# Patient Record
Sex: Female | Born: 1990
Health system: Southern US, Community
[De-identification: ages and names within clinical notes are randomized; demographics above are authoritative.]

## PROBLEM LIST (undated history)

## (undated) DIAGNOSIS — T8859XA Other complications of anesthesia, initial encounter: Secondary | ICD-10-CM

## (undated) DIAGNOSIS — Z8619 Personal history of other infectious and parasitic diseases: Secondary | ICD-10-CM

## (undated) DIAGNOSIS — R599 Enlarged lymph nodes, unspecified: Secondary | ICD-10-CM

## (undated) DIAGNOSIS — I319 Disease of pericardium, unspecified: Secondary | ICD-10-CM

## (undated) DIAGNOSIS — K219 Gastro-esophageal reflux disease without esophagitis: Secondary | ICD-10-CM

## (undated) DIAGNOSIS — E559 Vitamin D deficiency, unspecified: Secondary | ICD-10-CM

## (undated) DIAGNOSIS — Z9889 Other specified postprocedural states: Secondary | ICD-10-CM

## (undated) DIAGNOSIS — Z973 Presence of spectacles and contact lenses: Secondary | ICD-10-CM

## (undated) DIAGNOSIS — M549 Dorsalgia, unspecified: Secondary | ICD-10-CM

## (undated) DIAGNOSIS — M545 Low back pain, unspecified: Secondary | ICD-10-CM

## (undated) DIAGNOSIS — N200 Calculus of kidney: Secondary | ICD-10-CM

## (undated) DIAGNOSIS — G43909 Migraine, unspecified, not intractable, without status migrainosus: Secondary | ICD-10-CM

## (undated) DIAGNOSIS — N979 Female infertility, unspecified: Secondary | ICD-10-CM

## (undated) DIAGNOSIS — N912 Amenorrhea, unspecified: Secondary | ICD-10-CM

## (undated) DIAGNOSIS — Z87442 Personal history of urinary calculi: Secondary | ICD-10-CM

## (undated) DIAGNOSIS — E282 Polycystic ovarian syndrome: Secondary | ICD-10-CM

## (undated) DIAGNOSIS — R591 Generalized enlarged lymph nodes: Secondary | ICD-10-CM

## (undated) HISTORY — DX: Amenorrhea, unspecified: N91.2

## (undated) HISTORY — DX: Migraine, unspecified, not intractable, without status migrainosus: G43.909

## (undated) HISTORY — PX: HYSTEROSCOPY WITH D & C: SHX1775

## (undated) HISTORY — DX: Polycystic ovarian syndrome: E28.2

## (undated) HISTORY — DX: Other complications of anesthesia, initial encounter: T88.59XA

## (undated) HISTORY — DX: Morbid (severe) obesity due to excess calories: E66.01

## (undated) HISTORY — DX: Other specified postprocedural states: Z98.890

## (undated) HISTORY — DX: Disease of pericardium, unspecified: I31.9

## (undated) HISTORY — DX: Dorsalgia, unspecified: M54.9

## (undated) HISTORY — DX: Vitamin D deficiency, unspecified: E55.9

## (undated) HISTORY — DX: Calculus of kidney: N20.0

## (undated) HISTORY — DX: Female infertility, unspecified: N97.9

## (undated) HISTORY — PX: OTHER SURGICAL HISTORY: SHX169

## (undated) HISTORY — DX: Gastro-esophageal reflux disease without esophagitis: K21.9

## (undated) HISTORY — DX: Personal history of other infectious and parasitic diseases: Z86.19

## (undated) HISTORY — PX: WISDOM TOOTH EXTRACTION: SHX21

---

## 2014-10-25 ENCOUNTER — Ambulatory Visit (INDEPENDENT_AMBULATORY_CARE_PROVIDER_SITE_OTHER): Payer: BC Managed Care – PPO | Admitting: Urgent Care

## 2014-10-25 VITALS — BP 130/78 | HR 107 | Temp 98.6°F | Resp 16 | Ht 63.75 in | Wt 230.8 lb

## 2014-10-25 DIAGNOSIS — J029 Acute pharyngitis, unspecified: Secondary | ICD-10-CM

## 2014-10-25 DIAGNOSIS — R000 Tachycardia, unspecified: Secondary | ICD-10-CM

## 2014-10-25 DIAGNOSIS — J309 Allergic rhinitis, unspecified: Secondary | ICD-10-CM

## 2014-10-25 LAB — POCT CBC
Granulocyte percent: 57.1 %G (ref 37–80)
HCT, POC: 45.2 % (ref 37.7–47.9)
Hemoglobin: 14.7 g/dL (ref 12.2–16.2)
Lymph, poc: 1.3 (ref 0.6–3.4)
MCH, POC: 28.2 pg (ref 27–31.2)
MCHC: 32.6 g/dL (ref 31.8–35.4)
MCV: 86.7 fL (ref 80–97)
MID (cbc): 0.4 (ref 0–0.9)
MPV: 6.5 fL (ref 0–99.8)
POC Granulocyte: 2.2 (ref 2–6.9)
POC LYMPH PERCENT: 32.9 %L (ref 10–50)
POC MID %: 10 %M (ref 0–12)
Platelet Count, POC: 205 10*3/uL (ref 142–424)
RBC: 5.22 M/uL (ref 4.04–5.48)
RDW, POC: 13.8 %
WBC: 3.9 10*3/uL — AB (ref 4.6–10.2)

## 2014-10-25 LAB — POCT RAPID STREP A (OFFICE): Rapid Strep A Screen: NEGATIVE

## 2014-10-25 MED ORDER — CETIRIZINE HCL 10 MG PO TABS: 10.0000 mg | ORAL_TABLET | Freq: Every day | ORAL | Status: DC

## 2014-10-25 MED ORDER — TRIAMCINOLONE ACETONIDE 55 MCG/ACT NA AERO: 2.0000 | INHALATION_SPRAY | Freq: Every day | NASAL | Status: DC

## 2014-10-25 NOTE — Patient Instructions (Signed)

## 2014-10-25 NOTE — Progress Notes (Addendum)
MRN: [id number] DOB: 1991-08-21  Subjective:   Jacqueline [name] is a 23 y.o. female presenting for 1 week history of sore/scratchy throat. Patient states that she feels like there is phlegm stuck in her throat. Has had ear fullness, neck soreness/stiffness L>R in the past few days; this morning felt feverish, left ear pain. She has been using Dayquil, Nyquil, Mucinex daily as suggested by her pharmacist, she is concerned now since she is not getting relief and pharmacist suggested she come in to get an antibiotic. Denies sinus congestion, sinus pain, chest pain, chest tightness, cough, shob, wheezing; has had some nausea but no vomiting. Has had 1 sick contact, her boyfriend who has similar symptoms. Denies pmh of seasonal allergies but has used flonase in the past as recommended by her previous PCP, denies hx of asthma. 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 Denies smoking, occasional alcohol drink. Denies any other aggravating or relieving factors, no other questions or concerns.  Jacqueline [name] has a current medication list which includes the following prescription(s): levonorgestrel-ethinyl estradiol.  She has No Known Allergies.  Jacqueline [name]  has no past medical history on file. Also  has no past surgical history on file.  ROS As in subjective.  Objective:   Vitals: BP 130/78 mmHg  Pulse 107  Temp(Src) 98.6 F (37 C) (Oral)  Resp 16  Ht 5' 3.75" (1.619 m)  Wt 230 lb 12.8 oz (104.69 kg)  BMI 39.94 kg/m2  SpO2 98%  LMP 09/25/2014  Physical Exam  Constitutional: She is oriented to person, place, and time and well-developed, well-nourished, and in no distress.  HENT:  TM's pearly and intact bilaterally, no effusions or erythema. Nasal turbinates inflamed and edematous, mild clear rhinorrhea. Mild postnasal drip but without oropharyngeal or tonsillar exudates.  Eyes: Conjunctivae and EOM are normal. Pupils are equal, round, and  reactive to light. Right eye exhibits no discharge. Left eye exhibits no discharge. No scleral icterus.  Neck: Normal range of motion. Neck supple. No thyromegaly present.  Negative Kernig, Brudzinski sign  Cardiovascular: Regular rhythm, normal heart sounds and intact distal pulses.  Tachycardia present.  Exam reveals no gallop and no friction rub.   No murmur heard. Pulmonary/Chest: Effort normal and breath sounds normal. No stridor. No respiratory distress. She has no wheezes. She has no rales. She exhibits no tenderness.  Lymphadenopathy:    She has cervical adenopathy (Left anterior).  Neurological: She is alert and oriented to person, place, and time.  Skin: Skin is warm and dry. No rash noted. She is not diaphoretic. No erythema.  Psychiatric: Mood and affect normal.   Results for orders placed or performed in visit on 10/25/14 (from the past 24 hour(s))  POCT rapid strep A     Status: None   Collection Time: 10/25/14 11:03 AM  Result Value Ref Range   Rapid Strep A Screen Negative Negative  POCT CBC     Status: Abnormal   Collection Time: 10/25/14 11:03 AM  Result Value Ref Range   WBC 3.9 (A) 4.6 - 10.2 K/uL   Lymph, poc 1.3 0.6 - 3.4   POC LYMPH PERCENT 32.9 10 - 50 %L   MID (cbc) 0.4 0 - 0.9   POC MID % 10.0 0 - 12 %M   POC Granulocyte 2.2 2 - 6.9   Granulocyte percent 57.1 37 - 80 %G   RBC 5.22 4.04 - 5.48 M/uL   Hemoglobin 14.7  12.2 - 16.2 g/dL   HCT, POC 16.145.2 09.637.7 - 47.9 %   MCV 86.7 80 - 97 fL   MCH, POC 28.2 27 - 31.2 pg   MCHC 32.6 31.8 - 35.4 g/dL   RDW, POC 04.513.8 %   Platelet Count, POC 205 142 - 424 K/uL   MPV 6.5 0 - 99.8 fL    Assessment and Plan :   1. Sore throat 2. Tachycardia - Not concerning for infectious process, advised to return to clinic if no improvement within 5 days or worsening symptoms - POCT rapid strep A - POCT CBC - Culture, Group A Strep pending  3. Allergic rhinitis, unspecified allergic rhinitis type - Will try to control  allergies for post-nasal drip - triamcinolone (NASACORT AQ) 55 MCG/ACT AERO nasal inhaler; Place 2 sprays into the nose daily.  Dispense: 1 Inhaler; Refill: 12 - cetirizine (ZYRTEC) 10 MG tablet; Take 1 tablet (10 mg total) by mouth daily.  Dispense: 30 tablet; Refill: 11   Wallis BambergMario Mani, PA-C [facility] [phone] 10/25/2014 11:05 AM

## 2014-10-26 ENCOUNTER — Encounter: Payer: Self-pay | Admitting: Urgent Care

## 2014-10-29 LAB — CULTURE, GROUP A STREP

## 2014-10-30 ENCOUNTER — Ambulatory Visit (INDEPENDENT_AMBULATORY_CARE_PROVIDER_SITE_OTHER): Payer: BC Managed Care – PPO | Admitting: Emergency Medicine

## 2014-10-30 VITALS — BP 116/74 | HR 119 | Temp 99.1°F | Resp 20 | Ht 63.0 in | Wt 231.0 lb

## 2014-10-30 DIAGNOSIS — S335XXA Sprain of ligaments of lumbar spine, initial encounter: Secondary | ICD-10-CM

## 2014-10-30 DIAGNOSIS — J0140 Acute pansinusitis, unspecified: Secondary | ICD-10-CM

## 2014-10-30 DIAGNOSIS — L049 Acute lymphadenitis, unspecified: Secondary | ICD-10-CM

## 2014-10-30 MED ORDER — PSEUDOEPHEDRINE-GUAIFENESIN ER 60-600 MG PO TB12
1.0000 | ORAL_TABLET | Freq: Two times a day (BID) | ORAL | Status: DC
Start: 2014-10-30 — End: 2014-11-30

## 2014-10-30 MED ORDER — AMOXICILLIN-POT CLAVULANATE 875-125 MG PO TABS: 1.0000 | ORAL_TABLET | Freq: Two times a day (BID) | ORAL | Status: DC

## 2014-10-30 MED ORDER — NAPROXEN SODIUM 550 MG PO TABS: 550.0000 mg | ORAL_TABLET | Freq: Two times a day (BID) | ORAL | Status: DC

## 2014-10-30 MED ORDER — CYCLOBENZAPRINE HCL 10 MG PO TABS: 10.0000 mg | ORAL_TABLET | Freq: Three times a day (TID) | ORAL | Status: DC | PRN

## 2014-10-30 NOTE — Patient Instructions (Signed)
Sinusitis Sinusitis is redness, soreness, and inflammation of the paranasal sinuses. Paranasal sinuses are air pockets within the bones of your face (beneath the eyes, the middle of the forehead, or above the eyes). In healthy paranasal sinuses, mucus is able to drain out, and air is able to circulate through them by way of your nose. However, when your paranasal sinuses are inflamed, mucus and air can become trapped. This can allow bacteria and other germs to grow and cause infection. Sinusitis can develop quickly and last only a short time (acute) or continue over a long period (chronic). Sinusitis that lasts for more than 12 weeks is considered chronic.  CAUSES  Causes of sinusitis include:  Allergies.  Structural abnormalities, such as displacement of the cartilage that separates your nostrils (deviated septum), which can decrease the air flow through your nose and sinuses and affect sinus drainage.  Functional abnormalities, such as when the small hairs (cilia) that line your sinuses and help remove mucus do not work properly or are not present. SIGNS AND SYMPTOMS  Symptoms of acute and chronic sinusitis are the same. The primary symptoms are pain and pressure around the affected sinuses. Other symptoms include:  Upper toothache.  Earache.  Headache.  Bad breath.  Decreased sense of smell and taste.  A cough, which worsens when you are lying flat.  Fatigue.  Fever.  Thick drainage from your nose, which often is green and may contain pus (purulent).  Swelling and warmth over the affected sinuses. DIAGNOSIS  Your health care provider will perform a physical exam. During the exam, your health care provider may:  Look in your nose for signs of abnormal growths in your nostrils (nasal polyps).  Tap over the affected sinus to check for signs of infection.  View the inside of your sinuses (endoscopy) using an imaging device that has a light attached (endoscope). If your health  care provider suspects that you have chronic sinusitis, one or more of the following tests may be recommended:  Allergy tests.  Nasal culture. A sample of mucus is taken from your nose, sent to a lab, and screened for bacteria.  Nasal cytology. A sample of mucus is taken from your nose and examined by your health care provider to determine if your sinusitis is related to an allergy. TREATMENT  Most cases of acute sinusitis are related to a viral infection and will resolve on their own within 10 days. Sometimes medicines are prescribed to help relieve symptoms (pain medicine, decongestants, nasal steroid sprays, or saline sprays).  However, for sinusitis related to a bacterial infection, your health care provider will prescribe antibiotic medicines. These are medicines that will help kill the bacteria causing the infection.  Rarely, sinusitis is caused by a fungal infection. In theses cases, your health care provider will prescribe antifungal medicine. For some cases of chronic sinusitis, surgery is needed. Generally, these are cases in which sinusitis recurs more than 3 times per year, despite other treatments. HOME CARE INSTRUCTIONS   Drink plenty of water. Water helps thin the mucus so your sinuses can drain more easily.  Use a humidifier.  Inhale steam 3 to 4 times a day (for example, sit in the bathroom with the shower running).  Apply a warm, moist washcloth to your face 3 to 4 times a day, or as directed by your health care provider.  Use saline nasal sprays to help moisten and clean your sinuses.  Take medicines only as directed by your health care provider.    If you were prescribed either an antibiotic or antifungal medicine, finish it all even if you start to feel better. SEEK IMMEDIATE MEDICAL CARE IF:  You have increasing pain or severe headaches.  You have nausea, vomiting, or drowsiness.  You have swelling around your face.  You have vision problems.  You have a stiff  neck.  You have difficulty breathing. MAKE SURE YOU:   Understand these instructions.  Will watch your condition.  Will get help right away if you are not doing well or get worse. Document Released: 10/27/2005 Document Revised: 03/13/2014 Document Reviewed: 11/11/2011 [facility] Patient Information 2015 [place], [place]. This information is not intended to replace advice given to you by your health care provider. Make sure you discuss any questions you have with your health care provider. Swollen Lymph Nodes The lymphatic system filters fluid from around cells. It is like a system of blood vessels. These channels carry lymph instead of blood. The lymphatic system is an important part of the immune (disease fighting) system. When people talk about "swollen glands in the neck," they are usually talking about swollen lymph nodes. The lymph nodes are like the little traps for infection. You and your caregiver may be able to feel lymph nodes, especially swollen nodes, in these common areas: the groin (inguinal area), armpits (axilla), and above the clavicle (supraclavicular). You may also feel them in the neck (cervical) and the back of the head just above the hairline (occipital). Swollen glands occur when there is any condition in which the body responds with an allergic type of reaction. For instance, the glands in the neck can become swollen from insect bites or any type of minor infection on the head. These are very noticeable in children with only minor problems. Lymph nodes may also become swollen when there is a tumor or problem with the lymphatic system, such as Hodgkin's disease. TREATMENT   Most swollen glands do not require treatment. They can be observed (watched) for a short period of time, if your caregiver feels it is necessary. Most of the time, observation is not necessary.  Antibiotics (medicines that kill germs) may be prescribed by your caregiver. Your caregiver may prescribe these if  he or she feels the swollen glands are due to a bacterial (germ) infection. Antibiotics are not used if the swollen glands are caused by a virus. HOME CARE INSTRUCTIONS   Take medications as directed by your caregiver. Only take over-the-counter or prescription medicines for pain, discomfort, or fever as directed by your caregiver. SEEK MEDICAL CARE IF:   If you begin to run a temperature greater than 102 F (38.9 C), or as your caregiver suggests. MAKE SURE YOU:   Understand these instructions.  Will watch your condition.  Will get help right away if you are not doing well or get worse. Document Released: 10/17/2002 Document Revised: 01/19/2012 Document Reviewed: 10/27/2005 [facility] Patient Information 2015 [place], [place]. This information is not intended to replace advice given to you by your health care provider. Make sure you discuss any questions you have with your health care provider.

## 2014-10-30 NOTE — Progress Notes (Signed)
[facility] [address], [place] [address] [phone]  Date:  10/30/2014   Name:  Jacqueline [name]   DOB:  1991-11-07   MRN:  [id number]  PCP:  No PCP Per Patient    Chief Complaint: Follow-up   History of Present Illness:  Jacqueline [name] is a 23 y.o. very pleasant female patient who presents with the following:  Ill for a week with malaise and nasal congestion and a cough that is  not productive Post nasal drainage that is purulent.   There is no wheezing or shortness of breath. Fever with no chills Seen last week and not improved.  Says worse.  Treated for allergies. No nausea or vomiting.  No stool change No rash  Tender nodes.  Night sweats.  No weight loss Has non radiating low back pain with no history of injury or overuse No improvement with over the counter medications or other home remedies.  Denies other complaint or health concern today. .  There are no active problems to display for this patient.   History reviewed. No pertinent past medical history.  History reviewed. No pertinent past surgical history.  History  Substance Use Topics  . Smoking status: Never Smoker   . Smokeless tobacco: Never Used  . Alcohol Use: No    Family History  Problem Relation Age of Onset  . Diabetes Mother   . Hypertension Mother   . Cancer Maternal Grandmother   . Diabetes Paternal Grandmother   . Heart disease Paternal Grandmother   . Heart disease Paternal Grandfather     No Known Allergies  Medication list has been reviewed and updated.  Current Outpatient Prescriptions on File Prior to Visit  Medication Sig Dispense Refill  . levonorgestrel-ethinyl estradiol (AVIANE,ALESSE,LESSINA) 0.1-20 MG-MCG tablet Take 1 tablet by mouth daily.    . cetirizine (ZYRTEC) 10 MG tablet Take 1 tablet (10 mg total) by mouth daily. (Patient not taking: Reported on 10/30/2014) 30 tablet 11  . triamcinolone (NASACORT AQ) 55 MCG/ACT AERO nasal inhaler  Place 2 sprays into the nose daily. (Patient not taking: Reported on 10/30/2014) 1 Inhaler 12   No current facility-administered medications on file prior to visit.    Review of Systems:  As per HPI, otherwise negative.    Physical Examination: Filed Vitals:   10/30/14 1954  BP: 116/74  Pulse: 119  Temp: 99.1 F (37.3 C)  Resp: 20   Filed Vitals:   10/30/14 1954  Height: 5\' 3"  (1.6 m)  Weight: 231 lb (104.781 kg)   Body mass index is 40.93 kg/(m^2). Ideal Body Weight: Weight in (lb) to have BMI = 25: 140.8  GEN: obese , NAD, Non-toxic, A & O x 3 HEENT: Atraumatic, Normocephalic. Neck supple. No masses, moderate left anterior cervical and posterior cervical nodes.  tender Ears and Nose: No external deformity. CV: RRR, No M/G/R. No JVD. No thrill. No extra heart sounds. PULM: CTA B, no wheezes, crackles, rhonchi. No retractions. No resp. distress. No accessory muscle use. ABD: S, NT, ND, +BS. No rebound. No HSM. EXTR: No c/c/e NEURO Normal gait.  PSYCH: Normally interactive. Conversant. Not depressed or anxious appearing.  Calm demeanor.  Back:  Tender lumbar paravertebral region bilaterally.  No ecchymosis   Assessment and Plan: Lymphadenopathy Sinusitis augmentin mucinex d Follow up in two weeks if nodes not resolved Back strain Anaprox Flexeril  Signed,  Phillips OdorJeffery Anderson, MD

## 2014-10-31 ENCOUNTER — Telehealth: Payer: Self-pay

## 2014-10-31 NOTE — Telephone Encounter (Signed)
Patient missed a call and there was no vm left

## 2014-10-31 NOTE — Telephone Encounter (Signed)
No documentation of call made to pt.

## 2014-11-02 ENCOUNTER — Telehealth: Payer: Self-pay

## 2014-11-02 MED ORDER — FLUTICASONE PROPIONATE 50 MCG/ACT NA SUSP: 2.0000 | Freq: Every day | NASAL | Status: DC

## 2014-11-02 NOTE — Telephone Encounter (Signed)
Will switch to fluticasone. Please notify patient that was sent.  Wallis BambergMario Mani, PA-C [facility] [phone] 11/02/2014  2:46 PM

## 2014-11-02 NOTE — Telephone Encounter (Signed)
[facility]ed req to change NS to alternate d/t manufacturer backorder on triamcinolone NS.

## 2014-11-07 ENCOUNTER — Ambulatory Visit (INDEPENDENT_AMBULATORY_CARE_PROVIDER_SITE_OTHER): Payer: BC Managed Care – PPO | Admitting: Emergency Medicine

## 2014-11-07 VITALS — BP 122/76 | HR 94 | Temp 97.7°F | Resp 16 | Ht 64.0 in | Wt 229.4 lb

## 2014-11-07 DIAGNOSIS — R221 Localized swelling, mass and lump, neck: Secondary | ICD-10-CM

## 2014-11-07 LAB — POCT CBC
Granulocyte percent: 18.7 %G — AB (ref 37–80)
HCT, POC: 43.3 % (ref 37.7–47.9)
Hemoglobin: 14 g/dL (ref 12.2–16.2)
Lymph, poc: 12.3 — AB (ref 0.6–3.4)
MCH, POC: 27.9 pg (ref 27–31.2)
MCHC: 32.4 g/dL (ref 31.8–35.4)
MCV: 86 fL (ref 80–97)
MID (cbc): 2.6 — AB (ref 0–0.9)
MPV: 7.4 fL (ref 0–99.8)
POC Granulocyte: 3.4 (ref 2–6.9)
POC LYMPH PERCENT: 37 %L (ref 10–50)
POC MID %: 14.3 %M — AB (ref 0–12)
Platelet Count, POC: 200 10*3/uL (ref 142–424)
RBC: 5.03 M/uL (ref 4.04–5.48)
RDW, POC: 15 %
WBC: 18.4 10*3/uL — AB (ref 4.6–10.2)

## 2014-11-07 LAB — COMPREHENSIVE METABOLIC PANEL
ALT: 134 U/L — ABNORMAL HIGH (ref 0–35)
AST: 100 U/L — ABNORMAL HIGH (ref 0–37)
Albumin: 3.9 g/dL (ref 3.5–5.2)
Alkaline Phosphatase: 185 U/L — ABNORMAL HIGH (ref 39–117)
BUN: 6 mg/dL (ref 6–23)
CO2: 23 mEq/L (ref 19–32)
Calcium: 9.4 mg/dL (ref 8.4–10.5)
Chloride: 102 mEq/L (ref 96–112)
Creat: 0.65 mg/dL (ref 0.50–1.10)
Glucose, Bld: 86 mg/dL (ref 70–99)
Potassium: 4.1 mEq/L (ref 3.5–5.3)
Sodium: 138 mEq/L (ref 135–145)
Total Bilirubin: 0.6 mg/dL (ref 0.2–1.2)
Total Protein: 7.5 g/dL (ref 6.0–8.3)

## 2014-11-07 LAB — CALCIUM: Calcium: 9.4 mg/dL (ref 8.4–10.5)

## 2014-11-07 LAB — LACTATE DEHYDROGENASE: LDH: 665 U/L — ABNORMAL HIGH (ref 94–250)

## 2014-11-07 MED ORDER — HYDROCODONE-ACETAMINOPHEN 5-325 MG PO TABS: 1.0000 | ORAL_TABLET | ORAL | Status: DC | PRN

## 2014-11-07 MED ORDER — ONDANSETRON 8 MG PO TBDP: 8.0000 mg | ORAL_TABLET | Freq: Three times a day (TID) | ORAL | Status: DC | PRN

## 2014-11-07 NOTE — Addendum Note (Signed)
Addended by: Carmelina DaneANDERSON, JEFFERY S on: 11/07/2014 03:31 PM   Modules accepted: Orders

## 2014-11-07 NOTE — Telephone Encounter (Signed)
Pt.notified

## 2014-11-07 NOTE — Progress Notes (Addendum)
[facility] [address], [place] [address] [phone]  Date:  11/07/2014   Name:  Jacqueline [name]   DOB:  06-Sep-1991   MRN:  [id number]  PCP:  No PCP Per Patient    Chief Complaint: Follow-up; Night Sweats; and Swollen lymphnodes   History of Present Illness:  Jacqueline [name] is a 23 y.o. very pleasant female patient who presents with the following:  Seen last week with swollen matted tender left cervical nodes Given augmentin with no improvement Still having night sweats Anorexia.  Nausea but no vomiting Enlarged nodes and increasingly more tender. No difficult swallowing.  Works in pharmacy No cats No fever or chills Grandmother died of NHL at age 25 No improvement with over the counter medications or other home remedies. Denies other complaint or health concern today.   There are no active problems to display for this patient.   History reviewed. No pertinent past medical history.  History reviewed. No pertinent past surgical history.  History  Substance Use Topics  . Smoking status: Never Smoker   . Smokeless tobacco: Never Used  . Alcohol Use: No    Family History  Problem Relation Age of Onset  . Diabetes Mother   . Hypertension Mother   . Cancer Maternal Grandmother   . Diabetes Paternal Grandmother   . Heart disease Paternal Grandmother   . Heart disease Paternal Grandfather     No Known Allergies  Medication list has been reviewed and updated.  Current Outpatient Prescriptions on File Prior to Visit  Medication Sig Dispense Refill  . levonorgestrel-ethinyl estradiol (AVIANE,ALESSE,LESSINA) 0.1-20 MG-MCG tablet Take 1 tablet by mouth daily.    Marland Kitchen. amoxicillin-clavulanate (AUGMENTIN) 875-125 MG per tablet Take 1 tablet by mouth 2 (two) times daily. (Patient not taking: Reported on 11/07/2014) 20 tablet 0  . cetirizine (ZYRTEC) 10 MG tablet Take 1 tablet (10 mg total) by mouth daily. (Patient not taking: Reported on  10/30/2014) 30 tablet 11  . cyclobenzaprine (FLEXERIL) 10 MG tablet Take 1 tablet (10 mg total) by mouth 3 (three) times daily as needed for muscle spasms. (Patient not taking: Reported on 11/07/2014) 30 tablet 0  . fluticasone (FLONASE) 50 MCG/ACT nasal spray Place 2 sprays into both nostrils daily. (Patient not taking: Reported on 11/07/2014) 16 g 11  . naproxen sodium (ANAPROX DS) 550 MG tablet Take 1 tablet (550 mg total) by mouth 2 (two) times daily with a meal. (Patient not taking: Reported on 11/07/2014) 40 tablet 0  . pseudoephedrine-guaifenesin (MUCINEX D) 60-600 MG per tablet Take 1 tablet by mouth every 12 (twelve) hours. (Patient not taking: Reported on 11/07/2014) 18 tablet 0  . triamcinolone (NASACORT AQ) 55 MCG/ACT AERO nasal inhaler Place 2 sprays into the nose daily. (Patient not taking: Reported on 10/30/2014) 1 Inhaler 12   No current facility-administered medications on file prior to visit.    Review of Systems:  As per HPI, otherwise negative.    Physical Examination: Filed Vitals:   11/07/14 1317  BP: 122/76  Pulse: 94  Temp: 97.7 F (36.5 C)  Resp: 16   Filed Vitals:   11/07/14 1317  Height: 5\' 4"  (1.626 m)  Weight: 229 lb 6.4 oz (104.055 kg)   Body mass index is 39.36 kg/(m^2). Ideal Body Weight: Weight in (lb) to have BMI = 25: 145.3   GEN: WDWN, NAD, Non-toxic, Alert & Oriented x 3 HEENT: Atraumatic, Normocephalic.  Ears and Nose: No external deformity. Neck;  Nodes enlarged and  more tender EXTR: No clubbing/cyanosis/edema NEURO: Normal gait.  PSYCH: Normally interactive. Conversant. Not depressed or anxious appearing.  Calm demeanor.    Assessment and Plan: Cervical lymphadenopathy Possible NHL Scan Labs ENT referral  Signed,  Phillips OdorJeffery Anderson, MD

## 2014-11-08 ENCOUNTER — Ambulatory Visit
Admission: RE | Admit: 2014-11-08 | Discharge: 2014-11-08 | Disposition: A | Discharge status: Home / Self Care | Payer: BC Managed Care – PPO | Source: Ambulatory Visit | Attending: Emergency Medicine | Admitting: Emergency Medicine

## 2014-11-08 DIAGNOSIS — R221 Localized swelling, mass and lump, neck: Secondary | ICD-10-CM

## 2014-11-08 NOTE — Addendum Note (Signed)
Addended by: Carmelina DaneANDERSON, JEFFERY S on: 11/08/2014 03:27 PM   Modules accepted: Orders

## 2014-11-10 DIAGNOSIS — Z8619 Personal history of other infectious and parasitic diseases: Secondary | ICD-10-CM

## 2014-11-10 DIAGNOSIS — R599 Enlarged lymph nodes, unspecified: Secondary | ICD-10-CM

## 2014-11-10 HISTORY — DX: Personal history of other infectious and parasitic diseases: Z86.19

## 2014-11-10 HISTORY — DX: Enlarged lymph nodes, unspecified: R59.9

## 2014-11-13 ENCOUNTER — Telehealth: Payer: Self-pay | Admitting: Oncology

## 2014-11-13 NOTE — Telephone Encounter (Signed)
S/W PATIENT AND GAVE NP APPT [facility] 01/12 @ 10:30 W/DR. SHADAD.  REFERRING DR. Thornton Papas DX- NECK MASS

## 2014-11-15 ENCOUNTER — Ambulatory Visit
Admission: RE | Admit: 2014-11-15 | Discharge: 2014-11-15 | Disposition: A | Discharge status: Home / Self Care | Payer: BLUE CROSS/BLUE SHIELD | Source: Ambulatory Visit | Attending: Emergency Medicine | Admitting: Emergency Medicine

## 2014-11-15 DIAGNOSIS — R221 Localized swelling, mass and lump, neck: Secondary | ICD-10-CM

## 2014-11-15 MED ORDER — IOHEXOL 300 MG/ML  SOLN
75.0000 mL | Freq: Once | INTRAMUSCULAR | Status: AC | PRN
  Administered 2014-11-15: 75 mL via INTRAVENOUS

## 2014-11-16 ENCOUNTER — Telehealth: Payer: Self-pay

## 2014-11-16 ENCOUNTER — Other Ambulatory Visit: Payer: Self-pay | Admitting: Emergency Medicine

## 2014-11-16 DIAGNOSIS — R221 Localized swelling, mass and lump, neck: Secondary | ICD-10-CM

## 2014-11-16 NOTE — Telephone Encounter (Signed)
I have called and talked to the patient and answered her questions.

## 2014-11-16 NOTE — Telephone Encounter (Signed)
Jacqueline [name], pt has a lot of questions about her diagnosis. Can you please give her a call when you get a second. I have referrals working on her biopsy now to try and get it done for her tomorrow or Monday. Thanks so much.

## 2014-11-17 ENCOUNTER — Telehealth: Payer: Self-pay

## 2014-11-17 NOTE — Telephone Encounter (Signed)
The patient called to ask about the biopsy that she needs to have done.  She has questions about the procedure.  Please advise.  Thank you.  CB#: [phone]

## 2014-11-18 MED ORDER — LORAZEPAM 1 MG PO TABS: 1.0000 mg | ORAL_TABLET | Freq: Two times a day (BID) | ORAL | Status: DC | PRN

## 2014-11-18 NOTE — Telephone Encounter (Signed)
Patient stated she was returning a call to Constellation EnergyChelle Jeffrey. Patients call back number is [phone]

## 2014-11-18 NOTE — Telephone Encounter (Signed)
Lots of questions about the US guided biopsy of the neck mass scheduled for 11/23/2014 (Thursday).   She sees Dr. Clelia CroftShadad on 11/21/2014 (Tuesday).  Extremely anxious.  Hydrocodone isn't helping, and is causing itching.  D/C Norco. Encouraged her to try the cyclobenzaprine previously ordered. Contact me tomorrow or the next day if she'd like to try oxycodone.  Meds ordered this encounter  Medications  . LORazepam (ATIVAN) 1 MG tablet    Sig: Take 1 tablet (1 mg total) by mouth 2 (two) times daily as needed for anxiety.    Dispense:  20 tablet    Refill:  0    Order Specific Question:  Supervising Provider    Answer:  DOOLITTLE, ROBERT P [[id number]]

## 2014-11-18 NOTE — Telephone Encounter (Signed)
Chelle can you please call this pt when you have a chance. I'm not sure I'll be able to answer her questions. Thanks so much

## 2014-11-20 ENCOUNTER — Telehealth: Payer: Self-pay

## 2014-11-20 ENCOUNTER — Telehealth: Payer: Self-pay | Admitting: Oncology

## 2014-11-20 NOTE — Telephone Encounter (Signed)
PATIENT CALLED TO R/S NP APPT TO 01/21 @ 10:30 W/DR. SHADAD.

## 2014-11-20 NOTE — Telephone Encounter (Signed)
°  FMLA ppw in Dr. Ewell PoeAnderson's box for completion in 5-7 business days. Please return to Disability box at 102 check out once Complete. Jasmine or myself will scan completed forms into epic, fax to [place] [employer], and contact patient.

## 2014-11-21 ENCOUNTER — Ambulatory Visit: Payer: BC Managed Care – PPO | Admitting: Oncology

## 2014-11-21 ENCOUNTER — Other Ambulatory Visit: Payer: BC Managed Care – PPO

## 2014-11-21 ENCOUNTER — Telehealth: Payer: Self-pay

## 2014-11-21 ENCOUNTER — Ambulatory Visit: Payer: BC Managed Care – PPO

## 2014-11-21 NOTE — Telephone Encounter (Signed)
Pt states she still have cough and chest cold, would like to know what else can she do to get rid of it. Please call [phone]     [facility] [address]

## 2014-11-22 ENCOUNTER — Other Ambulatory Visit: Payer: Self-pay | Admitting: Radiology

## 2014-11-22 NOTE — Telephone Encounter (Signed)
Robitussin dm

## 2014-11-22 NOTE — Telephone Encounter (Signed)
Left message for pt to c/b 

## 2014-11-22 NOTE — Telephone Encounter (Signed)
Spoke to pt, she was not evaluated for this at her last OV. i explained to her she will need to RTC for evaluation.  She would like DR. Anderson to review this message for care of cough.  Please asvise

## 2014-11-23 ENCOUNTER — Ambulatory Visit (HOSPITAL_COMMUNITY)
Admission: RE | Admit: 2014-11-23 | Discharge: 2014-11-23 | Disposition: A | Discharge status: Home / Self Care | Payer: BLUE CROSS/BLUE SHIELD | Source: Ambulatory Visit | Attending: Emergency Medicine | Admitting: Emergency Medicine

## 2014-11-23 DIAGNOSIS — R221 Localized swelling, mass and lump, neck: Secondary | ICD-10-CM | POA: Diagnosis not present

## 2014-11-23 MED ORDER — LIDOCAINE HCL (PF) 1 % IJ SOLN
INTRAMUSCULAR | Status: AC
  Filled 2014-11-23: qty 10

## 2014-11-23 NOTE — Procedures (Signed)
US core L cervical LAN 18g x6 to surg path in saline No complication No blood loss. [facility].

## 2014-11-23 NOTE — Telephone Encounter (Signed)
Left detailed message on vm for pt to use as directed

## 2014-11-24 ENCOUNTER — Telehealth: Payer: Self-pay

## 2014-11-24 NOTE — Telephone Encounter (Signed)
As of noon today.  No path results back yet

## 2014-11-24 NOTE — Telephone Encounter (Signed)
Pt called wanting [facility] know [facility] results [facility] She said she had filled out a Release of info. And we could leave a detailed message on her answering machine. Please advise at  [phone]

## 2014-11-24 NOTE — Telephone Encounter (Signed)
Please review. Thanks!  

## 2014-11-24 NOTE — Telephone Encounter (Signed)
Patients states that she called earlier and was on hold for 20 minutes to speak with a doctor and has wasted half of her day trying to get in touch with someone. Patient is very upset and states that she would like for detailed messages to be left on her phone while she is working so that she can know her results. She requested to speak with Dr. Katrinka BlazingSmith but is willing to speak with any doctor. Please call patient! [phone]

## 2014-11-25 NOTE — Telephone Encounter (Signed)
Spoke to the patient and answered questions regarding her biopsy and biopsy site.  She has an appt with oncology on 1/21.

## 2014-11-27 ENCOUNTER — Telehealth: Payer: Self-pay

## 2014-11-27 NOTE — Telephone Encounter (Signed)
Pathology report for lymph node biopsy is back.  Patient has called clinic multiple times today inquiring about results.  Reviewed results with Dr. Nilda SimmerKristi Smith.  Dr. Katrinka BlazingSmith advised it would be ok for me to review these results/recommendations with patient via phone.  While reviewing results with Ms. Corrieri, she claims that she feels the neck swelling is worsening.  [name] is unsure if this is due to the procedure or worsening lymphadenopathy.  Advised Ms. Corrieri I would forward this concern to Dr. Dareen PianoAnderson for advice.

## 2014-11-28 NOTE — Telephone Encounter (Signed)
Spoke with patient. She is doing well. She plans to see oncology on Thursday and will call me if she needs a referral to ENT for further evaluation.  Necks seems more swollen - but no problems with swallowing or breathing.  She does have a slight cold with a cough.

## 2014-11-28 NOTE — Telephone Encounter (Signed)
Apparently the biopsy path report DID NOT get sent to me.   It looks like the needle biopsy was benign. She has an appt with oncology this week  Please check on the ENT appt. That was requested

## 2014-11-28 NOTE — Telephone Encounter (Signed)
Pt refused appt with ENT when they called to schedule the appt.

## 2014-11-30 ENCOUNTER — Ambulatory Visit: Payer: BLUE CROSS/BLUE SHIELD

## 2014-11-30 ENCOUNTER — Encounter: Payer: Self-pay | Admitting: Oncology

## 2014-11-30 ENCOUNTER — Telehealth: Payer: Self-pay | Admitting: Oncology

## 2014-11-30 ENCOUNTER — Other Ambulatory Visit: Payer: Self-pay

## 2014-11-30 ENCOUNTER — Ambulatory Visit (HOSPITAL_BASED_OUTPATIENT_CLINIC_OR_DEPARTMENT_OTHER): Payer: BLUE CROSS/BLUE SHIELD | Admitting: Oncology

## 2014-11-30 VITALS — BP 123/61 | HR 62 | Temp 97.6°F | Resp 20 | Ht 63.5 in | Wt 233.3 lb

## 2014-11-30 DIAGNOSIS — D72820 Lymphocytosis (symptomatic): Secondary | ICD-10-CM

## 2014-11-30 DIAGNOSIS — R591 Generalized enlarged lymph nodes: Secondary | ICD-10-CM

## 2014-11-30 DIAGNOSIS — R599 Enlarged lymph nodes, unspecified: Secondary | ICD-10-CM

## 2014-11-30 NOTE — Progress Notes (Signed)
Please see consult note.  

## 2014-11-30 NOTE — Progress Notes (Signed)
Checked in new pt with no financial concerns prior to seeing the dr.  Pt has Raquel's card for any billing or insurance questions or concerns.  ° °

## 2014-11-30 NOTE — Telephone Encounter (Signed)
Gave avs & calendar for February. See referral notes. °

## 2014-11-30 NOTE — Consult Note (Signed)
Reason for Referral: Lymphadenopathy.   HPI: 24 year old woman native of [place] but currently lives in [place]. 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 She has a history of PCOS otherwise really no significant comorbid conditions. She was in her usual state of health until she presented with neck pain and lymphadenopathy. She also had a symptoms of sore throat and recurrent fevers. She was treated symptomatically with antibiotics and subsequently allergy medication without any resolution of her symptoms. She was evaluated at urgent care and underwent a CT scan of the neck which was obtained and and showed diffuse bilateral cervical lymphadenopathy. The largest individual lymph node measuring 19 mm in short axis of the left side. Right level lymph node measuring 12 mm. She underwent a percutaneous biopsy on 11/23/2014 without specific diagnosis made. The final pathology showed benign lymphoid tissues. Patient referred to me for further evaluation. Clinically, she is continuing to be symptomatic. She is reporting continues to have fluctuating enlarged bilateral lymphadenopathy. She is reporting low-grade fevers and sweats. She has some weight loss close to 5 pounds on decline in her by mouth intake. She will also have noted increase back pain throughout her spine upper and mid to lower spine. She did not report any neurological deficits. She did not report any changes in her bowel habits or urinary symptoms. 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 She still able to drive short distances and attends to activities of daily living. She lives alone and her family lives in [place]. She has a very close contact at lives in the [place] area.  She is not report any headaches, blurry vision, syncope or seizures. She does not report any other constitutional  symptoms she does report occasional pruritus. She does not report any chest pain, palpitation, orthopnea, leg edema. She does not report any shortness of breath, wheezing or hemoptysis. She does report nonproductive cough. She does not report any nausea, vomiting, abdominal pain, early satiety, constipation, diarrhea, hematochezia, melena or any changes in bowel habits. She did report some discoloration of her urine but has not reported any hematuria, dysuria or incontinence. She does not report any neurological deficits such as neuropathy or upper extremity weakness. She does not report any petechiae or easy bruisability. She does report occasional anxiety but no depression. Rest of her review of systems unremarkable.   Past Medical History  Diagnosis Date  . PCOS (polycystic ovarian syndrome)   :    Current outpatient prescriptions:  .  HYDROcodone-acetaminophen (NORCO/VICODIN) 5-325 MG per tablet, Take 1-2 tablets by mouth every 4 (four) hours as needed for moderate pain. Filled by Dr. Synetta Shadow, Disp: , Rfl:  .  levonorgestrel-ethinyl estradiol (AVIANE,ALESSE,LESSINA) 0.1-20 MG-MCG tablet, Take 1 tablet by mouth daily., Disp: , Rfl:  .  Naproxen (NAPROSYN PO), Take 1 tablet by mouth 2 (two) times daily as needed. Uses OTC Naprosyn., Disp: , Rfl:  .  ondansetron (ZOFRAN-ODT) 8 MG disintegrating tablet, Take 1 tablet (8 mg total) by mouth every 8 (eight) hours as needed for nausea., Disp: 30 tablet, Rfl: 0:  No Known Allergies:  Family History  Problem Relation Age of Onset  . Diabetes Mother   . Hypertension Mother   . Cancer Maternal Grandmother   . Diabetes Paternal Grandmother   . Heart disease Paternal Grandmother   . Heart disease Paternal Grandfather   :  History   Social History  . Marital Status: Single    Spouse Name: N/A    Number of Children: N/A  . Years of Education: N/A   Occupational History  . Not on file.   Social History Main Topics  . Smoking  status: Never Smoker   . Smokeless tobacco: Never Used  . Alcohol Use: No  . Drug Use: No  . Sexual Activity: Not on file   Other Topics Concern  . Not on file   Social History Narrative  :  Pertinent items are noted in HPI.  Exam: Blood pressure 123/61, pulse 62, temperature 97.6 F (36.4 C), temperature source Oral, resp. rate 20, height 5' 3.5" (1.613 m), weight 233 lb 4.8 oz (105.824 kg). General appearance: alert and cooperative Head: Normocephalic, without obvious abnormality Throat: lips, mucosa, and tongue normal; teeth and gums normal Neck: Palpable adenopathy noted bilaterally more left than right. Back: negative Resp: clear to auscultation bilaterally Chest wall: no tenderness Cardio: regular rate and rhythm, S1, S2 normal, no murmur, click, rub or gallop GI: soft, non-tender; bowel sounds normal; no masses,  no organomegaly Extremities: extremities normal, atraumatic, no cyanosis or edema Pulses: 2+ and symmetric Skin: Skin color, texture, turgor normal. No rashes or lesions Lymph nodes: Fullness noted bilateral axillary areas. Could not appreciate any inguinal adenopathy.  CBC    Component Value Date/Time   WBC 18.4* 11/07/2014 1516   RBC 5.03 11/07/2014 1516   HGB 14.0 11/07/2014 1516   HCT 43.3 11/07/2014 1516   MCV 86.0 11/07/2014 1516   MCH 27.9 11/07/2014 1516   MCHC 32.4 11/07/2014 1516      Chemistry      Component Value Date/Time   NA 138 11/07/2014 1453   K 4.1 11/07/2014 1453   CL 102 11/07/2014 1453   CO2 23 11/07/2014 1453   BUN 6 11/07/2014 1453   CREATININE 0.65 11/07/2014 1453      Component Value Date/Time   CALCIUM 9.4 11/07/2014 1453   CALCIUM 9.4 11/07/2014 1453   ALKPHOS 185* 11/07/2014 1453   AST 100* 11/07/2014 1453   ALT 134* 11/07/2014 1453   BILITOT 0.6 11/07/2014 1453      Ct Soft Tissue Neck W Contrast  11/15/2014   CLINICAL DATA:  24 year old female with palpable neck lymph nodes despite treatment with  antibiotics. Pain deep palpation. Concern for lymphoma. Initial encounter.  EXAM: CT NECK WITH CONTRAST  TECHNIQUE: Multidetector CT imaging of the neck was performed using the standard protocol following the bolus administration of intravenous contrast.  CONTRAST:  19m OMNIPAQUE IOHEXOL 300 MG/ML  SOLN  COMPARISON:  Neck ultrasound 11/08/2014.  FINDINGS: Pharynx and larynx: Negative; symmetric appearing adenoid and tonsillar soft tissues are favored to be physiologic in this age group. Parapharyngeal, retropharyngeal, and sublingual spaces are within normal limits.  Salivary glands: Negative.  Thyroid: Negative.  Lymph nodes: Abnormally increased in number at all cervical lymph node stations bilaterally. Furthermore many nodes are abnormally enlarged, abnormally rounded, or both. The largest individual nodes measure up to 19 mm short axis at the left level 2 a station. Right level 2 nodes measure 12 mm short axis individually. Increased bilateral parotid space nodes also are noted. The lymphadenopathy abates at the thoracic inlet, but there is evidence of increased bilateral axillary nodes albeit smaller than those in the neck.  Vascular: Normal.  Limited intracranial: Negative.  Mastoids and visualized paranasal sinuses: Minimal to mild mucosal thickening.  Skeleton: Negative.  Upper chest: Increased  bilateral axillary nodes, also described above. No superior mediastinal lymphadenopathy. Negative visualized lung parenchyma.  IMPRESSION: Diffuse bilateral cervical lymphadenopathy most compatible with acute lymphoma.  Recommend histologic correlation, and ultrasound guided percutaneous lymph node biopsy should be feasible.  These results will be called to the ordering clinician or representative by the Radiologist Assistant, and communication documented in the PACS or zVision Dashboard.   Electronically Signed   By: Lars Pinks M.D.   On: 11/15/2014 15:32      Assessment and Plan:   24 year old woman with the  following issues:  1. Lymphadenopathy noted in the cervical area and documented by CT scan of the neck on 11/15/2014. Her cervical lymphadenopathy is very suspicious for lymphoma. Fine-needle aspiration on 11/23/2014 failed to show any specific diagnosis. She does have constitutional symptoms of fevers, chills or sweats and pruritus. The differential diagnosis was discussed today with the patient and her aunt that accompanied her today. Lymphoma is the most likely diagnosis and he is to be rule out urgently. Given her age and presentation Hodgkin's disease is a very distinct possibility. Other possibilities would include viral infections such as EBV or CMV. HIV is less likely. Other granulomatous disease and reactive lymphadenopathy is also a consideration.  To work up these findings I will obtain a PET CT scan for staging purposes. I will also refer her to ENT for an excisional biopsy which is the appropriate modality to diagnose lymphoma. Percutaneous biopsy rarely able to confirm the diagnosis and also it will be difficult to determine what kind of lymphoma we are dealing with without adequate tissue sampling.  I had a lengthy discussion today with the patient and her aunt regarding the potential diagnosis of lymphoma and the potential effect of this diagnosis in her life. It is very possible it might affect her ability to work, attends school and do certain activities. It is highly encouraged to if she is indeed diagnosed with lymphoma to be close with family if it's possible. I explained to her that if she indeed has this diagnosis, she will likely require a bone marrow biopsy, systemic chemotherapy and possibly radiation therapy. She might require CNS prophylaxis with intrathecal chemotherapy or high-dose methotrexate.  She is considering a move to [place] if there is indeed we are dealing with malignant lymphoma that requires aggressive therapy.  2. Lymphocytosis: This is likely related to  the same process causing her lymphadenopathy. Whether to viral infection or more likely a lymphoproliferative process.  3. Work-related duties and missing time from class: Given her symptoms and her health condition, it is understandable she is not able to perform all activities required for her at work and she might have to miss classes to perform all the appropriate testing before making the final diagnosis.  Given her condition, I do not think she is able to perform heavy lifting or pressure heavy carts.  All the questions were answered today and we will arrange a follow-up after this staging PET scan as well as the biopsy has been completed.

## 2014-12-03 ENCOUNTER — Other Ambulatory Visit: Payer: Self-pay | Admitting: Emergency Medicine

## 2014-12-03 ENCOUNTER — Telehealth: Payer: Self-pay

## 2014-12-03 DIAGNOSIS — R599 Enlarged lymph nodes, unspecified: Secondary | ICD-10-CM

## 2014-12-03 MED ORDER — HYDROCODONE-ACETAMINOPHEN 5-325 MG PO TABS: 1.0000 | ORAL_TABLET | ORAL | Status: DC | PRN

## 2014-12-03 NOTE — Telephone Encounter (Signed)
Patient called requesting a refill on Norco 5-325 mg, Patient will like to go up on the dosage because her back is getting worse. [phone]

## 2014-12-03 NOTE — Telephone Encounter (Signed)
LM to advise pt ready for pick up Rx in pick up drawer.

## 2014-12-03 NOTE — Telephone Encounter (Signed)
We/re not treating her back.   Refill at desk

## 2014-12-04 ENCOUNTER — Telehealth: Payer: Self-pay | Admitting: Oncology

## 2014-12-04 NOTE — Telephone Encounter (Signed)
Faxed pt medical records to Dr. Wolicki [phone] °

## 2014-12-05 ENCOUNTER — Encounter: Payer: Self-pay | Admitting: Oncology

## 2014-12-05 NOTE — Progress Notes (Signed)
Faxed fmla form to [facility] @ [phone] °

## 2014-12-06 ENCOUNTER — Encounter (HOSPITAL_BASED_OUTPATIENT_CLINIC_OR_DEPARTMENT_OTHER): Payer: Self-pay | Admitting: *Deleted

## 2014-12-08 ENCOUNTER — Other Ambulatory Visit: Payer: Self-pay | Admitting: Otolaryngology

## 2014-12-08 NOTE — H&P (Signed)
Jacqueline [name],  Jacqueline [name] 23 y.o., female [id number]     Chief Complaint: Swollen lymph nodes  HPI: 23-year-old white female comes in for evaluation of neck adenopathy.  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   Roughly 6 weeks ago, she felt like there was fullness, possibly phlegm in her throat.  It did not seem to be coming from her nose.  Generally she could cough or clear her throat and not produce anything, but occasionally it did seem to be green.  She has been doing some coughing and throat clearing.  No wheezing in her chest, history of asthma, smoking, or dyspnea.  She does not think she has reflux.  She was seen at urgent care and told that she had allergies.  2 days later she developed palpable and visible adenopathy in her LEFT neck and over time has been having progressively more and larger lumps.  She has not complained of a sore throat throughout this course.  She was given a 10 day course of Augmentin which did not seem to help.  She had a neck ultrasound which showed multiple abnormal lymph nodes predominantly left-sided.  She had a CT scan which showed more of the same including LEFT level II jugular nodes 3 cm in greatest extent.  Finally, she had a ultrasound guided needle aspiration of a LEFT neck node which returned as benign/nondiagnostic.  Since that time she has seen Dr. Shadad in medical oncology  who is very concerned about possible lymphoma.  Over time, some of the lumps seem to be getting bigger, and some seem to be getting smaller.  A CBC showed a white blood cell count of 18,000 with 12,300 lymphocytes.  No atypical lymphocytes were reported.  To her knowledge, a test for mononucleosis was not performed.  She has developed some slightly tender tightness in axillae and groins, and maybe even a slight sense of fullness in her epi-trochlear  and popliteal   areas bilaterally.   On specific questioning, she is having night and day  sweats and some low-grade documented fevers.  She is tired and her appetite is poor.  She has new onset back pain.  She does not think she has reflux.  No prior similar issues with adenopathy.  A brother had non-Hodgkin's lymphoma in his teens which he has recovered from.  She does have polycystic ovary syndrome but right now it is not giving her much difficulty.   She has a PET scan pending for this coming weekend.   PMH: Past Medical History  Diagnosis Date  . PCOS (polycystic ovarian syndrome)   . Adenopathy     left neck    Surg Hx: Past Surgical History  Procedure Laterality Date  . Wisdom tooth extraction      FHx:   Family History  Problem Relation Age of Onset  . Diabetes Mother   . Hypertension Mother   . Cancer Maternal Grandmother   . Diabetes Paternal Grandmother   . Heart disease Paternal Grandmother   . Heart disease Paternal Grandfather    SocHx:  reports that she has never smoked. She has never used smokeless tobacco. She reports that she drinks alcohol. She reports that she does not use illicit drugs.  ALLERGIES: No Known Allergies   (Not in a hospital admission)  No results found for this or any previous visit (from the past 48 hour(s)). No results found.  ROS:Systemic: Feeling tired (fatigue), fever, and   night sweats.  No recent weight loss. Head: Headache. Eyes: No eye symptoms. Otolaryngeal: No hearing loss, no earache, no tinnitus, and no purulent nasal discharge.  Nasal passage blockage (stuffiness).  No snoring, no sneezing, no hoarseness, and no sore throat. Cardiovascular: No chest pain or discomfort  and no palpitations. Pulmonary: No dyspnea.  Cough.  No wheezing. Gastrointestinal: Dysphagia.  No heartburn.  Nausea  and abdominal pain.  No melena.  No diarrhea. Genitourinary: No dysuria. Endocrine: Muscle weakness. Musculoskeletal: No calf muscle cramps  and no arthralgias.  Soft tissue swelling. Neurological: No dizziness, no fainting, no  tingling, and no numbness. Psychological: No anxiety  and no depression. Skin: No rash.  Last menstrual period 11/19/2014.   BP:91/75,  HR: 60 b/min,  Height: 5 ft 4 in, Weight: 235 lb , BMI: 40.3 kg/m2,   PHYSICAL EXAM: She is mentally sharp and quite anxious.  She is somewhat heavyset.  She hears well in conversational speech.  Voice is clear and respirations unlabored through the nose.  The head is atraumatic and neck supple.  Cranial nerves intact.  Ear canals are clear with normal drums.  Anterior nose is moist and patent.  Oral cavity is clear with teeth in good repair.  Oropharynx shows a 2+ RIGHT, 1+ LEFT tonsil of otherwise normal configuration with normal soft palate.  Mirror examination of the hypopharynx/larynx does not show prominence of the lingual tonsils or significant swelling of the supraglottis.  Vocal cords are mobile.  I could not see their entire length.   No gross pooling in valleculae or piriforms.  Neck examination reveals slight fullness in the LEFT level II neck but I could not palpate discreet nodes anywhere.  I also did not feel any discreet nodes in her axillae on either side.  I palpated the epitrochlear and popliteal areas through her clothing  with no significant findings.  I did not examine her groins.   Lungs: Clear to auscultation Heart: Regular rate and rhythm without murmur Abdomen: Soft, active Extremities: Normal configuration Neurologic: Symmetric, grossly intact  Studies Reviewed: CT neck    Assessment/Plan Lymphadenopathy, cervical (785.6) (R59.0). Lymphadenopathy, generalized (785.6) (R59.1). Lymphadenopathy, axillary (785.6) (R59.0). Lymphadenopathy, inguinal (785.6) (R59.0).  Discussed  I am concerned about your lymph nodes.  Since they seem to be regressing in certain sites, I wonder if this could be something benign and inflammatory like Mononucleosis.  I will be very interested to see what the PET scan shows this weekend.  If all the  nodes are getting smaller, we may simply wait further.  I will talk with Dr. Shadad about the possibility of mononucleosis.  The PET scan might also direct us as to which lymph nodes might give the most accurate yield if we do a biopsy.  I will try to call you and discuss this after I see the PET results.  we are going to go ahead and plan for this surgery, knowning that we can cancel if indicated. Plan  In order not to require additional time to pass before arriving at a definitive diagnosis, we will schedule her for an open excisional biopsy of a neck node for early next week.  In the meantime, she will get a PET scan.  If the PET scan shows minimal activity, or if many of the nodes are now smaller than on her previous scans, we may simply choose to observe.  If there are nodes in other parts of the body that seem more likely to have a diagnostic   pathology, then we may ask someone else to do a biopsy.      I discussed the surgery in detail including risks and complications.  Questions were answered and informed consent was obtained.  I discussed return to activities including work and school.  She has hydrocodone at home that she is using for back pain.  I will see her back 2 weeks after surgery.  We'll call her with pathology reports when available  WOLICKI, KAROL 12/08/2014, 12:24 PM     

## 2014-12-09 ENCOUNTER — Encounter (HOSPITAL_COMMUNITY)
Admission: RE | Admit: 2014-12-09 | Discharge: 2014-12-09 | Disposition: A | Discharge status: Home / Self Care | Payer: BLUE CROSS/BLUE SHIELD | Source: Ambulatory Visit | Attending: Oncology | Admitting: Oncology

## 2014-12-09 ENCOUNTER — Encounter (HOSPITAL_COMMUNITY): Payer: Self-pay

## 2014-12-09 DIAGNOSIS — R591 Generalized enlarged lymph nodes: Secondary | ICD-10-CM | POA: Diagnosis present

## 2014-12-09 DIAGNOSIS — R599 Enlarged lymph nodes, unspecified: Secondary | ICD-10-CM

## 2014-12-09 LAB — GLUCOSE, CAPILLARY: Glucose-Capillary: 86 mg/dL (ref 70–99)

## 2014-12-09 MED ORDER — FLUDEOXYGLUCOSE F - 18 (FDG) INJECTION
13.4000 | Freq: Once | INTRAVENOUS | Status: AC | PRN
  Administered 2014-12-09: 13.4 via INTRAVENOUS

## 2014-12-11 ENCOUNTER — Ambulatory Visit (HOSPITAL_BASED_OUTPATIENT_CLINIC_OR_DEPARTMENT_OTHER)
Admission: RE | Admit: 2014-12-11 | Discharge: 2014-12-11 | Disposition: A | Discharge status: Home / Self Care | Payer: BLUE CROSS/BLUE SHIELD | Source: Ambulatory Visit | Attending: Otolaryngology | Admitting: Otolaryngology

## 2014-12-11 ENCOUNTER — Ambulatory Visit (HOSPITAL_BASED_OUTPATIENT_CLINIC_OR_DEPARTMENT_OTHER): Payer: BLUE CROSS/BLUE SHIELD | Admitting: Anesthesiology

## 2014-12-11 ENCOUNTER — Encounter (HOSPITAL_BASED_OUTPATIENT_CLINIC_OR_DEPARTMENT_OTHER): Payer: Self-pay

## 2014-12-11 ENCOUNTER — Encounter (HOSPITAL_BASED_OUTPATIENT_CLINIC_OR_DEPARTMENT_OTHER)
Admission: RE | Disposition: A | Discharge status: Home / Self Care | Payer: Self-pay | Source: Ambulatory Visit | Attending: Otolaryngology

## 2014-12-11 ENCOUNTER — Telehealth: Payer: Self-pay

## 2014-12-11 DIAGNOSIS — R590 Localized enlarged lymph nodes: Secondary | ICD-10-CM | POA: Insufficient documentation

## 2014-12-11 HISTORY — DX: Enlarged lymph nodes, unspecified: R59.9

## 2014-12-11 HISTORY — PX: LYMPH NODE BIOPSY: SHX201

## 2014-12-11 HISTORY — DX: Generalized enlarged lymph nodes: R59.1

## 2014-12-11 LAB — POCT HEMOGLOBIN-HEMACUE: Hemoglobin: 12.9 g/dL (ref 12.0–15.0)

## 2014-12-11 SURGERY — LYMPH NODE BIOPSY
Anesthesia: General | Site: Neck | Laterality: Left

## 2014-12-11 MED ORDER — EPHEDRINE SULFATE 50 MG/ML IJ SOLN
INTRAMUSCULAR | Status: DC | PRN
  Administered 2014-12-11: 10 mg via INTRAVENOUS

## 2014-12-11 MED ORDER — HYDROMORPHONE HCL 1 MG/ML IJ SOLN: 0.2500 mg | INTRAMUSCULAR | Status: DC | PRN

## 2014-12-11 MED ORDER — LIDOCAINE HCL (CARDIAC) 20 MG/ML IV SOLN
INTRAVENOUS | Status: DC | PRN
  Administered 2014-12-11: 50 mg via INTRAVENOUS

## 2014-12-11 MED ORDER — OXYCODONE HCL 5 MG PO TABS: 5.0000 mg | ORAL_TABLET | Freq: Once | ORAL | Status: DC | PRN

## 2014-12-11 MED ORDER — MIDAZOLAM HCL 5 MG/5ML IJ SOLN
INTRAMUSCULAR | Status: DC | PRN
  Administered 2014-12-11: 2 mg via INTRAVENOUS

## 2014-12-11 MED ORDER — LIDOCAINE-EPINEPHRINE 1 %-1:100000 IJ SOLN
INTRAMUSCULAR | Status: DC | PRN
  Administered 2014-12-11: 7 mL

## 2014-12-11 MED ORDER — OXYCODONE HCL 5 MG/5ML PO SOLN: 5.0000 mg | Freq: Once | ORAL | Status: DC | PRN

## 2014-12-11 MED ORDER — FENTANYL CITRATE 0.05 MG/ML IJ SOLN: 50.0000 ug | INTRAMUSCULAR | Status: DC | PRN

## 2014-12-11 MED ORDER — LACTATED RINGERS IV SOLN
INTRAVENOUS | Status: DC
  Administered 2014-12-11 (×2): via INTRAVENOUS

## 2014-12-11 MED ORDER — FENTANYL CITRATE 0.05 MG/ML IJ SOLN
INTRAMUSCULAR | Status: AC
  Filled 2014-12-11: qty 6

## 2014-12-11 MED ORDER — ONDANSETRON HCL 4 MG/2ML IJ SOLN
INTRAMUSCULAR | Status: DC | PRN
  Administered 2014-12-11: 4 mg via INTRAVENOUS

## 2014-12-11 MED ORDER — PROPOFOL 10 MG/ML IV BOLUS
INTRAVENOUS | Status: DC | PRN
  Administered 2014-12-11: 200 mg via INTRAVENOUS

## 2014-12-11 MED ORDER — MIDAZOLAM HCL 2 MG/2ML IJ SOLN: 1.0000 mg | INTRAMUSCULAR | Status: DC | PRN

## 2014-12-11 MED ORDER — DEXAMETHASONE SODIUM PHOSPHATE 4 MG/ML IJ SOLN
INTRAMUSCULAR | Status: DC | PRN
  Administered 2014-12-11: 10 mg via INTRAVENOUS

## 2014-12-11 MED ORDER — ONDANSETRON HCL 4 MG/2ML IJ SOLN: 4.0000 mg | Freq: Once | INTRAMUSCULAR | Status: DC | PRN

## 2014-12-11 MED ORDER — LIDOCAINE-EPINEPHRINE 1 %-1:100000 IJ SOLN
INTRAMUSCULAR | Status: AC
  Filled 2014-12-11: qty 1

## 2014-12-11 MED ORDER — FENTANYL CITRATE 0.05 MG/ML IJ SOLN
INTRAMUSCULAR | Status: DC | PRN
  Administered 2014-12-11: 100 ug via INTRAVENOUS
  Administered 2014-12-11 (×2): 50 ug via INTRAVENOUS

## 2014-12-11 MED ORDER — MIDAZOLAM HCL 2 MG/2ML IJ SOLN
INTRAMUSCULAR | Status: AC
  Filled 2014-12-11: qty 2

## 2014-12-11 SURGICAL SUPPLY — 57 items
ATTRACTOMAT 16X20 MAGNETIC DRP (DRAPES) ×2 IMPLANT
BANDAGE ELASTIC 3 VELCRO ST LF (GAUZE/BANDAGES/DRESSINGS) IMPLANT
BENZOIN TINCTURE PRP APPL 2/3 (GAUZE/BANDAGES/DRESSINGS) ×2 IMPLANT
BLADE SURG 15 STRL LF DISP TIS (BLADE) ×1 IMPLANT
BLADE SURG 15 STRL SS (BLADE) ×1
BNDG GAUZE ELAST 4 BULKY (GAUZE/BANDAGES/DRESSINGS) IMPLANT
CANISTER SUCT 1200ML W/VALVE (MISCELLANEOUS) ×2 IMPLANT
CORDS BIPOLAR (ELECTRODE) ×2 IMPLANT
COVER BACK TABLE 60X90IN (DRAPES) ×2 IMPLANT
COVER MAYO STAND STRL (DRAPES) ×2 IMPLANT
DECANTER SPIKE VIAL GLASS SM (MISCELLANEOUS) IMPLANT
DRAIN JACKSON RD 7FR 3/32 (WOUND CARE) IMPLANT
DRAIN PENROSE 1/4X12 LTX STRL (WOUND CARE) IMPLANT
DRAPE SURG 17X23 STRL (DRAPES) IMPLANT
DRAPE U-SHAPE 76X120 STRL (DRAPES) ×2 IMPLANT
ELECT COATED BLADE 2.86 ST (ELECTRODE) ×2 IMPLANT
ELECT PAIRED SUBDERMAL (MISCELLANEOUS)
ELECT REM PT RETURN 9FT ADLT (ELECTROSURGICAL) ×2
ELECTRODE PAIRED SUBDERMAL (MISCELLANEOUS) IMPLANT
ELECTRODE REM PT RTRN 9FT ADLT (ELECTROSURGICAL) ×1 IMPLANT
EVACUATOR SILICONE 100CC (DRAIN) IMPLANT
FORCEPS TISS BAYO ENTCEPS (INSTRUMENTS) ×2 IMPLANT
GAUZE SPONGE 4X4 12PLY STRL (GAUZE/BANDAGES/DRESSINGS) ×2 IMPLANT
GLOVE BIO SURGEON STRL SZ 6.5 (GLOVE) ×2 IMPLANT
GLOVE BIOGEL PI IND STRL 7.5 (GLOVE) ×1 IMPLANT
GLOVE BIOGEL PI INDICATOR 7.5 (GLOVE) ×1
GLOVE ECLIPSE 8.0 STRL XLNG CF (GLOVE) ×2 IMPLANT
GOWN STRL REUS W/ TWL LRG LVL3 (GOWN DISPOSABLE) ×1 IMPLANT
GOWN STRL REUS W/ TWL XL LVL3 (GOWN DISPOSABLE) ×1 IMPLANT
GOWN STRL REUS W/TWL LRG LVL3 (GOWN DISPOSABLE) ×1
GOWN STRL REUS W/TWL XL LVL3 (GOWN DISPOSABLE) ×1
LIQUID BAND (GAUZE/BANDAGES/DRESSINGS) IMPLANT
LOCATOR NERVE 3 VOLT (DISPOSABLE) IMPLANT
NEEDLE HYPO 25X1 1.5 SAFETY (NEEDLE) ×2 IMPLANT
NS IRRIG 1000ML POUR BTL (IV SOLUTION) ×2 IMPLANT
PACK BASIN DAY SURGERY FS (CUSTOM PROCEDURE TRAY) ×2 IMPLANT
PENCIL BUTTON HOLSTER BLD 10FT (ELECTRODE) ×2 IMPLANT
PROBE NERVBE PRASS .33 (MISCELLANEOUS) IMPLANT
SHEET MEDIUM DRAPE 40X70 STRL (DRAPES) IMPLANT
SPONGE GAUZE 4X4 12PLY STER LF (GAUZE/BANDAGES/DRESSINGS) IMPLANT
SPONGE INTESTINAL PEANUT (DISPOSABLE) ×2 IMPLANT
STAPLER VISISTAT (STAPLE) IMPLANT
STRIP CLOSURE SKIN 1/2X4 (GAUZE/BANDAGES/DRESSINGS) ×2 IMPLANT
SUT CHROMIC 3 0 SH 27 (SUTURE) IMPLANT
SUT CHROMIC 4 0 P 3 18 (SUTURE) ×2 IMPLANT
SUT ETHILON 5 0 P 3 18 (SUTURE)
SUT ETHILON 5 0 PS 2 18 (SUTURE) ×2 IMPLANT
SUT NOVAFIL 5 0 BLK 18 IN P13 (SUTURE) IMPLANT
SUT NYLON ETHILON 5-0 P-3 1X18 (SUTURE) IMPLANT
SUT VIC AB 5-0 P-3 18X BRD (SUTURE) IMPLANT
SUT VIC AB 5-0 P3 18 (SUTURE)
SYR BULB 3OZ (MISCELLANEOUS) ×2 IMPLANT
SYR CONTROL 10ML LL (SYRINGE) ×2 IMPLANT
TOWEL OR 17X24 6PK STRL BLUE (TOWEL DISPOSABLE) ×4 IMPLANT
TRAY DSU PREP LF (CUSTOM PROCEDURE TRAY) ×2 IMPLANT
TUBE CONNECTING 20X1/4 (TUBING) ×2 IMPLANT
YANKAUER SUCT BULB TIP NO VENT (SUCTIONS) ×2 IMPLANT

## 2014-12-11 NOTE — Discharge Instructions (Signed)
Ice pack x 24 hrs, then as desired Keep head elevated 3-4 days May resume ordinary activities after 24 hrs No strenuous activities x 10 days OK to remove outer dressing tomorrow OK to shower beginning tomorrow Diet as comfortable Call for signs of bleeding or infection I will call you when the pathology report is available, likely Thursday or Friday Recheck my office 7-8 days for suture removal, [phone] for an appointment    Post Anesthesia Home Care Instructions  Activity: Get plenty of rest for the remainder of the day. A responsible adult should stay with you for 24 hours following the procedure.  For the next 24 hours, DO NOT: -Drive a car -Advertising copywriterperate machinery -Drink alcoholic beverages -Take any medication unless instructed by your physician -Make any legal decisions or sign important papers.  Meals: Start with liquid foods such as gelatin or soup. Progress to regular foods as tolerated. Avoid greasy, spicy, heavy foods. If nausea and/or vomiting occur, drink only clear liquids until the nausea and/or vomiting subsides. Call your physician if vomiting continues.  Special Instructions/Symptoms: Your throat may feel dry or sore from the anesthesia or the breathing tube placed in your throat during surgery. If this causes discomfort, gargle with warm salt water. The discomfort should disappear within 24 hours.

## 2014-12-11 NOTE — Telephone Encounter (Signed)
Pt called requesting results from PET.  Let pt know I could not release results, but the I would let Dr. Stormy FabianShada's desk nurse know and inquire if the results could be released to MyChart.  Routed to Dr. Clelia CroftShadad and desk nurse for follow up and return call to patient.

## 2014-12-11 NOTE — Anesthesia Postprocedure Evaluation (Signed)
  Anesthesia Post-op Note  Patient: Jacqueline [name]  Procedure(s) Performed: Procedure(s): LYMPH NODE BIOPSY (Left)  Patient Location: PACU  Anesthesia Type: General   Level of Consciousness: awake, alert  and oriented  Airway and Oxygen Therapy: Patient Spontanous Breathing  Post-op Pain: none  Post-op Assessment: Post-op Vital signs reviewed  Post-op Vital Signs: Reviewed  Last Vitals:  Filed Vitals:   12/11/14 1247  BP: 130/70  Pulse: 87  Temp: 37.1 C  Resp: 16    Complications: No apparent anesthesia complications

## 2014-12-11 NOTE — H&P (View-Only) (Signed)
Jacqueline [name],  Jacqueline [name] 24 y.o., female [id number]     Chief Complaint: Swollen lymph nodes  HPI: 24 year old white female comes in for evaluation of neck adenopathy.  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   Roughly 6 weeks ago, she felt like there was fullness, possibly phlegm in her throat.  It did not seem to be coming from her nose.  Generally she could cough or clear her throat and not produce anything, but occasionally it did seem to be green.  She has been doing some coughing and throat clearing.  No wheezing in her chest, history of asthma, smoking, or dyspnea.  She does not think she has reflux.  She was seen at urgent care and told that she had allergies.  2 days later she developed palpable and visible adenopathy in her LEFT neck and over time has been having progressively more and larger lumps.  She has not complained of a sore throat throughout this course.  She was given a 10 day course of Augmentin which did not seem to help.  She had a neck ultrasound which showed multiple abnormal lymph nodes predominantly left-sided.  She had a CT scan which showed more of the same including LEFT level II jugular nodes 3 cm in greatest extent.  Finally, she had a ultrasound guided needle aspiration of a LEFT neck node which returned as benign/nondiagnostic.  Since that time she has seen Dr. Clelia CroftShadad in medical oncology  who is very concerned about possible lymphoma.  Over time, some of the lumps seem to be getting bigger, and some seem to be getting smaller.  A CBC showed a white blood cell count of 18,000 with 12,300 lymphocytes.  No atypical lymphocytes were reported.  To her knowledge, a test for mononucleosis was not performed.  She has developed some slightly tender tightness in axillae and groins, and maybe even a slight sense of fullness in her epi-trochlear  and popliteal   areas bilaterally.   On specific questioning, she is having night and day  sweats and some low-grade documented fevers.  She is tired and her appetite is poor.  She has new onset back pain.  She does not think she has reflux.  No prior similar issues with adenopathy.  A brother had non-Hodgkin's lymphoma in his teens which he has recovered from.  She does have polycystic ovary syndrome but right now it is not giving her much difficulty.   She has a PET scan pending for this coming weekend.   PMH: Past Medical History  Diagnosis Date  . PCOS (polycystic ovarian syndrome)   . Adenopathy     left neck    Surg Hx: Past Surgical History  Procedure Laterality Date  . Wisdom tooth extraction      FHx:   Family History  Problem Relation Age of Onset  . Diabetes Mother   . Hypertension Mother   . Cancer Maternal Grandmother   . Diabetes Paternal Grandmother   . Heart disease Paternal Grandmother   . Heart disease Paternal Grandfather    SocHx:  reports that she has never smoked. She has never used smokeless tobacco. She reports that she drinks alcohol. She reports that she does not use illicit drugs.  ALLERGIES: No Known Allergies   (Not in a hospital admission)  No results found for this or any previous visit (from the past 48 hour(s)). No results found.  WUJ:WJXBJYNWROS:Systemic: Feeling tired (fatigue), fever, and  night sweats.  No recent weight loss. Head: Headache. Eyes: No eye symptoms. Otolaryngeal: No hearing loss, no earache, no tinnitus, and no purulent nasal discharge.  Nasal passage blockage (stuffiness).  No snoring, no sneezing, no hoarseness, and no sore throat. Cardiovascular: No chest pain or discomfort  and no palpitations. Pulmonary: No dyspnea.  Cough.  No wheezing. Gastrointestinal: Dysphagia.  No heartburn.  Nausea  and abdominal pain.  No melena.  No diarrhea. Genitourinary: No dysuria. Endocrine: Muscle weakness. Musculoskeletal: No calf muscle cramps  and no arthralgias.  Soft tissue swelling. Neurological: No dizziness, no fainting, no  tingling, and no numbness. Psychological: No anxiety  and no depression. Skin: No rash.  Last menstrual period 11/19/2014.   BP:91/75,  HR: 60 b/min,  Height: 5 ft 4 in, Weight: 235 lb , BMI: 40.3 kg/m2,   PHYSICAL EXAM: She is mentally sharp and quite anxious.  She is somewhat heavyset.  She hears well in conversational speech.  Voice is clear and respirations unlabored through the nose.  The head is atraumatic and neck supple.  Cranial nerves intact.  Ear canals are clear with normal drums.  Anterior nose is moist and patent.  Oral cavity is clear with teeth in good repair.  Oropharynx shows a 2+ RIGHT, 1+ LEFT tonsil of otherwise normal configuration with normal soft palate.  Mirror examination of the hypopharynx/larynx does not show prominence of the lingual tonsils or significant swelling of the supraglottis.  Vocal cords are mobile.  I could not see their entire length.   No gross pooling in valleculae or piriforms.  Neck examination reveals slight fullness in the LEFT level II neck but I could not palpate discreet nodes anywhere.  I also did not feel any discreet nodes in her axillae on either side.  I palpated the epitrochlear and popliteal areas through her clothing  with no significant findings.  I did not examine her groins.   Lungs: Clear to auscultation Heart: Regular rate and rhythm without murmur Abdomen: Soft, active Extremities: Normal configuration Neurologic: Symmetric, grossly intact  Studies Reviewed: CT neck    Assessment/Plan Lymphadenopathy, cervical (785.6) (R59.0). Lymphadenopathy, generalized (785.6) (R59.1). Lymphadenopathy, axillary (785.6) (R59.0). Lymphadenopathy, inguinal (785.6) (R59.0).  Discussed  I am concerned about your lymph nodes.  Since they seem to be regressing in certain sites, I wonder if this could be something benign and inflammatory like Mononucleosis.  I will be very interested to see what the PET scan shows this weekend.  If all the  nodes are getting smaller, we may simply wait further.  I will talk with Dr. Clelia Croft about the possibility of mononucleosis.  The PET scan might also direct [place] as to which lymph nodes might give the most accurate yield if we do a biopsy.  I will try to call you and discuss this after I see the PET results.  we are going to go ahead and plan for this surgery, knowning that we can cancel if indicated. Plan  In order not to require additional time to pass before arriving at a definitive diagnosis, we will schedule her for an open excisional biopsy of a neck node for early next week.  In the meantime, she will get a PET scan.  If the PET scan shows minimal activity, or if many of the nodes are now smaller than on her previous scans, we may simply choose to observe.  If there are nodes in other parts of the body that seem more likely to have a diagnostic  pathology, then we may ask someone else to do a biopsy.      I discussed the surgery in detail including risks and complications.  Questions were answered and informed consent was obtained.  I discussed return to activities including work and school.  She has hydrocodone at home that she is using for back pain.  I will see her back 2 weeks after surgery.  We'll call her with pathology reports when available  Flo Shanks 12/08/2014, 12:24 PM

## 2014-12-11 NOTE — Anesthesia Preprocedure Evaluation (Signed)
Anesthesia Evaluation  Patient identified by MRN, date of birth, ID band Patient awake    Reviewed: Allergy & Precautions, NPO status , Patient's Chart, lab work & pertinent test results  Airway Mallampati: I  TM Distance: >3 FB Neck ROM: Full    Dental  (+) Teeth Intact, Dental Advisory Given   Pulmonary  breath sounds clear to auscultation        Cardiovascular Rhythm:Regular Rate:Normal     Neuro/Psych    GI/Hepatic   Endo/Other  Morbid obesity  Renal/GU      Musculoskeletal   Abdominal   Peds  Hematology   Anesthesia Other Findings   Reproductive/Obstetrics                             Anesthesia Physical Anesthesia Plan  ASA: II  Anesthesia Plan: General   Post-op Pain Management:    Induction: Intravenous  Airway Management Planned: LMA  Additional Equipment:   Intra-op Plan:   Post-operative Plan: Extubation in OR  Informed Consent: I have reviewed the patients History and Physical, chart, labs and discussed the procedure including the risks, benefits and alternatives for the proposed anesthesia with the patient or authorized representative who has indicated his/her understanding and acceptance.   Dental advisory given  Plan Discussed with: CRNA, Anesthesiologist and Surgeon  Anesthesia Plan Comments:         Anesthesia Quick Evaluation  

## 2014-12-11 NOTE — Op Note (Signed)
12/11/2014  11:28 AM    [name], Jacqueline ReiningNicole  [id number]   Pre-Op Dx:  Cervical lymphadenopathy, rule out lymphoma  Post-op Dx: Same  Proc: Excisional biopsy, left level II neck nodes   Surg:  Cephus RicherWOLICKI, KAROL T MD  Anes:  GLMA  EBL:  Minimal  Comp:  None  Findings:  Matted jugular lymph nodes which were soft. The removed specimen was approximately 1.5 x 3.0 x 1.0 cm.  Procedure: With the patient in a comfortable supine position, general mask anesthesia was administered. At an appropriate level, LMA intubation was accomplished and anesthesia was continued.  The patient was placed in reverse Trendelenburg and the head rotated to the right for access to the left neck. Orienting marks were identified. Palpation revealed the sternomastoid muscle, mastoid tip, angle of mandible. The proposed incision was infiltrated with 1% Xylocaine with 1:100,000 epinephrine, 8 mL's total.  A sterile preparation and draping of the left neck was accomplished in the standard fashion.  Once again the neck was palpated. The lymph nodes were not palpable. A 4 cm incision was made along the anterior edge of the palpable sternomastoid muscle in a pre-existing skin wrinkle. This was carried down through skin and abundant subcuticular fat. The platysma muscle was identified and lysed. A brief superior subplatysmal flap was generated. There was significant subplatysmal fat also.  Working around the free edge of the sternomastoid muscle towards the jugular vein, lymph node bearing area was identified and soft tissues were dissected. Palpable nodes were finally identified. Tissues adjacent to the node were elevated with a hemostat and then divided using the ENTcepts thermal forceps.  The lymph node was gradually dissected and controlled using the forceps actually several lymph nodes in a matt were removed together and sent off for lymphoma interpretation.  There was no suggestion of activation of the ramus mandibularis  nerve or the spinal accessory nerve during the procedure.  The wound was thoroughly irrigated. Valsalva revealed no bleeding sites. It was closed with interrupted 4-0 chromic at the platysma layer, and a running subcuticular 5-0 Ethilon on the skin. Benzoin and Steri-Strips were used to complete the closure. A fluff and Hypafix dressing was applied. At this point the procedure was completed.  The patient was returned to anesthesia, awakened, extubated, and transferred to recovery in stable condition.   Dispo:   PACU to home  Plan:  Ice, elevation, analgesia, limited activity. Routine diet. Will plan further upon receiving the pathology reports. I will remove the sutures in 7-8 days in my office.  Cephus RicherWOLICKI,  KAROL T MD

## 2014-12-11 NOTE — Interval H&P Note (Signed)
History and Physical Interval Note:  12/11/2014 10:16 AM  Jacqueline ConnorsNicole [name]  has presented today for surgery, with the diagnosis of LEFT NECK ADENOPATHY  The various methods of treatment have been discussed with the patient and family. After consideration of risks, benefits and other options for treatment, the patient has consented to  Procedure(s): LYMPH NODE BIOPSY (Left) as a surgical intervention .  The patient's history has been re-reviewed, patient re-examined, no change in status, stable for surgery.  I have re-reviewed the patient's chart and labs.  Questions were answered to the patient's satisfaction.     Flo ShanksWOLICKI, KAROL

## 2014-12-11 NOTE — Anesthesia Procedure Notes (Signed)
Procedure Name: LMA Insertion Date/Time: 12/11/2014 10:32 AM Performed by: Zenia ResidesPAYNE, LINDA D Pre-anesthesia Checklist: Patient identified, Emergency Drugs available, Suction available and Patient being monitored Patient Re-evaluated:Patient Re-evaluated prior to inductionOxygen Delivery Method: Circle System Utilized Preoxygenation: Pre-oxygenation with 100% oxygen Intubation Type: IV induction Ventilation: Mask ventilation without difficulty LMA: LMA inserted LMA Size: 4.0 Number of attempts: 1 Airway Equipment and Method: Bite block Placement Confirmation: positive ETCO2 Tube secured with: Tape Dental Injury: Teeth and Oropharynx as per pre-operative assessment

## 2014-12-11 NOTE — Transfer of Care (Signed)
Immediate Anesthesia Transfer of Care Note  Patient: Jacqueline [name]  Procedure(s) Performed: Procedure(s): LYMPH NODE BIOPSY (Left)  Patient Location: PACU  Anesthesia Type:General  Level of Consciousness: awake and alert   Airway & Oxygen Therapy: Patient Spontanous Breathing and Patient connected to face mask oxygen  Post-op Assessment: Report given to RN and Post -op Vital signs reviewed and stable  Post vital signs: Reviewed and stable  Last Vitals:  Filed Vitals:   12/11/14 0911  BP: 113/64  Pulse: 65  Temp: 36.7 C  Resp: 18    Complications: No apparent anesthesia complications

## 2014-12-12 ENCOUNTER — Ambulatory Visit (HOSPITAL_COMMUNITY): Payer: BLUE CROSS/BLUE SHIELD

## 2014-12-13 ENCOUNTER — Ambulatory Visit (HOSPITAL_BASED_OUTPATIENT_CLINIC_OR_DEPARTMENT_OTHER): Payer: BLUE CROSS/BLUE SHIELD | Admitting: Oncology

## 2014-12-13 ENCOUNTER — Encounter (HOSPITAL_BASED_OUTPATIENT_CLINIC_OR_DEPARTMENT_OTHER): Payer: Self-pay | Admitting: Otolaryngology

## 2014-12-13 ENCOUNTER — Telehealth: Payer: Self-pay | Admitting: Oncology

## 2014-12-13 VITALS — BP 125/66 | HR 89 | Temp 97.4°F | Resp 19 | Ht 63.0 in | Wt 234.4 lb

## 2014-12-13 DIAGNOSIS — R591 Generalized enlarged lymph nodes: Secondary | ICD-10-CM

## 2014-12-13 DIAGNOSIS — R509 Fever, unspecified: Secondary | ICD-10-CM

## 2014-12-13 DIAGNOSIS — M2550 Pain in unspecified joint: Secondary | ICD-10-CM

## 2014-12-13 NOTE — Progress Notes (Signed)
Hematology and Oncology Follow Up Visit  Jacqueline [name] [id number] 1991/05/06 24 y.o. 12/13/2014 4:24 PM No PCP Per PatientNo ref. provider found   Principle Diagnosis: 24 year old woman with lymphadenopathy and fever biopsy have failed to show a lymphoproliferative disorder. This was diagnosed in November 2015.   Prior Therapy: She is status post ultrasound-guided biopsy on 11/23/2014 and had a repeat excisional biopsy on 12/11/2014. Pathology failed to reveal any lymphoproliferative disorder.  Current therapy: Supportive care only.  Interim History: Jacqueline [name] presents today for a follow-up visit. Since the last visit, she underwent a PET scan any biopsy that was done by Dr. Lazarus SalinesWolicki on 12/11/2014. She tolerated well and recovering at this time. She has reported some pain and soreness around the incision site but otherwise doing well. She continues to have diffuse arthralgias and body aches. She also had fevers and night sweats. Appetite is improving and able to tolerate liquids like soup.  She is not report any headaches, blurry vision, syncope or seizures. She does not report any chest pain, palpitation, orthopnea, leg edema. She does not report any shortness of breath, wheezing or hemoptysis. She does report nonproductive cough. She does not report any nausea, vomiting, abdominal pain, early satiety, constipation, diarrhea, hematochezia, melena or any changes in bowel habits. She does not report any neurological deficits such as neuropathy or upper extremity weakness. She does not report any petechiae or easy bruisability. She does report occasional anxiety but no depression. Rest of her review of systems unremarkable.   Medications:    Current Outpatient Prescriptions  Medication Sig Dispense Refill  . HYDROcodone-acetaminophen (NORCO/VICODIN) 5-325 MG per tablet Take 1-2 tablets by mouth every 4 (four) hours as needed for moderate pain. Filled by Dr. Thornton PapasJeffrey Anderson 30 tablet 0  .  levonorgestrel-ethinyl estradiol (AVIANE,ALESSE,LESSINA) 0.1-20 MG-MCG tablet Take 1 tablet by mouth daily.    . Naproxen (NAPROSYN PO) Take 1 tablet by mouth 2 (two) times daily as needed. Uses OTC Naprosyn.     No current facility-administered medications for this visit.     Allergies: No Known Allergies  Past Medical History, Surgical history, Social history, and Family History were reviewed and updated.  Physical Exam: Blood pressure 125/66, pulse 89, temperature 97.4 F (36.3 C), temperature source Oral, resp. rate 19, height 5\' 3"  (1.6 m), weight 234 lb 6.4 oz (106.323 kg), last menstrual period 11/19/2014, SpO2 100 %. ECOG: 0 General appearance: alert and cooperative Head: Normocephalic, without obvious abnormality Neck: no adenopathy Lymph nodes: Cervical, supraclavicular, and axillary nodes normal. Heart:regular rate and rhythm, S1, S2 normal, no murmur, click, rub or gallop Lung:chest clear, no wheezing, rales, normal symmetric air entry Abdomin: soft, non-tender, without masses or organomegaly EXT:no erythema, induration, or nodules   Lab Results: Lab Results  Component Value Date   WBC 18.4* 11/07/2014   HGB 12.9 12/11/2014   HCT 43.3 11/07/2014   MCV 86.0 11/07/2014     Chemistry      Component Value Date/Time   NA 138 11/07/2014 1453   K 4.1 11/07/2014 1453   CL 102 11/07/2014 1453   CO2 23 11/07/2014 1453   BUN 6 11/07/2014 1453   CREATININE 0.65 11/07/2014 1453      Component Value Date/Time   CALCIUM 9.4 11/07/2014 1453   CALCIUM 9.4 11/07/2014 1453   ALKPHOS 185* 11/07/2014 1453   AST 100* 11/07/2014 1453   ALT 134* 11/07/2014 1453   BILITOT 0.6 11/07/2014 1453       Radiological Studies:  EXAM: NUCLEAR  MEDICINE PET SKULL BASE TO THIGH  TECHNIQUE: 13.4 mCi F-18 FDG was injected intravenously. Full-ring PET imaging was performed from the skull base to thigh after the radiotracer. CT data was obtained and used for attenuation correction  and anatomic localization.  FASTING BLOOD GLUCOSE: Value: 86 mg/dl  COMPARISON: CT neck dated 11/15/2014  FINDINGS: NECK  Symmetric hypermetabolism in the bilateral tonsils, max SUV 8.5, without underlying mass on recent CT neck.  Bilateral hypermetabolic cervical nodes, including:  --13 mm short axis left level II node (series 4/image 26), max SUV 7.2, previously 19 mm  --10 mm short axis right level II node (series 4/ image 26), max SUV 6.7, previously 12 mm  --7 mm short axis left supraclavicular node (series 4/image 38), max SUV 3.4, previously 9 mm  CHEST  Bilateral hypermetabolic axillary nodes, including:  --10 mm short axis right axillary node (series 4/ image 57), max SUV 5.9  --13 mm short axis left axillary node (series 4/image 59), max SUV 9.0  --8 mm short axis left subpectoral node (series 4/image 63), max SUV 2.8  No hypermetabolic mediastinal or hilar nodes.  No suspicious pulmonary nodules on the CT scan.  ABDOMEN/PELVIS  No abnormal hypermetabolic activity within the liver, pancreas, adrenal glands, or spleen.  Bilateral hypermetabolic iliac chain and inguinal nodes, including:  --9 mm short axis left deep inguinal node (series 4/ image 135), max SUV 4.8  --6 mm short axis right external iliac node (series 4/ image 169), max SUV 5.1  --5 mm short axis left external iliac node (series 4/ image 171), max SUV 3.9  --6 mm short axis right deep inguinal node (series 4/image 173), max SUV 4.8  --5 mm short axis left inguinal node (series 4/image 177), max SUV 3.0  --6 mm short axis right inguinal node (series 4/ image 178), max SUV 3.2  SKELETON  No focal hypermetabolic activity to suggest skeletal metastasis.  IMPRESSION: Bilateral hypermetabolic cervical, axillary, and iliac/inguinal nodes.  While this is still considered suspicious for lymphoma until proven otherwise, the interval slight decrease  in size of the cervical lymphadenopathy at least raises the possibility of a reactive lymphadenopathy from an underlying systemic/infectious/inflammatory disorder.  Symmetric hypermetabolism of the bilateral tonsils, likely reactive.  These results were called by telephone at the time of interpretation on 12/11/2014 at 9:00 am to Dr. Lazarus Salines, who verbally acknowledged these results.  Impression and Plan:   24 year old woman with the following issues:  1. Lymphadenopathy involving bilateral cervical axillary and inguinal nodes. PET scan results discussed with the patient and her family today. Although the lymphadenopathy is FDG avid and has decreased in size indicating less likely malignant etiology. 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 Given these findings, he would be extremely unlikely for a lymphoproliferative process or lymphoma to cause this lymphadenopathy with 2 biopsies failed to confirm the diagnosis. I believe the excisional biopsy obtained on 12/11/2014 had excellent material and sufficient to rule out lymphoma.  The etiology of her lymphadenopathy is unclear. She does have fever of unknown origin associated with body aches which could possibly indicate a viral infection. I feel the best way to proceed of any further workup evaluation would be an infectious disease referral to rule out any infectious process that could be causing her lymphadenopathy and symptoms.  2. Diffuse pain and body aches: Her PET scan did not show any evidence of advanced malignancy and her pain is unrelated to that.  It could be related to the main cause of her lymphadenopathy and constitutional symptoms.  3. Follow-up: Like to check on her clinical status in 6 weeks and reevaluate her at that time hoping that she is recovering and improving lymphadenopathy at that time.  All her questions and family questions were answered  to their satisfaction.  [facility], MD 2/3/20164:24 PM

## 2014-12-13 NOTE — Telephone Encounter (Signed)
gv and printed appt sched and avs for pt for March.....[facility]queline [name] at [facility] and they will work workque and sched appt with pt

## 2014-12-15 NOTE — Telephone Encounter (Signed)
Dr Clelia Croftshadad discussed results of PET scan at visit on 12/13/14

## 2014-12-19 ENCOUNTER — Telehealth: Payer: Self-pay | Admitting: *Deleted

## 2014-12-19 NOTE — Telephone Encounter (Signed)
Pt left a message stating she had not been scheduled with infectious disease and they had not heard from us.  RN noted that referral with done on 2/3 and scheduler spoke with Vikki PortsValerie at [facility]. RN asked our scheduler to F/U.  Let message for patient that it was sent and we will follow up

## 2015-01-03 ENCOUNTER — Telehealth: Payer: Self-pay | Admitting: *Deleted

## 2015-01-03 ENCOUNTER — Encounter: Payer: Self-pay | Admitting: Infectious Diseases

## 2015-01-03 ENCOUNTER — Ambulatory Visit (INDEPENDENT_AMBULATORY_CARE_PROVIDER_SITE_OTHER): Payer: BLUE CROSS/BLUE SHIELD | Admitting: Infectious Diseases

## 2015-01-03 VITALS — BP 110/63 | HR 92 | Temp 97.5°F | Ht 64.0 in | Wt 233.0 lb

## 2015-01-03 DIAGNOSIS — R509 Fever, unspecified: Secondary | ICD-10-CM

## 2015-01-03 LAB — CBC WITH DIFFERENTIAL/PLATELET
Basophils Absolute: 0 10*3/uL (ref 0.0–0.1)
Basophils Relative: 0 % (ref 0–1)
Eosinophils Absolute: 0.1 10*3/uL (ref 0.0–0.7)
Eosinophils Relative: 2 % (ref 0–5)
HCT: 36.7 % (ref 36.0–46.0)
Hemoglobin: 12.3 g/dL (ref 12.0–15.0)
Lymphocytes Relative: 61 % — ABNORMAL HIGH (ref 12–46)
Lymphs Abs: 3.4 10*3/uL (ref 0.7–4.0)
MCH: 27.6 pg (ref 26.0–34.0)
MCHC: 33.5 g/dL (ref 30.0–36.0)
MCV: 82.3 fL (ref 78.0–100.0)
MPV: 9 fL (ref 8.6–12.4)
Monocytes Absolute: 0.4 10*3/uL (ref 0.1–1.0)
Monocytes Relative: 7 % (ref 3–12)
Neutro Abs: 1.7 10*3/uL (ref 1.7–7.7)
Neutrophils Relative %: 30 % — ABNORMAL LOW (ref 43–77)
Platelets: 229 10*3/uL (ref 150–400)
RBC: 4.46 MIL/uL (ref 3.87–5.11)
RDW: 13.8 % (ref 11.5–15.5)
WBC: 5.6 10*3/uL (ref 4.0–10.5)

## 2015-01-03 NOTE — Addendum Note (Signed)
Addended by: Mariea ClontsGREEN, KAREN D on: 01/03/2015 04:14 PM   Modules accepted: Orders

## 2015-01-03 NOTE — Assessment & Plan Note (Signed)
The etiology of her syndrome is unclear.  Will test her for SLE, RF, ANCA.  Will test her for EBV, CMV, toxo.  Will test her for HIV, RPR.  Will check MRI of her L-S spine. Could consider Reiter's? Will see her back in 2 weeks.

## 2015-01-03 NOTE — Telephone Encounter (Signed)
Pt called requested refill of Norco 5-325 .  Pt stated she has appt with her PCP on 01/18/15.  Pt wanted to know if Dr. Clelia CroftShadad would consider giving pt enough pain meds until pt sees her new PCP.  Message sent to Dr. Clelia CroftShadad and Yehuda Maoixie, desk nurse today. Pt's  Phone    [phone].

## 2015-01-03 NOTE — Progress Notes (Signed)
   Subjective:    Patient ID: Jacqueline [name], female    DOB: January 14, 1991, 24 y.o.   MRN: [id number]  HPI 24 yo F with hx of FUO and LAN for 2 months. She has had back pain as well. She had CT scan 1-6 showing B LAN felt to be consistent with lymphoma. She ten underwent PET scan:  Bilateral hypermetabolic cervical, axillary, and iliac/inguinal Nodes. While this is still considered suspicious for lymphoma until proven otherwise, the interval slight decrease in size of the cervical lymphadenopathy at least raises the possibility of a reactive lymphadenopathy from an underlying systemic/infectious/inflammatory Disorder. Symmetric hypermetabolism of the bilateral tonsils, likely reactive. And then underwent LN Bx 1-14 (benign) 2-1 (-) that showed lymphoid hyperplasia but did not show evidence of malignancy on cytology or flow cytometry.  Her highest temp at home has been 101. Usual high is 99.8.  Takes vicodin for back pain. OCPs. Prn zofran.   Sochx/Fhx reviewed.  Has "boyfirend" for last 6 months.   Review of Systems  Constitutional: Positive for appetite change. Negative for unexpected weight change.  Eyes: Negative for visual disturbance.  Gastrointestinal: Positive for constipation. Negative for diarrhea.  Genitourinary: Negative for difficulty urinating.  Musculoskeletal: Positive for back pain. Negative for myalgias, joint swelling and arthralgias.  Neurological: Negative for headaches.       Objective:   Physical Exam  Constitutional: She appears well-developed and well-nourished.  HENT:  Mouth/Throat: No oropharyngeal exudate.  Eyes: EOM are normal. Pupils are equal, round, and reactive to light.  Neck: Neck supple.  Cardiovascular: Normal rate, regular rhythm and normal heart sounds.   Pulmonary/Chest: Effort normal and breath sounds normal.  Abdominal: Soft. Bowel sounds are normal. She exhibits no distension. There is no tenderness.  Musculoskeletal:        Arms: Lymphadenopathy:    She has cervical adenopathy.          Assessment & Plan:

## 2015-01-04 ENCOUNTER — Telehealth: Payer: Self-pay | Admitting: *Deleted

## 2015-01-04 LAB — COMPREHENSIVE METABOLIC PANEL
ALT: 25 U/L (ref 0–35)
AST: 24 U/L (ref 0–37)
Albumin: 4.5 g/dL (ref 3.5–5.2)
Alkaline Phosphatase: 47 U/L (ref 39–117)
BUN: 9 mg/dL (ref 6–23)
CO2: 23 mEq/L (ref 19–32)
Calcium: 9.4 mg/dL (ref 8.4–10.5)
Chloride: 102 mEq/L (ref 96–112)
Creat: 0.56 mg/dL (ref 0.50–1.10)
Glucose, Bld: 89 mg/dL (ref 70–99)
Potassium: 3.8 mEq/L (ref 3.5–5.3)
Sodium: 137 mEq/L (ref 135–145)
Total Bilirubin: 0.4 mg/dL (ref 0.2–1.2)
Total Protein: 7.4 g/dL (ref 6.0–8.3)

## 2015-01-04 LAB — HEPATITIS B CORE ANTIBODY, TOTAL: Hep B Core Total Ab: NONREACTIVE

## 2015-01-04 LAB — ANCA SCREEN W REFLEX TITER
Atypical p-ANCA Screen: NEGATIVE
c-ANCA Screen: NEGATIVE
p-ANCA Screen: NEGATIVE

## 2015-01-04 LAB — HIV-1 RNA QUANT-NO REFLEX-BLD
HIV 1 RNA Quant: <20 copies/mL (ref <20)
HIV-1 RNA Quant, Log: <1.3 {Log} (ref <1.30)

## 2015-01-04 LAB — HEPATITIS B SURFACE ANTIBODY,QUALITATIVE: Hep B S Ab: POSITIVE — AB

## 2015-01-04 LAB — EBV AB TO VIRAL CAPSID AG PNL, IGG+IGM
EBV VCA IgG: 207 U/mL — ABNORMAL HIGH (ref <18.0)
EBV VCA IgM: 72.9 U/mL — ABNORMAL HIGH (ref <36.0)

## 2015-01-04 LAB — TSH: TSH: 0.709 u[IU]/mL (ref 0.350–4.500)

## 2015-01-04 LAB — HEPATITIS B SURFACE ANTIGEN: Hepatitis B Surface Ag: NEGATIVE

## 2015-01-04 LAB — HEPATITIS C ANTIBODY: HCV Ab: NEGATIVE

## 2015-01-04 LAB — RPR

## 2015-01-04 LAB — RHEUMATOID FACTOR: Rhuematoid fact SerPl-aCnc: <10 IU/mL (ref <=14)

## 2015-01-04 LAB — ANA: Anti Nuclear Antibody(ANA): NEGATIVE

## 2015-01-04 NOTE — Telephone Encounter (Signed)
Per dr Heywood Beneshadad,patient should contact her PCP for pain medication, patient verbalized understanding.

## 2015-01-04 NOTE — Telephone Encounter (Signed)
MRI Lumbar spine with/without contrast approved (CPT code 1324472158).  Approval good for 30 days.  Authorization: [id number] [facility] patient to schedule her for this study. Andree CossHowell, Michelle M, RN

## 2015-01-05 LAB — QUANTIFERON TB GOLD ASSAY (BLOOD)
Interferon Gamma Release Assay: NEGATIVE
Mitogen value: 5.87 IU/mL
Quantiferon Nil Value: 0.04 IU/mL
Quantiferon Tb Ag Minus Nil Value: 0 IU/mL
TB Ag value: 0.04 IU/mL

## 2015-01-05 LAB — TOXOPLASMA GONDII ANTIBODY, IGG: Toxoplasma IgG Ratio: <3 IU/mL (ref <7.2)

## 2015-01-05 LAB — CMV IGM: CMV IgM: <8 AU/mL (ref <30.00)

## 2015-01-05 LAB — CYTOMEGALOVIRUS ANTIBODY, IGG: Cytomegalovirus Ab-IgG: <0.2 U/mL (ref <0.60)

## 2015-01-05 LAB — TOXOPLASMA GONDII ANTIBODY, IGM: Toxoplasma Antibody- IgM: <3 IU/mL (ref <8.0)

## 2015-01-10 ENCOUNTER — Telehealth: Payer: Self-pay

## 2015-01-10 NOTE — Telephone Encounter (Signed)
Patient calling for lab results.  Please advise.   Tammy K King, RN  

## 2015-01-11 NOTE — Telephone Encounter (Signed)
She is Epstein bar virus positive. She appears to have "mono".  She f/u with me if she needs

## 2015-01-12 ENCOUNTER — Telehealth: Payer: Self-pay

## 2015-01-12 ENCOUNTER — Telehealth: Payer: Self-pay | Admitting: Infectious Diseases

## 2015-01-12 NOTE — Telephone Encounter (Signed)
Called pt back and explained her labs to her.  She has questions that she has persistent pain in her back. I suggested she follow up and get an MRI.  If she has persistent LAN would consider repeat LN bx.  She would like pain med refill (vicodin). I explained to her that we do not fill pain medications.  She is getting MRI done on Tuesday. I asked if she has a follow up appt and she stated she was going to seek care elsewhere.

## 2015-01-12 NOTE — Telephone Encounter (Signed)
Dr Ninetta LightsHatcher is attempting to call patient with results.    Message left for her to call me so I can direct her to Dr Ninetta LightsHatcher for a response to her concerns.    Laurell Josephsammy K King, RN

## 2015-01-14 ENCOUNTER — Other Ambulatory Visit: Payer: BLUE CROSS/BLUE SHIELD

## 2015-01-16 ENCOUNTER — Ambulatory Visit
Admission: RE | Admit: 2015-01-16 | Discharge: 2015-01-16 | Disposition: A | Discharge status: Home / Self Care | Payer: BLUE CROSS/BLUE SHIELD | Source: Ambulatory Visit | Attending: Infectious Diseases | Admitting: Infectious Diseases

## 2015-01-16 ENCOUNTER — Telehealth: Payer: Self-pay | Admitting: *Deleted

## 2015-01-16 DIAGNOSIS — R509 Fever, unspecified: Secondary | ICD-10-CM

## 2015-01-16 MED ORDER — GADOBENATE DIMEGLUMINE 529 MG/ML IV SOLN
20.0000 mL | Freq: Once | INTRAVENOUS | Status: AC | PRN
  Administered 2015-01-16: 20 mL via INTRAVENOUS

## 2015-01-16 NOTE — Telephone Encounter (Signed)
Patient came into clinic requesting a copy of her lab results. Copies made and given to patient. Advised her to sign a release at her new MD and we would be happy to fax the office note if requested.

## 2015-01-24 ENCOUNTER — Ambulatory Visit (HOSPITAL_BASED_OUTPATIENT_CLINIC_OR_DEPARTMENT_OTHER): Payer: BLUE CROSS/BLUE SHIELD | Admitting: Oncology

## 2015-01-24 VITALS — BP 113/72 | HR 61 | Temp 98.4°F | Resp 18 | Ht 64.0 in | Wt 232.9 lb

## 2015-01-24 DIAGNOSIS — R509 Fever, unspecified: Secondary | ICD-10-CM

## 2015-01-24 DIAGNOSIS — R52 Pain, unspecified: Secondary | ICD-10-CM

## 2015-01-24 DIAGNOSIS — R591 Generalized enlarged lymph nodes: Secondary | ICD-10-CM

## 2015-01-24 NOTE — Progress Notes (Signed)
Hematology and Oncology Follow Up Visit  Jacqueline [name] [id number] 1991-02-13 23 y.o. 01/24/2015 9:14 AM No PCP Per PatientNo ref. provider found   Principle Diagnosis: 24 year old woman with lymphadenopathy and fever biopsy have failed to show a lymphoproliferative disorder. This was diagnosed in November 2015. The likely etiology is infectious virus process.   Prior Therapy: She is status post ultrasound-guided biopsy on 11/23/2014 and had a repeat excisional biopsy on 12/11/2014. Pathology failed to reveal any lymphoproliferative disorder. She is status post excisional biopsy done by Dr. Lazarus Salines on 12/11/2014 and the pathology showed follicular hyperplasia.  Current therapy: Supportive care only.  Interim History: Jacqueline [name] presents today for a follow-up visit. Since the last visit, she is reporting significant improvement in her symptoms. She still have some palpable adenopathy but there are slowly improving. She does report back pain but have improved with meloxicam. Her appetite is better and she has not reported any fevers or chills or sweats. Her mono test showed positive results. She is not report any headaches, blurry vision, syncope or seizures. She does not report any chest pain, palpitation, orthopnea, leg edema. She does not report any shortness of breath, wheezing or hemoptysis. She does report nonproductive cough. She does not report any nausea, vomiting, abdominal pain, early satiety, constipation, diarrhea, hematochezia, melena or any changes in bowel habits. She does not report any neurological deficits such as neuropathy or upper extremity weakness. She does not report any petechiae or easy bruisability. She does report occasional anxiety but no depression. Rest of her review of systems unremarkable.   Medications:    Current Outpatient Prescriptions  Medication Sig Dispense Refill  . HYDROcodone-acetaminophen (NORCO/VICODIN) 5-325 MG per tablet Take 1-2 tablets by mouth  every 4 (four) hours as needed for moderate pain. Filled by Dr. Thornton Papas 30 tablet 0  . levonorgestrel-ethinyl estradiol (AVIANE,ALESSE,LESSINA) 0.1-20 MG-MCG tablet Take 1 tablet by mouth daily.    . meloxicam (MOBIC) 15 MG tablet Take 15 mg by mouth daily. with food  2  . Naproxen (NAPROSYN PO) Take 1 tablet by mouth 2 (two) times daily as needed. Uses OTC Naprosyn.    . ondansetron (ZOFRAN-ODT) 4 MG disintegrating tablet   2   No current facility-administered medications for this visit.     Allergies: No Known Allergies  Past Medical History, Surgical history, Social history, and Family History were reviewed and updated.  Physical Exam: Blood pressure 113/72, pulse 61, temperature 98.4 F (36.9 C), temperature source Oral, resp. rate 18, height  (1.626 m), weight 232 lb 14.4 oz (105.643 kg), last menstrual period 12/21/2014, SpO2 100 %. ECOG: 0 General appearance: alert and cooperative Head: Normocephalic, without obvious abnormality Neck: no adenopathy Lymph nodes: Cervical, supraclavicular, and axillary nodes normal. Heart:regular rate and rhythm, S1, S2 normal, no murmur, click, rub or gallop Lung:chest clear, no wheezing, rales, normal symmetric air entry Abdomin: soft, non-tender, without masses or organomegaly EXT:no erythema, induration, or nodules   Lab Results: Lab Results  Component Value Date   WBC 5.6 01/03/2015   HGB 12.3 01/03/2015   HCT 36.7 01/03/2015   MCV 82.3 01/03/2015   PLT 229 01/03/2015     Chemistry      Component Value Date/Time   NA 137 01/03/2015 1559   K 3.8 01/03/2015 1559   CL 102 01/03/2015 1559   CO2 23 01/03/2015 1559   BUN 9 01/03/2015 1559   CREATININE 0.56 01/03/2015 1559      Component Value Date/Time   CALCIUM 9.4  01/03/2015 1559   ALKPHOS 47 01/03/2015 1559   AST 24 01/03/2015 1559   ALT 25 01/03/2015 1559   BILITOT 0.4 01/03/2015 1559     EXAM: MRI LUMBAR SPINE WITHOUT AND WITH  CONTRAST  TECHNIQUE: Multiplanar and multiecho pulse sequences of the lumbar spine were obtained without and with intravenous contrast.  CONTRAST: 20mL MULTIHANCE GADOBENATE DIMEGLUMINE 529 MG/ML IV SOLN  COMPARISON: PET-CT 12/09/2014.  FINDINGS: Lumbar segmentation appears to be normal and will be designated as such for this report. Normal vertebral height and alignment. Visible bone marrow signal is normal throughout. No marrow edema or evidence of acute osseous abnormality. No abnormal enhancement identified in the visible spine.  Visualized lower thoracic spinal cord is normal with conus medularis at T12. Cauda equina nerve roots are normal.  Small retroperitoneal lymph nodes are stable. Negative visualized abdominal and pelvic viscera. Negative visualized posterior paraspinal soft tissues.  No disc herniation, spinal stenosis, or foraminal stenosis. Intermittent minimal to mild lumbar facet hypertrophy (e.g. L2-L3 greater on the left seen on series 6, image 11). No neural impingement identified.  IMPRESSION: Essentially normal lumbar MRI. Bone marrow signal appears normal throughout. No acute or inflammatory process identified.     Impression and Plan:   24 year old woman with the following issues:  1. Lymphadenopathy involving bilateral cervical axillary and inguinal nodes. Most likely related to viral etiology. Her EBV IgG level was elevated at 207 and IgM at 72. All likelihood this is the cause of her lymphadenopathy and body aches which seems to be improving. There is no evidence of lymphoma at this time. Her MRI was also reviewed and also was essentially normal.  2. Diffuse pain and body aches: Likely related to EBV infection as mentioned.  3. Follow-up: As needed at this time.   [facility] Hospital[facility]AD[facility],FIRAS, MD 3/16/20169:14 AM

## 2015-06-08 ENCOUNTER — Other Ambulatory Visit: Payer: Self-pay | Admitting: Family Medicine

## 2015-06-08 ENCOUNTER — Other Ambulatory Visit (HOSPITAL_COMMUNITY)
Admission: RE | Admit: 2015-06-08 | Discharge: 2015-06-08 | Disposition: A | Discharge status: Home / Self Care | Payer: BLUE CROSS/BLUE SHIELD | Source: Ambulatory Visit | Attending: Family Medicine | Admitting: Family Medicine

## 2015-06-08 DIAGNOSIS — Z124 Encounter for screening for malignant neoplasm of cervix: Secondary | ICD-10-CM | POA: Diagnosis not present

## 2015-06-13 LAB — CYTOLOGY - PAP

## 2015-11-11 DIAGNOSIS — I319 Disease of pericardium, unspecified: Secondary | ICD-10-CM

## 2015-11-11 HISTORY — DX: Disease of pericardium, unspecified: I31.9

## 2016-01-23 DIAGNOSIS — E559 Vitamin D deficiency, unspecified: Secondary | ICD-10-CM | POA: Diagnosis not present

## 2016-01-23 DIAGNOSIS — R5383 Other fatigue: Secondary | ICD-10-CM | POA: Diagnosis not present

## 2016-01-23 DIAGNOSIS — E282 Polycystic ovarian syndrome: Secondary | ICD-10-CM | POA: Diagnosis not present

## 2016-01-23 DIAGNOSIS — Z13220 Encounter for screening for lipoid disorders: Secondary | ICD-10-CM | POA: Diagnosis not present

## 2016-01-29 MED FILL — VIT D2 1.25 MG (50,000 UNIT: 1.25 MG | 84 days supply | Qty: 12 | Fill #0

## 2016-01-29 MED FILL — LEVONOR-ETH ESTRAD 0.1-0.02: 0.1-20 | 84 days supply | Qty: 84 | Fill #1

## 2016-02-27 DIAGNOSIS — E282 Polycystic ovarian syndrome: Secondary | ICD-10-CM | POA: Diagnosis not present

## 2016-03-11 DIAGNOSIS — E282 Polycystic ovarian syndrome: Secondary | ICD-10-CM | POA: Diagnosis not present

## 2016-03-11 DIAGNOSIS — R740 Nonspecific elevation of levels of transaminase and lactic acid dehydrogenase [LDH]: Secondary | ICD-10-CM | POA: Diagnosis not present

## 2016-03-11 DIAGNOSIS — R5382 Chronic fatigue, unspecified: Secondary | ICD-10-CM | POA: Diagnosis not present

## 2016-03-11 DIAGNOSIS — E781 Pure hyperglyceridemia: Secondary | ICD-10-CM | POA: Diagnosis not present

## 2016-03-11 DIAGNOSIS — R7309 Other abnormal glucose: Secondary | ICD-10-CM | POA: Diagnosis not present

## 2016-03-11 DIAGNOSIS — R635 Abnormal weight gain: Secondary | ICD-10-CM | POA: Diagnosis not present

## 2016-03-11 DIAGNOSIS — E559 Vitamin D deficiency, unspecified: Secondary | ICD-10-CM | POA: Diagnosis not present

## 2016-03-31 DIAGNOSIS — M940 Chondrocostal junction syndrome [Tietze]: Secondary | ICD-10-CM | POA: Diagnosis not present

## 2016-04-10 DIAGNOSIS — Z8679 Personal history of other diseases of the circulatory system: Secondary | ICD-10-CM

## 2016-04-10 HISTORY — DX: Personal history of other diseases of the circulatory system: Z86.79

## 2016-04-11 ENCOUNTER — Encounter: Payer: 59 | Attending: Endocrinology | Admitting: Dietician

## 2016-04-11 ENCOUNTER — Encounter: Payer: Self-pay | Admitting: Dietician

## 2016-04-11 VITALS — Ht 65.0 in | Wt 262.0 lb

## 2016-04-11 DIAGNOSIS — R7303 Prediabetes: Secondary | ICD-10-CM | POA: Diagnosis not present

## 2016-04-11 DIAGNOSIS — E669 Obesity, unspecified: Secondary | ICD-10-CM

## 2016-04-11 NOTE — Patient Instructions (Addendum)
Mindful eating, eat slowly, stop when satisfied or full. Eat away from TV and other electronics. Continue to choose whole grains. Keep an active lifestyle.  Aim for 30-60 minutes most days. Increase your intake of non starchy vegetables. Aim for 3 servings of carbohydrates servings (45 grams) per meal. 0-1 serving of carbohydrate per snack if hungry.

## 2016-04-11 NOTE — Progress Notes (Signed)
  Medical Nutrition Therapy:  Appt start time: 0800 end time:  0930.   Assessment:  Primary concerns today: Patient is here alone.  She recently went to see an endocrinologist.  Her HgbA1C was 5.9% with a diagnosis of prediabetes.  She would like to change this.  DM is very prevalent in her family and she states that she does not want that path.   She has gained about 50 lbs in the past year. Frustrated that she can't lose weight despite diet changes.  She did try "Whole 30 diet" and lost 3 lbs.  Followed a 1200 calorie diet and did not lose any.  Weight hx: Lowest adult weight 180 lbs Highest adult weight 262 lbs currently with 50 lb gain in the past year.  Hx also includes PCOS, vitamin D deficiency and Epstein Barr virus.  She sleeps about 9-10 hours per night. Patient lives with her boyfriend.  Patient does most of the shopping and cooking.  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  Preferred Learning Style:   No preference indicated   Learning Readiness:   Ready  MEDICATIONS: see list   DIETARY INTAKE: Was eating out more prior to diagnosis.  Now eats out less, eats a salad daily.   24-hr recall:  B ( AM): Breakfast burrito with Malaw[place]urkey sausage, egg, egg white, spinach, black beans, WW tortilla.  Starbucks coffee (Almond milk Cinnamon [place]).  But generally black coffee with 1 tablespoon creamer.  Snk ( AM): none L ( PM): small salad, fruit, leftover Chinese  Snk ( PM): occasional granola bar or nuts, occasionally chips D ( PM): tortilla chips and humus, fish, salad Snk ( PM): dark chocolate (2 Dove chocolate) Beverages: water, coffee with 1 T creamer  Usual physical activity: walking dogs and hiking on the weekends (used to go to the gym prior to health problems last year)  Estimated energy needs: 1500 calories 170 g carbohydrates 94 g protein 50 g fat  Progress Towards Goal(s):  In progress.   Nutritional Diagnosis:  NB-1.1 Food and  nutrition-related knowledge deficit As related to balance of carbohydrate, protein, and fat.  As evidenced by patient report.    Intervention:  Nutrition counseling/education related to prediabetes, weight and PCOS.  Discussed insulin resistance and ways to improve this.  Discussed importance of exercise.  Showed portion sizes with carbohydrate amounts.  Discussed mindful eating.  Discussed body weight and appropriate goals.  Mindful eating, eat slowly, stop when satisfied or full. Eat away from TV and other electronics. Continue to choose whole grains. Keep an active lifestyle.  Aim for 30-60 minutes most days. Increase your intake of non starchy vegetables. Aim for 3 servings of carbohydrates servings (45 grams) per meal. 0-1 serving of carbohydrate per snack if hungry.  Teaching Method Utilized:  Visual Auditory Hands on  Handouts given during visit include:  Meal plan card  Label reading  Snack list  2 articles on PCOS  Barriers to learning/adherence to lifestyle change: none  Demonstrated degree of understanding via:  Teach Back   Monitoring/Evaluation:  Dietary intake, exercise, label reading, and body weight prn.

## 2016-04-16 MED FILL — LEVONOR-ETH ESTRAD 0.1-0.02: 0.1-20 | 84 days supply | Qty: 84 | Fill #2

## 2016-04-21 DIAGNOSIS — R071 Chest pain on breathing: Secondary | ICD-10-CM | POA: Diagnosis not present

## 2016-04-21 DIAGNOSIS — R1013 Epigastric pain: Secondary | ICD-10-CM | POA: Diagnosis not present

## 2016-04-21 DIAGNOSIS — R0789 Other chest pain: Secondary | ICD-10-CM | POA: Diagnosis not present

## 2016-04-21 DIAGNOSIS — R52 Pain, unspecified: Secondary | ICD-10-CM | POA: Diagnosis not present

## 2016-04-21 DIAGNOSIS — R110 Nausea: Secondary | ICD-10-CM | POA: Diagnosis not present

## 2016-04-22 ENCOUNTER — Encounter (HOSPITAL_BASED_OUTPATIENT_CLINIC_OR_DEPARTMENT_OTHER): Payer: Self-pay

## 2016-04-22 ENCOUNTER — Emergency Department (HOSPITAL_BASED_OUTPATIENT_CLINIC_OR_DEPARTMENT_OTHER)
Admission: EM | Admit: 2016-04-22 | Discharge: 2016-04-23 | Disposition: A | Discharge status: Home / Self Care | Payer: 59 | Attending: Emergency Medicine | Admitting: Emergency Medicine

## 2016-04-22 ENCOUNTER — Emergency Department (HOSPITAL_BASED_OUTPATIENT_CLINIC_OR_DEPARTMENT_OTHER): Payer: 59

## 2016-04-22 DIAGNOSIS — R079 Chest pain, unspecified: Secondary | ICD-10-CM | POA: Insufficient documentation

## 2016-04-22 DIAGNOSIS — R7989 Other specified abnormal findings of blood chemistry: Secondary | ICD-10-CM

## 2016-04-22 DIAGNOSIS — R197 Diarrhea, unspecified: Secondary | ICD-10-CM | POA: Insufficient documentation

## 2016-04-22 DIAGNOSIS — I319 Disease of pericardium, unspecified: Secondary | ICD-10-CM | POA: Diagnosis not present

## 2016-04-22 DIAGNOSIS — R110 Nausea: Secondary | ICD-10-CM | POA: Diagnosis not present

## 2016-04-22 DIAGNOSIS — R0781 Pleurodynia: Secondary | ICD-10-CM | POA: Diagnosis not present

## 2016-04-22 DIAGNOSIS — R791 Abnormal coagulation profile: Secondary | ICD-10-CM | POA: Diagnosis not present

## 2016-04-22 LAB — COMPREHENSIVE METABOLIC PANEL
ALT: 59 U/L — ABNORMAL HIGH (ref 14–54)
AST: 48 U/L — ABNORMAL HIGH (ref 15–41)
Albumin: 4.4 g/dL (ref 3.5–5.0)
Alkaline Phosphatase: 63 U/L (ref 38–126)
Anion gap: 9 (ref 5–15)
BUN: 11 mg/dL (ref 6–20)
CO2: 24 mmol/L (ref 22–32)
Calcium: 9.6 mg/dL (ref 8.9–10.3)
Chloride: 104 mmol/L (ref 101–111)
Creatinine, Ser: 0.62 mg/dL (ref 0.44–1.00)
GFR calc Af Amer: >60 mL/min (ref >60)
GFR calc non Af Amer: >60 mL/min (ref >60)
Glucose, Bld: 101 mg/dL — ABNORMAL HIGH (ref 65–99)
Potassium: 3.6 mmol/L (ref 3.5–5.1)
Sodium: 137 mmol/L (ref 135–145)
Total Bilirubin: 0.4 mg/dL (ref 0.3–1.2)
Total Protein: 8.5 g/dL — ABNORMAL HIGH (ref 6.5–8.1)

## 2016-04-22 LAB — CBC WITH DIFFERENTIAL/PLATELET
Basophils Absolute: 0 10*3/uL (ref 0.0–0.1)
Basophils Relative: 0 %
Eosinophils Absolute: 0.1 10*3/uL (ref 0.0–0.7)
Eosinophils Relative: 2 %
HCT: 41.4 % (ref 36.0–46.0)
Hemoglobin: 13.9 g/dL (ref 12.0–15.0)
Lymphocytes Relative: 48 %
Lymphs Abs: 3.8 10*3/uL (ref 0.7–4.0)
MCH: 28.1 pg (ref 26.0–34.0)
MCHC: 33.6 g/dL (ref 30.0–36.0)
MCV: 83.8 fL (ref 78.0–100.0)
Monocytes Absolute: 0.5 10*3/uL (ref 0.1–1.0)
Monocytes Relative: 7 %
Neutro Abs: 3.4 10*3/uL (ref 1.7–7.7)
Neutrophils Relative %: 43 %
Platelets: 282 10*3/uL (ref 150–400)
RBC: 4.94 MIL/uL (ref 3.87–5.11)
RDW: 13.4 % (ref 11.5–15.5)
WBC: 7.8 10*3/uL (ref 4.0–10.5)

## 2016-04-22 LAB — PREGNANCY, URINE: Preg Test, Ur: NEGATIVE

## 2016-04-22 LAB — TROPONIN I: Troponin I: <0.03 ng/mL (ref <0.031)

## 2016-04-22 LAB — BRAIN NATRIURETIC PEPTIDE: B Natriuretic Peptide: 18.9 pg/mL (ref 0.0–100.0)

## 2016-04-22 MED ORDER — ONDANSETRON HCL 4 MG/2ML IJ SOLN
4.0000 mg | Freq: Once | INTRAMUSCULAR | Status: AC
  Administered 2016-04-22: 4 mg via INTRAVENOUS
  Filled 2016-04-22: qty 2

## 2016-04-22 MED ORDER — IOPAMIDOL (ISOVUE-370) INJECTION 76%
100.0000 mL | Freq: Once | INTRAVENOUS | Status: AC | PRN
  Administered 2016-04-22: 100 mL via INTRAVENOUS

## 2016-04-22 NOTE — ED Notes (Signed)
C/o CP x 1 month-seen by PCP Monday-called tonight to advise elevated d-dimer "0.98" and to come to ED

## 2016-04-22 NOTE — ED Provider Notes (Signed)
CSN: [id number]     Arrival date & time 04/22/16  2046 History   First MD Initiated Contact with Patient 04/22/16 2128     Chief Complaint  Patient presents with  . Abnormal Lab     (Consider location/radiation/quality/duration/timing/severity/associated sxs/prior Treatment) HPI Jacqueline [name] is a 25 y.o. female who presents to the Emergency Department for an elevated d-dimer. Pt has been having gradual onset, constant, dull, central chest pain for the past month. She was seen at UC 3 weeks ago for this pain and was told that she has inflammation in her chest. She was given prescriptions for Meloxicam and muscle relaxants. Pt did not take the muscle relaxants but took the meloxicam with no relief. She was seen again by her PCP yesterday and had and EKG and blood work done. Pt was called tonight and was told that her D-dimer was high and that she should go to the ED. Her chest pain is exacerbated with deep inspirations and laying flat. Pain is sharp with deep breaths. She also complains of nausea for the past 5 days with diarrhea. Denies any other associated symptoms. No smoking, no immediate family hx of CAD however notes it in grandparents.   Past Medical History  Diagnosis Date  . PCOS (polycystic ovarian syndrome)   . Adenopathy     left neck   Past Surgical History  Procedure Laterality Date  . Wisdom tooth extraction    . Lymph node biopsy Left 12/11/2014    Procedure: LYMPH NODE BIOPSY;  Surgeon: Flo Shanks, MD;  Location: [facility];  Service: ENT;  Laterality: Left;   Family History  Problem Relation Age of Onset  . Diabetes Mother   . Hypertension Mother   . Cancer Maternal Grandmother     hodgkin's lymphoma  . Diabetes Paternal Grandmother   . Heart disease Paternal Grandmother   . Heart disease Paternal Grandfather    Social History  Substance Use Topics  . Smoking status: Never Smoker   . Smokeless tobacco: Never Used  . Alcohol Use: 0.0 oz/week     0 Standard drinks or equivalent per week     Comment: social   OB History    No data available     Review of Systems  Constitutional: Negative for fever.  HENT: Negative for sore throat.   Eyes: Negative for visual disturbance.  Respiratory: Negative for cough and shortness of breath.   Cardiovascular: Positive for chest pain. Negative for leg swelling.  Gastrointestinal: Positive for nausea and diarrhea. Negative for vomiting, abdominal pain and constipation.  Genitourinary: Negative for difficulty urinating.  Musculoskeletal: Negative for back pain and neck pain.  Skin: Negative for rash.  Neurological: Negative for syncope and headaches.      Allergies  Indomethacin  Home Medications   Prior to Admission medications   Medication Sig Start Date End Date Taking? Authorizing Provider  colchicine 0.6 MG tablet Take 1 tablet (0.6 mg total) by mouth 2 (two) times daily. 04/23/16 05/23/16  Alvira Monday, MD  ibuprofen (ADVIL,MOTRIN) 600 MG tablet Take 600 mg by mouth every 6 (six) hours as needed.    Historical Provider, MD  levonorgestrel-ethinyl estradiol (AVIANE,ALESSE,LESSINA) 0.1-20 MG-MCG tablet Take 1 tablet by mouth daily.    Historical Provider, MD  meloxicam (MOBIC) 15 MG tablet Take 15 mg by mouth daily. with food 01/18/15   Historical Provider, MD  predniSONE (DELTASONE) 10 MG tablet Take 4 tablets (40 mg total) by mouth daily. 04/23/16 04/27/16  Alvira MondayErin Schlossman, MD   BP 120/79 mmHg  Pulse 82  Temp(Src) 98.1 F (36.7 C) (Oral)  Resp 16  Wt 261 lb (118.389 kg)  SpO2 99%  LMP 04/03/2016 Physical Exam  Constitutional: She is oriented to person, place, and time. She appears well-developed and well-nourished. No distress.  HENT:  Head: Normocephalic and atraumatic.  Eyes: Conjunctivae and EOM are normal.  Neck: Normal range of motion.  Cardiovascular: Normal rate, regular rhythm, normal heart sounds and intact distal pulses.  Exam reveals no gallop and no friction  rub.   No murmur heard. Pulmonary/Chest: Effort normal and breath sounds normal. No respiratory distress. She has no wheezes. She has no rales. She exhibits tenderness (reports very mild).  Abdominal: Soft. She exhibits no distension. There is no tenderness. There is no guarding.  Musculoskeletal: She exhibits no edema or tenderness.  Neurological: She is alert and oriented to person, place, and time.  Skin: Skin is warm and dry. No rash noted. She is not diaphoretic. No erythema.  Nursing note and vitals reviewed.   ED Course  Procedures (including critical care time) Labs Review Labs Reviewed  COMPREHENSIVE METABOLIC PANEL - Abnormal; Notable for the following:    Glucose, Bld 101 (*)    Total Protein 8.5 (*)    AST 48 (*)    ALT 59 (*)    All other components within normal limits  PREGNANCY, URINE  CBC WITH DIFFERENTIAL/PLATELET  TROPONIN I  BRAIN NATRIURETIC PEPTIDE    Imaging Review Ct Angio Chest Pe W/cm &/or Wo Cm  04/22/2016  CLINICAL DATA:  25 year old with mid upper chest pain for 1 month. EXAM: CT ANGIOGRAPHY CHEST WITH CONTRAST TECHNIQUE: Multidetector CT imaging of the chest was performed using the standard protocol during bolus administration of intravenous contrast. Multiplanar CT image reconstructions and MIPs were obtained to evaluate the vascular anatomy. CONTRAST:  100 mL Isovue 370 COMPARISON:  PET-CT 12/09/2014 FINDINGS: Mediastinum/Lymph Nodes: Suboptimal evaluation of the pulmonary arteries due to motion artifact and poor opacification of the pulmonary arteries. Poor pulmonary artery opacification is secondary to body habitus and timing of imaging. There is no evidence for a large central pulmonary embolism but the study is essentially nondiagnostic for pulmonary emboli. Again noted are small axillary lymph nodes. The nodes in the left axilla have slightly decreased in size. No gross abnormality to the thoracic aorta but limited evaluation. No significant  mediastinal or hilar lymphadenopathy. No large pericardial effusion. Lungs/Pleura: Trachea and mainstem bronchi are patent. Both lungs are clear. No significant airspace disease or consolidation. There appears to be focal air trapping along the medial left upper lobe. Upper abdomen: No acute abnormality in the upper abdomen. Musculoskeletal: No acute bone abnormality. Review of the MIP images confirms the above findings. IMPRESSION: Study is nondiagnostic and cannot evaluate for pulmonary emboli. The reason for this nondiagnostic examination is multifactorial as described. No acute chest findings. Electronically Signed   By: Richarda OverlieAdam  Henn M.D.   On: 04/22/2016 22:56   I have personally reviewed and evaluated these images and lab results as part of my medical decision-making.   EKG Interpretation   Date/Time:  Tuesday April 22 2016 23:02:31 EDT Ventricular Rate:  88 PR Interval:  160 QRS Duration: 89 QT Interval:  359 QTC Calculation: 434 R Axis:   57 Text Interpretation:  Sinus rhythm Q wave inferior leads PR depression  lateral and inferior leads, concerning for possible pericarditis No  previous ECGs available Confirmed by [facility] CenterCHLOSSMAN MD, ERIN ([id number]) on  04/22/2016 11:56:13 PM      MDM   Final diagnoses:  Elevated d-dimer  Pericarditis  Chest pain, unspecified chest pain type    25 year old female with prior history of fever of unknown origin, lymphadenopathy, who underwent evaluation with infectious disease and hematology and oncology, later found to have Epstein-Barr virus, polycystic ovarian syndrome, presents with concern for chest pain. Testing hasn't present for approximately one month, and she was previously seen at urgent care and told it was likely musculoskeletal, however sign her PCP and had a d-dimer that was elevated to 0.98 and was recommended to present to the emergency department. Troponin here is negative, BNP is within normal limits.  Given elevated d-dimer obtained at  PCP, CT PE study was ordered. Unfortunately, this CT was severely limited by body habitus, bolus timing, and motion artifact, and we cannot clearly evaluate for pulmonary embolus. There is no sign of large pulmonary embolus in proximal vessels, patient has no dyspnea, no hypoxia, no tachypnea, and overall have low suspicion for pulmonary embolus, and feel risks of initiating anticoagulation, or repeating scan at this time outweigh benefit.   Patient describes a chest pain which is positional, specifically relieved by sitting up, is pleuritic, without associated dyspnea, and has PR depressions present in many leads, concerning for possible pericarditis. Patient has been taking anti-inflammatory medications as an outpatient without relief. CT did not show large pericardial effusion, and brief bedside echo was limited, however also did not show large effusion.  Recommend continuing NSAIDs as well as given rx for colchicine as an outpt. Given pt with some intolerance to NSAIDs and symptoms despite NSAIDs, will also give short course of steroids.  Recommend Cardiology follow up for chest pain, with possible pericarditis or other. Patient discharged in stable condition with understanding of reasons to return.   Alvira Monday, MD 04/23/16 0230

## 2016-04-23 MED ORDER — PREDNISONE 10 MG PO TABS: 40.0000 mg | ORAL_TABLET | Freq: Every day | ORAL | Status: AC

## 2016-04-23 MED ORDER — COLCHICINE 0.6 MG PO TABS: 0.6000 mg | ORAL_TABLET | Freq: Two times a day (BID) | ORAL | Status: DC

## 2016-04-23 MED FILL — COLCHICINE 0.6 MG TABLET: 0.6 | 30 days supply | Qty: 60 | Fill #0

## 2016-04-23 MED FILL — predniSONE 10 MG TABS: 10 | 4 days supply | Qty: 15 | Fill #0

## 2016-04-23 MED FILL — ONDANSETRON HCL 4 MG TABLET: 4 | 6 days supply | Qty: 20 | Fill #0

## 2016-04-23 NOTE — ED Notes (Signed)
MD at bedside. 

## 2016-04-23 NOTE — Discharge Instructions (Signed)
Pericarditis °Pericarditis is swelling (inflammation) of the pericardium. The pericardium is a thin, double-layered, fluid-filled tissue sac that surrounds the heart. The purpose of the pericardium is to contain the heart in the chest cavity and keep the heart from overexpanding. Different types of pericarditis can occur, such as: °· Acute pericarditis. Inflammation can develop suddenly in acute pericarditis. °· Chronic pericarditis. Inflammation develops gradually and is long-lasting in chronic pericarditis. °· Constrictive pericarditis. In this type of pericarditis, the layers of the pericardium stiffen and develop scar tissue. The scar tissue thickens and sticks together. This makes it difficult for the heart to pump and work as it normally does. °CAUSES  °Pericarditis can be caused from different conditions, such as: °· A bacterial, fungal or viral infection. °· After a heart attack (myocardial infarction). °· After open-heart surgery (coronary bypass graft surgery). °· Auto-immune conditions such as lupus, rheumatoid arthritis or scleroderma. °· Kidney failure. °· Low thyroid condition (hypothyroidism). °· Cancer from another part of the body that has spread (metastasized) to the pericardium. °· Chest injury or trauma. °· After radiation treatment. °· Certain medicines. °SYMPTOMS  °Symptoms of pericarditis can include: °· Chest pain. Chest pain symptoms may increase when laying down and may be relieved when sitting up and leaning forward. °· A chronic, dry cough. °· Heart palpitations. These may feel like rapid, fluttering or pounding heart beats. °· Chest pain may be worse when swallowing. °· Dizziness or fainting. °· Tiredness, fatigue or lethargy. °· Fever. °DIAGNOSIS  °Pericarditis is diagnosed by the following: °· A physical exam. A heart sound called a pericardial friction rub may be heard when your caregiver listens to your heart. °· Blood work. Blood may be drawn to check for an infection and to look at  your blood chemistry. °· Electrocardiography. During electrocardiography your heart's electrical activity is monitored and recorded with a tracing on paper (electrocardiogram [ECG]). °· Echocardiography. °· Computed tomography (CT). °· Magnetic resonance image (MRI). °TREATMENT  °To treat pericarditis, it is important to know the cause of it. The cause of pericarditis determines the treatment.  °· If the cause of pericarditis is due to an infection, treatment is based on the type of infection. If an infection is suspected in the pericardial fluid, a procedure called a pericardial fluid culture and biopsy may be done. This takes a sample of the pericardial fluid. The sample is sent to a lab which runs tests on the pericardial fluid to check for an infection. °· If the autoimmune disease is the cause, treatment of the autoimmune condition will help improve the pericarditis. °· If the cause of pericarditis is not known, anti-inflammatory medicines may be used to help decrease the inflammation. °· Surgery may be needed. The following are types of surgeries or procedures that may be done to treat pericarditis: °¨ Pericardial window. A pericardial window makes a cut (incision) into the pericardial sac. This allows excess fluid in the pericardium to drain. °¨ Pericardiocentesis. A pericardiocentesis is also known as a pericardial tap. This procedure uses a needle that is guided by X-ray to drain (aspirate) excess fluid from the pericardium. °¨ Pericardiectomy. A pericardiectomy removes part or all of the pericardium. °HOME CARE INSTRUCTIONS  °· Do not smoke. If you smoke, quit. Your caregiver can help you quit smoking. °· Maintain a healthy weight. °· Follow an exercise program as directed by your health care provider. You may need to limit your exercising until your symptoms go away. °· If you drink alcohol, do so in moderation. °·   Eat a heart healthy diet. A registered dietitian can help you learn about healthy food  choices. °· Keep a list of all your medicines with you at all times. Include the name, dose, how often it is taken and how it is taken. °SEEK IMMEDIATE MEDICAL CARE IF:  °· You have chest pain or feelings of chest pressure. °· You have sweating (diaphoresis) when at rest. °· You have irregular heartbeats (palpitations). °· You have rapid, racing heart beats. °· You have unexplained fainting episodes. °· You feel sick to your stomach (nausea) or vomiting without cause. °· You have unexplained weakness. °If you develop any of the symptoms which originally made you seek care, call for local emergency medical help. Do not drive yourself to the hospital. °  °This information is not intended to replace advice given to you by your health care provider. Make sure you discuss any questions you have with your health care provider. °  °Document Released: 04/22/2001 Document Revised: 03/13/2015 Document Reviewed: 05/09/2015 °Elsevier Interactive Patient Education ©2016 [employer] ° °

## 2016-04-24 ENCOUNTER — Telehealth: Payer: Self-pay | Admitting: Cardiovascular Disease

## 2016-04-24 NOTE — Telephone Encounter (Signed)
04/24/2016 [facility] upcoming appointment with Dr. Allyson SabalBerry on 04/30/2016.  [facility]

## 2016-04-30 ENCOUNTER — Encounter: Payer: Self-pay | Admitting: Cardiovascular Disease

## 2016-04-30 ENCOUNTER — Ambulatory Visit (INDEPENDENT_AMBULATORY_CARE_PROVIDER_SITE_OTHER): Payer: 59 | Admitting: Cardiovascular Disease

## 2016-04-30 VITALS — BP 114/70 | HR 61 | Ht 65.0 in | Wt 259.0 lb

## 2016-04-30 DIAGNOSIS — R079 Chest pain, unspecified: Secondary | ICD-10-CM | POA: Diagnosis not present

## 2016-04-30 NOTE — Assessment & Plan Note (Signed)
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 There is occasional radiation to her jaw and arm on the left side but no shortness of breath. She was seen in urgent care and treated with an H2 blocker, prednisone and colchicine as well as a nonsteroidal medication. Because her blood work showed an elevated D dimer CT angiogram was performed and was not diagnostic. She does not have a rub on exam. I'm going to get a 2-D echocardiogram and a GXT because of her jaw and arm radiation I will see her back after that. She is happy and she may need a longer prednisone taper.

## 2016-04-30 NOTE — Progress Notes (Signed)
04/30/2016 Jacqueline [name]   08/12/1991  [id number]  Primary Physician Cain Saupe, MD Primary Cardiologist: Runell Gess MD [name]  HPI:  Jacqueline [name] is a 25 year old moderately overweight single Caucasian female no children and works as a Journalist, newspaper. Her primary care provider is Dr. Cain Saupe. . She has a history of Epstein-Barr virus backin the fall last year diagnosed by Dr. Ninetta Lights. She developed chest pain about 6 weeks ago worse when she was recumbent. She was seen at an urgent care center and treated with prednisone, H2 blocker, nonsteroidal and colchicine. Blood work was remarkable foran elevated d-dimer which resulted inER evaluation and a CT intervention to rule out pulmonary embolus which was not diagnostic.She says that her chest pain somewhat improved with the prednisone but is still present. She also complains of random discomfort in her left jaw and arm.   Current Outpatient Prescriptions  Medication Sig Dispense Refill  . colchicine 0.6 MG tablet Take 1 tablet (0.6 mg total) by mouth 2 (two) times daily. 60 tablet 0  . levonorgestrel-ethinyl estradiol (AVIANE,ALESSE,LESSINA) 0.1-20 MG-MCG tablet Take 1 tablet by mouth daily.    . meloxicam (MOBIC) 15 MG tablet Take 15 mg by mouth daily. with food  2  . ondansetron (ZOFRAN) 4 MG tablet Take 4 mg by mouth as needed.     No current facility-administered medications for this visit.    Allergies  Allergen Reactions  . Indomethacin Other (See Comments)    Blood in stool     Social History   Social History  . Marital Status: Single    Spouse Name: N/A  . Number of Children: N/A  . Years of Education: N/A   Occupational History  . Not on file.   Social History Main Topics  . Smoking status: Never Smoker   . Smokeless tobacco: Never Used  . Alcohol Use: 0.0 oz/week    0 Standard drinks or equivalent per week     Comment: social  . Drug Use: No  . Sexual  Activity: Yes    Birth Control/ Protection: Pill   Other Topics Concern  . Not on file   Social History Narrative     Review of Systems: General: negative for chills, fever, night sweats or weight changes.  Cardiovascular: negative for chest pain, dyspnea on exertion, edema, orthopnea, palpitations, paroxysmal nocturnal dyspnea or shortness of breath Dermatological: negative for rash Respiratory: negative for cough or wheezing Urologic: negative for hematuria Abdominal: negative for nausea, vomiting, diarrhea, bright red blood per rectum, melena, or hematemesis Neurologic: negative for visual changes, syncope, or dizziness All other systems reviewed and are otherwise negative except as noted above.    Blood pressure 114/70, pulse 61, height  (1.651 m), weight 259 lb (117.482 kg), last menstrual period 04/03/2016.  General appearance: alert and no distress Neck: no adenopathy, no carotid bruit, no JVD, supple, symmetrical, trachea midline and thyroid not enlarged, symmetric, no tenderness/mass/nodules Lungs: clear to auscultation bilaterally Heart: regular rate and rhythm, S1, S2 normal, no murmur, click, rub or gallop Extremities: extremities normal, atraumatic, no cyanosis or edema  EKG not performed today  ASSESSMENT AND PLAN:   Chest pain Jacqueline [name] is a 25 year old mildly overweight single Caucasian female who works as a Associate Professor at [employer] was referred for further evaluation of atypical chest pain.She developed this approximately 6 weeks ago.The penis positional worse when she lies down at night. There is occasional radiation to  her jaw and arm on the left side but no shortness of breath. She was seen in urgent care and treated with an H2 blocker, prednisone and colchicine as well as a nonsteroidal medication. Because her blood work showed an elevated D dimer CT angiogram was performed and was not diagnostic. She does not have a rub on exam. I'm going to  get a 2-D echocardiogram and a GXT because of her jaw and arm radiation I will see her back after that. She is happy and she may need a longer prednisone taper.      Runell GessJonathan J. Berry MD FACP,FACC,FAHA, [facility] 04/30/2016 12:03 PM

## 2016-04-30 NOTE — Patient Instructions (Signed)
Medication Instructions:  Your physician recommends that you continue on your current medications as directed. Please refer to the Current Medication list given to you today.   Labwork: Your physician recommends that you [facility] for lab work [facility].   Testing/Procedures: Your physician has requested that you have an echocardiogram. Echocardiography is a painless test that uses sound waves to create images of your heart. It provides your doctor with information about the size and shape of your heart and how well your heart's chambers and valves are working. This procedure takes approximately one hour. There are no restrictions for this procedure.  Your physician has requested that you have an exercise tolerance test. For further information please visit [email or web address]. Please also follow instruction sheet, as given.    Follow-Up: Your physician recommends that you schedule a follow-up appointment in: 1-3 WEEKS WITH DR Allyson SabalBERRY AFTER TESTS ARE COMPLETE.   Any Other Special Instructions Will Be Listed Below (If Applicable).     If you need a refill on your cardiac medications before your next appointment, please call your pharmacy.

## 2016-05-07 ENCOUNTER — Ambulatory Visit (HOSPITAL_BASED_OUTPATIENT_CLINIC_OR_DEPARTMENT_OTHER)
Admission: RE | Admit: 2016-05-07 | Discharge: 2016-05-07 | Disposition: A | Discharge status: Home / Self Care | Payer: 59 | Source: Ambulatory Visit | Attending: Cardiovascular Disease | Admitting: Cardiovascular Disease

## 2016-05-07 ENCOUNTER — Ambulatory Visit (HOSPITAL_COMMUNITY)
Admission: RE | Admit: 2016-05-07 | Discharge: 2016-05-07 | Disposition: A | Discharge status: Home / Self Care | Payer: 59 | Source: Ambulatory Visit | Attending: Cardiovascular Disease | Admitting: Cardiovascular Disease

## 2016-05-07 DIAGNOSIS — R079 Chest pain, unspecified: Secondary | ICD-10-CM

## 2016-05-07 LAB — ECHOCARDIOGRAM COMPLETE
E decel time: 259 msec
E/e' ratio: 7.45
FS: 40 % (ref 28–44)
IVS/LV PW RATIO, ED: 0.97
LA ID, A-P, ES: 38 mm
LA diam end sys: 38 mm
LA diam index: 1.72 cm/m2
LA vol A4C: 35.9 ml
LA vol index: 16.3 mL/m2
LA vol: 36 mL
LV E/e' medial: 7.45
LV E/e'average: 7.45
LV PW d: 7.16 mm — AB (ref 0.6–1.1)
LV e' LATERAL: 13.7 cm/s
LVOT SV: 92 mL
LVOT VTI: 26.5 cm
LVOT area: 3.46 cm2
LVOT diameter: 21 mm
LVOT peak grad rest: 6 mmHg
LVOT peak vel: 122 cm/s
MV Dec: 259
MV Peak grad: 4 mmHg
MV pk A vel: 46.6 m/s
MV pk E vel: 102 m/s
TAPSE: 22 mm
TDI e' lateral: 13.7
TDI e' medial: 9.03

## 2016-05-07 NOTE — Progress Notes (Signed)
Echocardiogram 2D Echocardiogram has been performed.  Jacqueline [name] 05/07/2016, 9:57 AM

## 2016-05-09 ENCOUNTER — Encounter: Payer: Self-pay | Admitting: Cardiovascular Disease

## 2016-05-09 ENCOUNTER — Ambulatory Visit (INDEPENDENT_AMBULATORY_CARE_PROVIDER_SITE_OTHER): Payer: 59 | Admitting: Cardiovascular Disease

## 2016-05-09 VITALS — BP 122/66 | HR 69 | Ht 64.0 in | Wt 258.0 lb

## 2016-05-09 DIAGNOSIS — R079 Chest pain, unspecified: Secondary | ICD-10-CM | POA: Diagnosis not present

## 2016-05-09 NOTE — Progress Notes (Signed)
Jacqueline [name] returns for follow-up for outpatient tests. Her 2-D echo was entirely normal as was a routine GXT. Her chest pain has completely resolved although she is not tolerating the colchicine which I told her to discontinue. She may have had some transient pericarditis which has since resolved. I will see her back when necessary

## 2016-05-09 NOTE — Assessment & Plan Note (Signed)
Jacqueline [name] returns for follow-up for outpatient tests. Her 2-D echo was entirely normal as was a routine GXT. Her chest pain has completely resolved although she is not tolerating the colchicine which I told her to discontinue. She may have had some transient pericarditis which has since resolved. I will see her back when necessary 

## 2016-05-09 NOTE — Patient Instructions (Signed)
Medication Instructions:  Your physician recommends that you continue on your current medications as directed. Please refer to the Current Medication list given to you today.    Follow-Up: Your physician recommends that you schedule a follow-up appointment ON AN AS NEEDED BASIS.   Any Other Special Instructions Will Be Listed Below (If Applicable).     If you need a refill on your cardiac medications before your next appointment, please call your pharmacy.   

## 2016-05-21 DIAGNOSIS — R1013 Epigastric pain: Secondary | ICD-10-CM | POA: Diagnosis not present

## 2016-05-21 DIAGNOSIS — R110 Nausea: Secondary | ICD-10-CM | POA: Diagnosis not present

## 2016-05-21 DIAGNOSIS — Z791 Long term (current) use of non-steroidal anti-inflammatories (NSAID): Secondary | ICD-10-CM | POA: Diagnosis not present

## 2016-05-22 LAB — EXERCISE TOLERANCE TEST
Estimated workload: 8.3 METS
Exercise duration (min): 6 min
Exercise duration (sec): 53 s
MPHR: 194 {beats}/min
Peak HR: 176 {beats}/min
Percent HR: 90 %
Rest HR: 69 {beats}/min

## 2016-05-24 ENCOUNTER — Encounter (HOSPITAL_BASED_OUTPATIENT_CLINIC_OR_DEPARTMENT_OTHER): Payer: Self-pay | Admitting: Emergency Medicine

## 2016-05-24 ENCOUNTER — Emergency Department (HOSPITAL_BASED_OUTPATIENT_CLINIC_OR_DEPARTMENT_OTHER)
Admission: EM | Admit: 2016-05-24 | Discharge: 2016-05-25 | Disposition: A | Discharge status: Home / Self Care | Payer: 59 | Attending: Emergency Medicine | Admitting: Emergency Medicine

## 2016-05-24 DIAGNOSIS — R1012 Left upper quadrant pain: Secondary | ICD-10-CM | POA: Insufficient documentation

## 2016-05-24 DIAGNOSIS — R1032 Left lower quadrant pain: Secondary | ICD-10-CM | POA: Diagnosis not present

## 2016-05-24 DIAGNOSIS — R109 Unspecified abdominal pain: Secondary | ICD-10-CM

## 2016-05-24 DIAGNOSIS — R52 Pain, unspecified: Secondary | ICD-10-CM

## 2016-05-24 LAB — URINALYSIS, ROUTINE W REFLEX MICROSCOPIC
Bilirubin Urine: NEGATIVE
Glucose, UA: NEGATIVE mg/dL
Hgb urine dipstick: NEGATIVE
Ketones, ur: NEGATIVE mg/dL
Leukocytes, UA: NEGATIVE
Nitrite: NEGATIVE
Protein, ur: NEGATIVE mg/dL
Specific Gravity, Urine: 1.023 (ref 1.005–1.030)
pH: 6.5 (ref 5.0–8.0)

## 2016-05-24 LAB — PREGNANCY, URINE: Preg Test, Ur: NEGATIVE

## 2016-05-24 NOTE — ED Provider Notes (Signed)
CSN: [id number]     Arrival date & time 05/24/16  2307 History  By signing my name below, I, Jacqueline [name], attest that this documentation has been prepared under the direction and in the presence of Jacqueline Palumbo, MD. Electronically Signed: [name], ED Scribe. 05/25/2016. 12:08 AM.      Chief Complaint  Patient presents with  . Abdominal Pain   Patient is a 25 y.o. female presenting with abdominal pain. The history is provided by the patient. No language interpreter was used.  Abdominal Pain Pain location:  LUQ (LMQ) Pain quality: dull   Pain radiates to:  Does not radiate Pain severity:  Moderate Onset quality:  Gradual Duration:  10 days Timing:  Intermittent Progression:  Unchanged Chronicity:  New Context: eating   Context: not diet changes, not retching and not trauma   Relieved by:  Nothing Worsened by:  Eating Ineffective treatments:  None tried Associated symptoms: no constipation, no dysuria, no fever, no hematochezia, no hematuria, no nausea, no shortness of breath and no vomiting   Risk factors: not pregnant    HPI Comments: Jacqueline [name] is a 25 y.o. female who presents to the Emergency Department complaining of left sided dull abdominal pain onset 10 days prior. Pt reports she has some loose orange stool. She states that the pain is worse after eating. Pt reports that she recently had all blood work at her PCP for similar symptoms. Denies taking any supplements. Pt does report that she is on birth control. Denies dysuria, hematuria or blood in stool. Reports Hx of pericarditis and that she recently finished taking anti inflammatories about 5 days prior to the onset of pain.    Past Medical History  Diagnosis Date  . PCOS (polycystic ovarian syndrome)   . Adenopathy     left neck   Past Surgical History  Procedure Laterality Date  . Wisdom tooth extraction    . Lymph node biopsy Left 12/11/2014    Procedure: LYMPH NODE BIOPSY;  Surgeon: Flo Shanks,  MD;  Location: [facility];  Service: ENT;  Laterality: Left;   Family History  Problem Relation Age of Onset  . Diabetes Mother   . Hypertension Mother   . Cancer Maternal Grandmother     hodgkin's lymphoma  . Diabetes Paternal Grandmother   . Heart disease Paternal Grandmother   . Heart disease Paternal Grandfather    Social History  Substance Use Topics  . Smoking status: Never Smoker   . Smokeless tobacco: Never Used  . Alcohol Use: 0.0 oz/week    0 Standard drinks or equivalent per week     Comment: social   OB History    No data available     Review of Systems  Constitutional: Negative for fever.  Respiratory: Negative for shortness of breath.   Gastrointestinal: Positive for abdominal pain. Negative for nausea, vomiting, constipation and hematochezia.  Genitourinary: Negative for dysuria and hematuria.  All other systems reviewed and are negative.     Allergies  Colchicine and Indomethacin  Home Medications   Prior to Admission medications   Medication Sig Start Date End Date Taking? Authorizing Provider  colchicine 0.6 MG tablet Take 1 tablet (0.6 mg total) by mouth 2 (two) times daily. 04/23/16 05/23/16  Alvira Monday, MD  levonorgestrel-ethinyl estradiol (AVIANE,ALESSE,LESSINA) 0.1-20 MG-MCG tablet Take 1 tablet by mouth daily.    Historical Provider, MD  meloxicam (MOBIC) 15 MG tablet Take 15 mg by mouth daily. with food 01/18/15  Historical Provider, MD  ondansetron (ZOFRAN) 4 MG tablet Take 4 mg by mouth as needed. 04/23/16   Historical Provider, MD   BP 106/69 mmHg  Pulse 82  Temp(Src) 98.9 F (37.2 C) (Oral)  Resp 18  Ht 5\' 4"  (1.626 m)  Wt 258 lb (117.028 kg)  BMI 44.26 kg/m2  SpO2 100%   Physical Exam  Constitutional: She is oriented to person, place, and time. She appears well-developed and well-nourished. No distress.  HENT:  Head: Normocephalic and atraumatic.  Mouth/Throat: Oropharynx is clear and moist. No oropharyngeal  exudate.  Eyes: Conjunctivae and EOM are normal. Pupils are equal, round, and reactive to light.  Neck: Normal range of motion. Neck supple. Carotid bruit is not present. No tracheal deviation present.  Cardiovascular: Normal rate, regular rhythm and normal heart sounds.   Pulses:      Radial pulses are 2+ on the right side, and 2+ on the left side.       Dorsalis pedis pulses are 2+ on the right side, and 2+ on the left side.  Pulmonary/Chest: Effort normal and breath sounds normal. No stridor. No respiratory distress. She has no wheezes. She has no rales.  Abdominal: Soft. Bowel sounds are normal. She exhibits no distension and no mass. There is no hepatosplenomegaly. There is no tenderness. There is no rigidity, no rebound, no guarding, no tenderness at McBurney's point and negative Murphy's sign.  Musculoskeletal: Normal range of motion.  Lymphadenopathy:    She has no cervical adenopathy.  Neurological: She is alert and oriented to person, place, and time. She has normal reflexes.  Skin: Skin is warm and dry.  Cap refill less than 2 sec  Psychiatric: She has a normal mood and affect. Her behavior is normal.  Nursing note and vitals reviewed.   ED Course  Procedures (including critical care time) DIAGNOSTIC STUDIES: Oxygen Saturation is 100% on RA, normal by my interpretation.    COORDINATION OF CARE: 12:07 AM-Discussed treatment plan which includes abdomen imaging with pt at bedside and pt agreed to plan.     Labs Review Labs Reviewed  URINALYSIS, ROUTINE W REFLEX MICROSCOPIC (NOT AT [facility]) - Abnormal; Notable for the following:    APPearance CLOUDY (*)    All other components within normal limits  PREGNANCY, URINE    Imaging Review No results found.    EKG Interpretation None      MDM   Final diagnoses:  None   Filed Vitals:   05/24/16 2315 05/24/16 2347  BP: 129/90 106/69  Pulse: 98 82  Temp: 98.9 F (37.2 C)   Resp: 18    Results for orders placed or  performed during the hospital encounter of 05/24/16  Pregnancy, urine  Result Value Ref Range   Preg Test, Ur NEGATIVE NEGATIVE  Urinalysis, Routine w reflex microscopic (not at [facility])  Result Value Ref Range   Color, Urine YELLOW YELLOW   APPearance CLOUDY (A) CLEAR   Specific Gravity, Urine 1.023 1.005 - 1.030   pH 6.5 5.0 - 8.0   Glucose, UA NEGATIVE NEGATIVE mg/dL   Hgb urine dipstick NEGATIVE NEGATIVE   Bilirubin Urine NEGATIVE NEGATIVE   Ketones, ur NEGATIVE NEGATIVE mg/dL   Protein, ur NEGATIVE NEGATIVE mg/dL   Nitrite NEGATIVE NEGATIVE   Leukocytes, UA NEGATIVE NEGATIVE  CBC with Differential/Platelet  Result Value Ref Range   WBC 7.6 4.0 - 10.5 K/uL   RBC 4.88 3.87 - 5.11 MIL/uL   Hemoglobin 13.8 12.0 - 15.0 g/dL  HCT 40.7 36.0 - 46.0 %   MCV 83.4 78.0 - 100.0 fL   MCH 28.3 26.0 - 34.0 pg   MCHC 33.9 30.0 - 36.0 g/dL   RDW 69.613.6 29.511.5 - 28.415.5 %   Platelets 269 150 - 400 K/uL   Neutrophils Relative % 39 %   Neutro Abs 2.9 1.7 - 7.7 K/uL   Lymphocytes Relative 50 %   Lymphs Abs 3.9 0.7 - 4.0 K/uL   Monocytes Relative 7 %   Monocytes Absolute 0.5 0.1 - 1.0 K/uL   Eosinophils Relative 3 %   Eosinophils Absolute 0.2 0.0 - 0.7 K/uL   Basophils Relative 1 %   Basophils Absolute 0.0 0.0 - 0.1 K/uL  Comprehensive metabolic panel  Result Value Ref Range   Sodium 139 135 - 145 mmol/L   Potassium 3.5 3.5 - 5.1 mmol/L   Chloride 107 101 - 111 mmol/L   CO2 26 22 - 32 mmol/L   Glucose, Bld 97 65 - 99 mg/dL   BUN 12 6 - 20 mg/dL   Creatinine, Ser 1.320.59 0.44 - 1.00 mg/dL   Calcium 9.4 8.9 - 44.010.3 mg/dL   Total Protein 7.4 6.5 - 8.1 g/dL   Albumin 4.0 3.5 - 5.0 g/dL   AST 40 15 - 41 U/L   ALT 51 14 - 54 U/L   Alkaline Phosphatase 64 38 - 126 U/L   Total Bilirubin 0.4 0.3 - 1.2 mg/dL   GFR calc non Af Amer >60 >60 mL/min   GFR calc Af Amer >60 >60 mL/min   Anion gap 6 5 - 15  Lipase, blood  Result Value Ref Range   Lipase 37 11 - 51 U/L   Ct Renal Stone  Study  05/25/2016  CLINICAL DATA:  Left mid abdominal pain for 10 days. EXAM: CT ABDOMEN AND PELVIS WITHOUT CONTRAST TECHNIQUE: Multidetector CT imaging of the abdomen and pelvis was performed following the standard protocol without IV contrast. COMPARISON:  None. FINDINGS: Lung bases are clear. Kidneys are symmetrical in size and shape. No hydronephrosis or hydroureter. Tiny punctate size stones are demonstrated centrally in the left kidney. No right renal stones identified. Unenhanced appearance of the liver, spleen, gallbladder, pancreas, adrenal glands, abdominal aorta, inferior vena cava, and retroperitoneal lymph nodes is unremarkable. Stomach, small bowel, and colon are not abnormally distended. No free air or free fluid in the abdomen. Scattered mesenteric lymph nodes without pathologic enlargement, likely reactive. Pelvis: Appendix is normal. Uterus and ovaries are not enlarged. No free or loculated pelvic fluid collections. No pelvic mass or lymphadenopathy. No destructive bone lesions. IMPRESSION: Tiny nonobstructing intrarenal stones in the left kidney. No ureteral stone or obstruction demonstrated. Electronically Signed   By: Burman NievesWilliam  [name] M.D.   On: 05/25/2016 00:32    Medications  sodium chloride 0.9 % bolus 500 mL (0 mLs Intravenous Stopped 05/25/16 0110)  ketorolac (TORADOL) 30 MG/ML injection 30 mg (30 mg Intravenous Given 05/25/16 0027)  gi cocktail (Maalox,Lidocaine,Donnatal) (30 mLs Oral Given 05/25/16 0125)    Patient is concerned that we were not able to tell her the exact cause of her pain today in the ED.  EDP apologized multiple times and explained that we are not saying that she does not have pain, and we know that finding the cause of her pain is important and that it is frustrating that we have not given her a complete answer, which we would like to do for her and all our patients. I explained that in  the ED we exclude life threatening causes of pain and symptoms. It is very  reassuring that her lab work and imaging are normal on today's evaluation, but again EDP apologized for the patient's frustration as to the exact source of her pain.   EDP explained specialty tests and examinations may be required to give her an exact answer as to the source of her pain. I have encouraged the patient to follow up with Dr. Pamala Hurry and Gastroenterology as they will be able to performs the additional tests and procedures necessary to give the patient a complete answer as to the source of her pain.  Patient states she had a recent negative H. Pylori test for this issue.   My plan today is to control the patient's symptoms so she can follow up with her doctor and a specialist to achieve her goal of getting a complete answer, we have done this to the patient's satisfaction.  I suspect this is an early gastric ulcer vs. GERD but Nexium daily has not been successful in achieving optimal results.  I have provided the patient with a GERD friendly diet and will increase nexium to BID x 30 days.  Will add carafate in order to coat the stomach in order to give the stomach time to heal.  Patient was initially reluctant to try this medication because she states people have told her it tastes bad.  I have provided a prescription for it and after trying the GI cocktail she is amenable to this plan.  Have provided the name of the on call GI specialist and have advised calling to schedule an appointment. All questions answered to patient's satisfaction.  Based on history and exam patient has been appropriately medically screened and emergency conditions excluded. Patient is stable for discharge at this time. Follow up provided and strict return precautions given.   ~20 minutes spent discussing management and follow up with patient    I personally performed the services described in this documentation, which was scribed in my presence. The recorded information has been reviewed and is accurate.        Jacqueline Blamer, MD 05/25/16 [phone]

## 2016-05-24 NOTE — ED Notes (Signed)
Patient states that she is having pain to her left mid abdominal region. She reports that she went to her PMD and was evaluated and has heard nothing. The patient reports that she feels like she needs to have it further evaluated

## 2016-05-25 ENCOUNTER — Emergency Department (HOSPITAL_BASED_OUTPATIENT_CLINIC_OR_DEPARTMENT_OTHER): Payer: 59

## 2016-05-25 ENCOUNTER — Encounter (HOSPITAL_BASED_OUTPATIENT_CLINIC_OR_DEPARTMENT_OTHER): Payer: Self-pay | Admitting: Emergency Medicine

## 2016-05-25 DIAGNOSIS — R1012 Left upper quadrant pain: Secondary | ICD-10-CM | POA: Diagnosis not present

## 2016-05-25 DIAGNOSIS — N200 Calculus of kidney: Secondary | ICD-10-CM | POA: Diagnosis not present

## 2016-05-25 LAB — COMPREHENSIVE METABOLIC PANEL
ALT: 51 U/L (ref 14–54)
AST: 40 U/L (ref 15–41)
Albumin: 4 g/dL (ref 3.5–5.0)
Alkaline Phosphatase: 64 U/L (ref 38–126)
Anion gap: 6 (ref 5–15)
BUN: 12 mg/dL (ref 6–20)
CO2: 26 mmol/L (ref 22–32)
Calcium: 9.4 mg/dL (ref 8.9–10.3)
Chloride: 107 mmol/L (ref 101–111)
Creatinine, Ser: 0.59 mg/dL (ref 0.44–1.00)
GFR calc Af Amer: >60 mL/min (ref >60)
GFR calc non Af Amer: >60 mL/min (ref >60)
Glucose, Bld: 97 mg/dL (ref 65–99)
Potassium: 3.5 mmol/L (ref 3.5–5.1)
Sodium: 139 mmol/L (ref 135–145)
Total Bilirubin: 0.4 mg/dL (ref 0.3–1.2)
Total Protein: 7.4 g/dL (ref 6.5–8.1)

## 2016-05-25 LAB — LIPASE, BLOOD: Lipase: 37 U/L (ref 11–51)

## 2016-05-25 LAB — CBC WITH DIFFERENTIAL/PLATELET
Basophils Absolute: 0 10*3/uL (ref 0.0–0.1)
Basophils Relative: 1 %
Eosinophils Absolute: 0.2 10*3/uL (ref 0.0–0.7)
Eosinophils Relative: 3 %
HCT: 40.7 % (ref 36.0–46.0)
Hemoglobin: 13.8 g/dL (ref 12.0–15.0)
Lymphocytes Relative: 50 %
Lymphs Abs: 3.9 10*3/uL (ref 0.7–4.0)
MCH: 28.3 pg (ref 26.0–34.0)
MCHC: 33.9 g/dL (ref 30.0–36.0)
MCV: 83.4 fL (ref 78.0–100.0)
Monocytes Absolute: 0.5 10*3/uL (ref 0.1–1.0)
Monocytes Relative: 7 %
Neutro Abs: 2.9 10*3/uL (ref 1.7–7.7)
Neutrophils Relative %: 39 %
Platelets: 269 10*3/uL (ref 150–400)
RBC: 4.88 MIL/uL (ref 3.87–5.11)
RDW: 13.6 % (ref 11.5–15.5)
WBC: 7.6 10*3/uL (ref 4.0–10.5)

## 2016-05-25 MED ORDER — SUCRALFATE 1 GM/10ML PO SUSP: 1.0000 g | Freq: Three times a day (TID) | ORAL | Status: DC

## 2016-05-25 MED ORDER — KETOROLAC TROMETHAMINE 30 MG/ML IJ SOLN
30.0000 mg | Freq: Once | INTRAMUSCULAR | Status: AC
  Administered 2016-05-25: 30 mg via INTRAVENOUS
  Filled 2016-05-25: qty 1

## 2016-05-25 MED ORDER — ESOMEPRAZOLE MAGNESIUM 40 MG PO CPDR: 40.0000 mg | DELAYED_RELEASE_CAPSULE | Freq: Two times a day (BID) | ORAL | Status: DC

## 2016-05-25 MED ORDER — GI COCKTAIL ~~LOC~~
30.0000 mL | Freq: Once | ORAL | Status: AC
  Administered 2016-05-25: 30 mL via ORAL
  Filled 2016-05-25: qty 30

## 2016-05-25 MED ORDER — SODIUM CHLORIDE 0.9 % IV BOLUS (SEPSIS)
500.0000 mL | Freq: Once | INTRAVENOUS | Status: AC
  Administered 2016-05-25: 500 mL via INTRAVENOUS

## 2016-05-25 NOTE — Discharge Instructions (Signed)
[employer] yogurt smoothies

## 2016-05-26 MED FILL — ESOMEPRAZOLE MAG DR 40 MG C: 40 | 30 days supply | Qty: 60 | Fill #0

## 2016-05-26 MED FILL — CARAFATE 1 GM/10 ML SUSP: 1 | 10 days supply | Qty: 420 | Fill #0

## 2016-07-11 ENCOUNTER — Other Ambulatory Visit: Payer: Self-pay | Admitting: Obstetrics & Gynecology

## 2016-07-11 DIAGNOSIS — Z124 Encounter for screening for malignant neoplasm of cervix: Secondary | ICD-10-CM | POA: Diagnosis not present

## 2016-07-11 DIAGNOSIS — Z01419 Encounter for gynecological examination (general) (routine) without abnormal findings: Secondary | ICD-10-CM | POA: Diagnosis not present

## 2016-07-11 DIAGNOSIS — N898 Other specified noninflammatory disorders of vagina: Secondary | ICD-10-CM | POA: Diagnosis not present

## 2016-07-11 DIAGNOSIS — Z3202 Encounter for pregnancy test, result negative: Secondary | ICD-10-CM | POA: Diagnosis not present

## 2016-07-11 DIAGNOSIS — Z6841 Body Mass Index (BMI) 40.0 and over, adult: Secondary | ICD-10-CM | POA: Diagnosis not present

## 2016-07-15 LAB — CYTOLOGY - PAP

## 2016-07-28 DIAGNOSIS — Z0000 Encounter for general adult medical examination without abnormal findings: Secondary | ICD-10-CM | POA: Diagnosis not present

## 2016-07-28 DIAGNOSIS — R740 Nonspecific elevation of levels of transaminase and lactic acid dehydrogenase [LDH]: Secondary | ICD-10-CM | POA: Diagnosis not present

## 2016-08-09 IMAGING — CT NM PET TUM IMG INITIAL (PI) SKULL BASE T - THIGH
8 series · 25 of 25 positions shown · non-contrast
Comparison: CT neck dated 11/15/2014

CLINICAL DATA: Initial treatment strategy for possible lymphoma.

EXAM:
NUCLEAR MEDICINE PET SKULL BASE TO THIGH
TECHNIQUE: 13.4 mCi F-18 FDG was injected intravenously. Full-ring PET imaging
was performed from the skull base to thigh after the radiotracer. CT
data was obtained and used for attenuation correction and anatomic
localization.
FASTING BLOOD GLUCOSE:  Value: 86 mg/dl

[Series 3: pet sk_thigh ac · axial · 5.0mm · 4.07mm/px · z∈[-917,+19]mm · 5 of 235 slices shown]
[im 1/235]
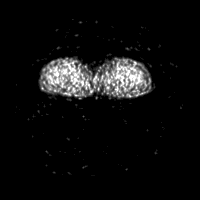
[im 59/235]
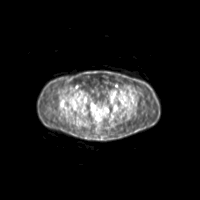
[im 118/235]
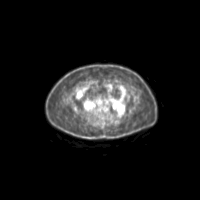
[im 176/235]
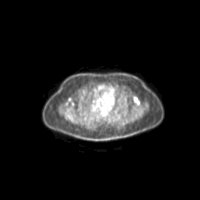
[im 235/235]
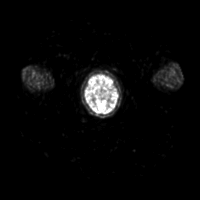

[Series 4: ct sk_thigh 5.0 hd_fov · axial · 5.0mm · 1.17mm/px · z∈[-917,+19]mm · 5 of 235 slices shown]
[im 1/235]
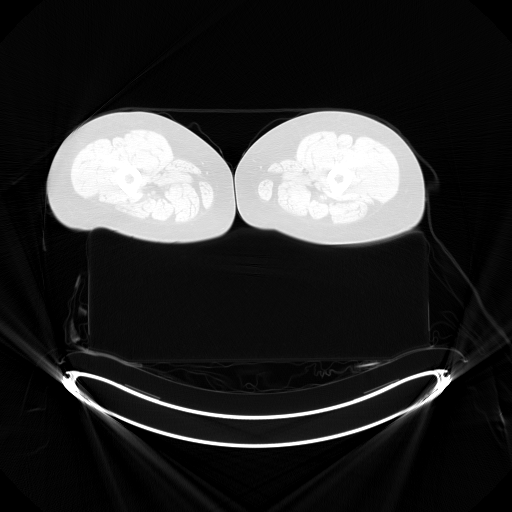
[im 59/235]
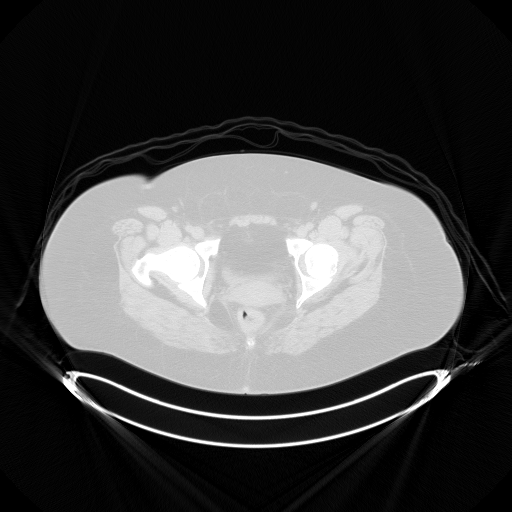
[im 118/235]
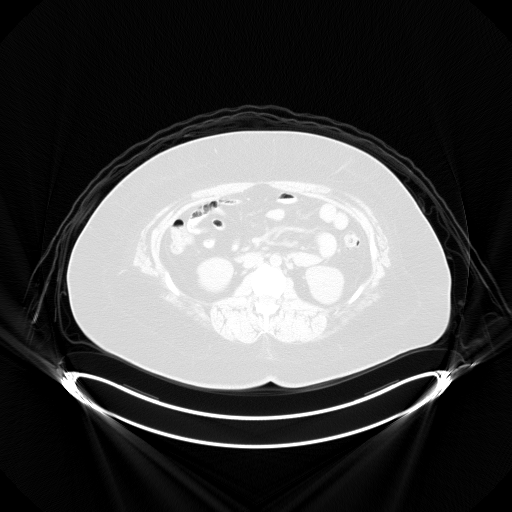
[im 176/235]
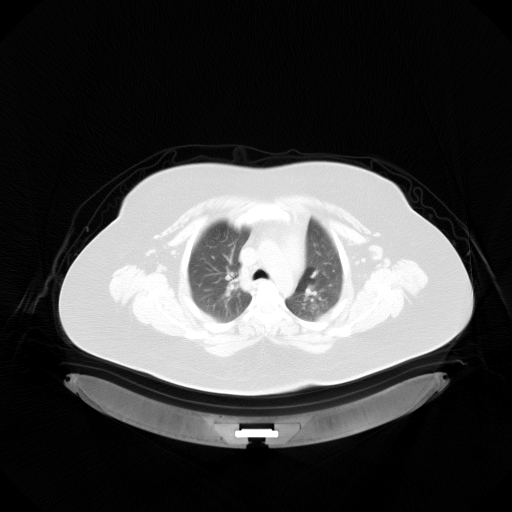
[im 235/235  brain]
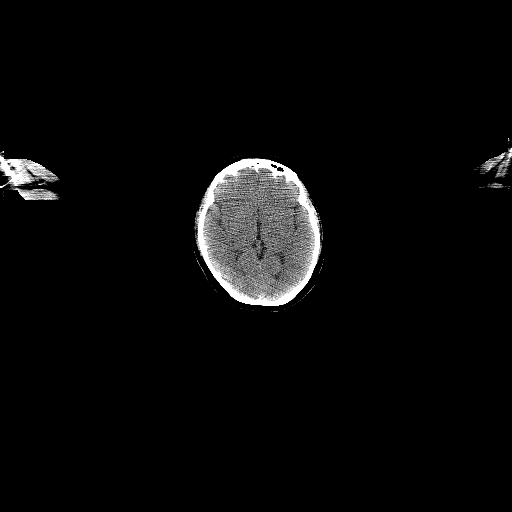

[Series 6: ct sk_thigh 5.0 b70f lung_(id) · axial · 5.0mm · 0.60mm/px · 1 of 61 slices shown]
[im 1/61  bone]
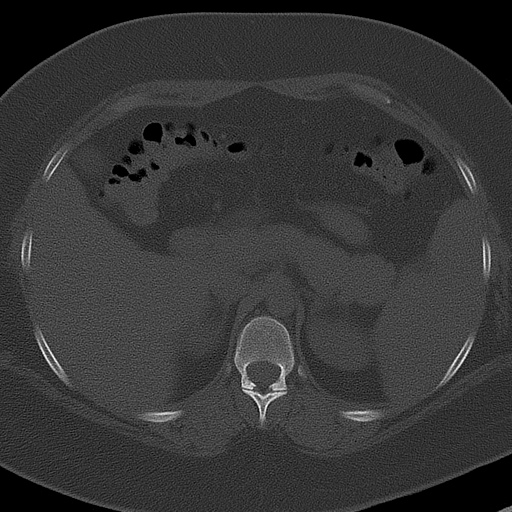

[Series 8: pet sk_thigh nac · axial · 5.0mm · 4.07mm/px · z∈[-917,+19]mm · 5 of 235 slices shown]
[im 1/235]
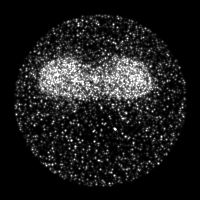
[im 59/235]
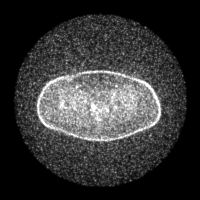
[im 118/235]
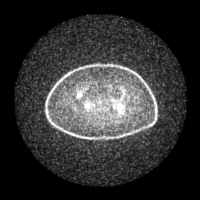
[im 176/235]
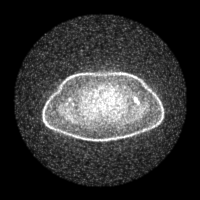
[im 235/235]
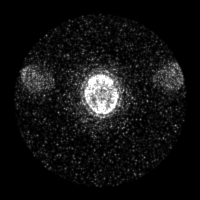

[Series 604: mip collection<mip range> · coronal · 1.94mm/px · 1 of 32 slices shown]
[im 1/32]
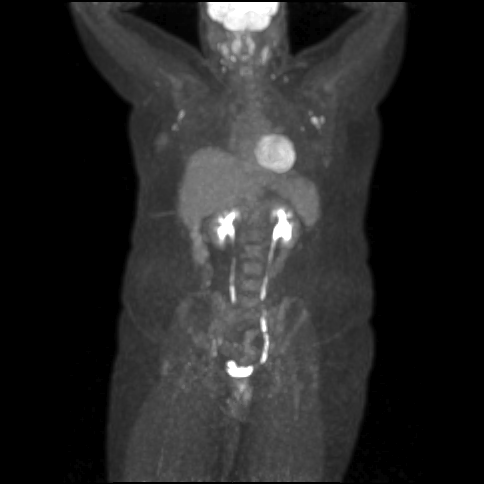

[Series 605: range-ct sk_thigh 5.0 hd_fov-cor-<alpha range> · 2 of 105 slices shown]
[im 1/105]
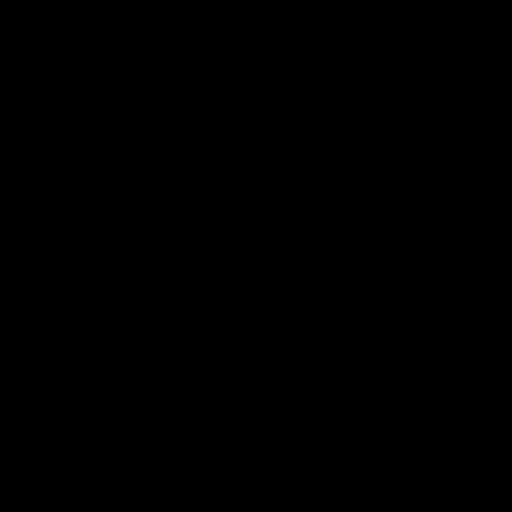
[im 105/105]
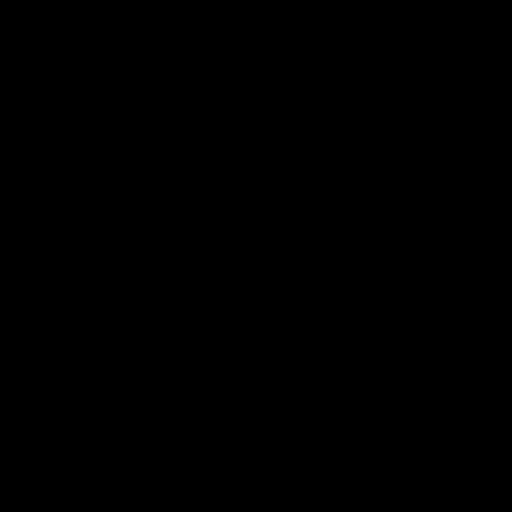

[Series 606: range-ct sk_thigh 5.0 hd_fov-tra-<alpha range> · 5 of 228 slices shown]
[im 1/228]
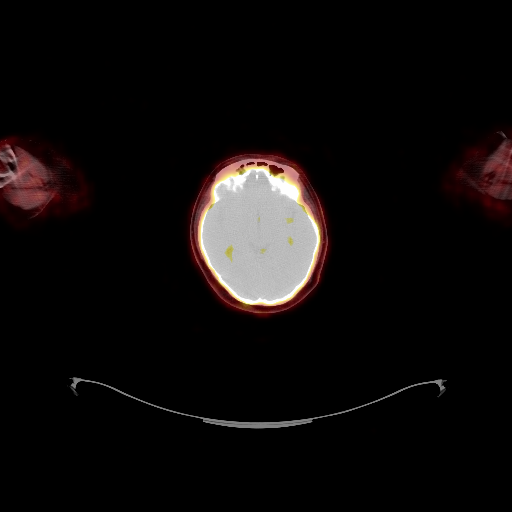
[im 57/228]
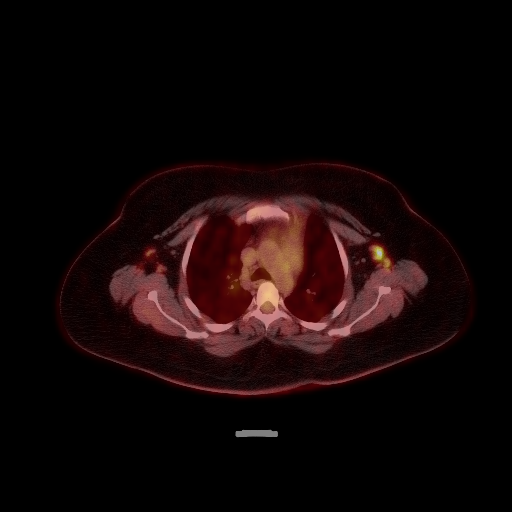
[im 114/228]
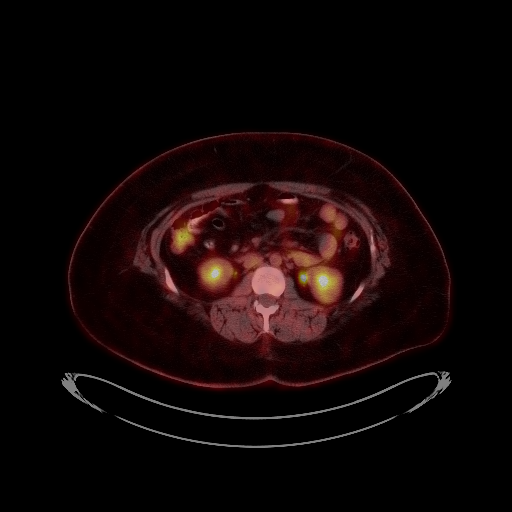
[im 171/228]
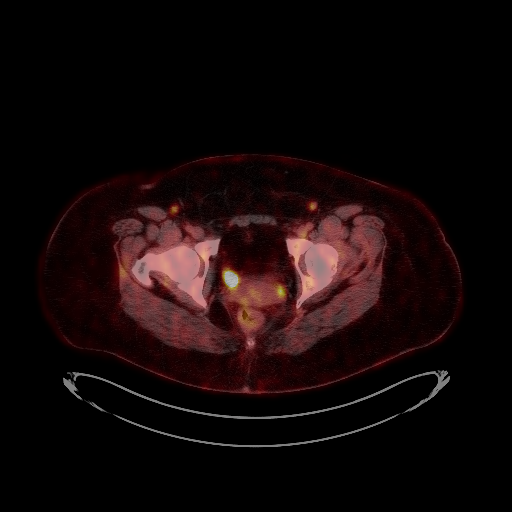
[im 228/228]
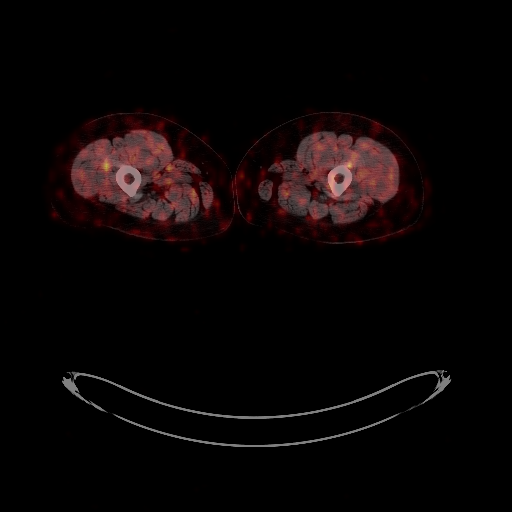

[Series 1040: results mm oncology reading · 1.07mm/px · 1 of 14 slices shown]
[im 1/14]
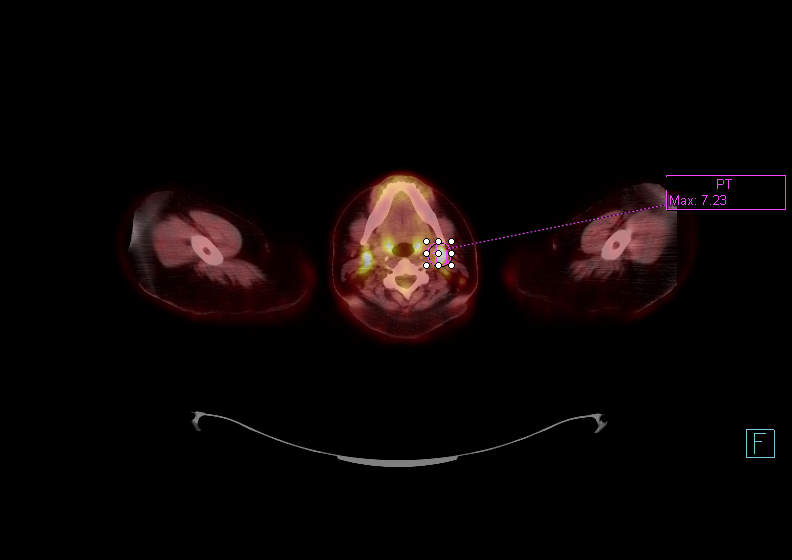

[25 of 25 positions shown; findings below may reference images not displayed]

FINDINGS: NECK

Symmetric hypermetabolism in the bilateral tonsils, max SUV 8.5,
without underlying mass on recent CT neck.

Bilateral hypermetabolic cervical nodes, including:

--13 mm short axis left level II node (series 4/image 26), max SUV
7.2, previously 19 mm

--10 mm short axis right level II node (series 4/ image 26), max SUV
6.7, previously 12 mm

--7 mm short axis left supraclavicular node (series 4/image 38), max
SUV 3.4, previously 9 mm

CHEST

Bilateral hypermetabolic axillary nodes, including:

--10 mm short axis right axillary node (series 4/ image 57), max SUV
5.9

--13 mm short axis left axillary node (series 4/image 59), max SUV
9.0

--8 mm short axis left subpectoral node (series 4/image 63), max SUV
2.8

No hypermetabolic mediastinal or hilar nodes.

No suspicious pulmonary nodules on the CT scan.

ABDOMEN/PELVIS

No abnormal hypermetabolic activity within the liver, pancreas,
adrenal glands, or spleen.

Bilateral hypermetabolic iliac chain and inguinal nodes, including:

--9 mm short axis left deep inguinal node (series 4/ image 135), max
SUV

--6 mm short axis right external iliac node (series 4/ image 169),
max SUV

--5 mm short axis left external iliac node (series 4/ image 171),
max SUV

--6 mm short axis right deep inguinal node (series 4/image 173), max
SUV

--5 mm short axis left inguinal node (series 4/image 177), max SUV
3.0

--6 mm short axis right inguinal node (series 4/ image 178), max SUV
3.2

SKELETON

No focal hypermetabolic activity to suggest skeletal metastasis.
IMPRESSION: Bilateral hypermetabolic cervical, axillary, and iliac/inguinal
nodes.

While this is still considered suspicious for lymphoma until proven
otherwise, the interval slight decrease in size of the cervical
lymphadenopathy at least raises the possibility of a reactive
lymphadenopathy from an underlying systemic/infectious/inflammatory
disorder.

Symmetric hypermetabolism of the bilateral tonsils, likely reactive.

These results were called by telephone at the time of interpretation
on 12/11/2014 at [DATE] to Dr. Beckmann, who verbally acknowledged
these results.

## 2016-08-21 MED FILL — LEVONOR-ETH ESTRAD 0.1-0.02: 0.1-20 | 84 days supply | Qty: 84 | Fill #0

## 2016-09-26 ENCOUNTER — Telehealth: Payer: 59 | Admitting: Nurse Practitioner

## 2016-09-26 DIAGNOSIS — R05 Cough: Secondary | ICD-10-CM

## 2016-09-26 DIAGNOSIS — R059 Cough, unspecified: Secondary | ICD-10-CM

## 2016-09-26 MED ORDER — BENZONATATE 100 MG PO CAPS: 100.0000 mg | ORAL_CAPSULE | Freq: Three times a day (TID) | ORAL | 0 refills | Status: DC | PRN

## 2016-09-26 MED ORDER — AZITHROMYCIN 250 MG PO TABS
ORAL_TABLET | ORAL | 0 refills | Status: DC
Start: 2016-09-26 — End: 2017-02-03

## 2016-09-26 NOTE — Progress Notes (Signed)

## 2016-10-21 MED FILL — LEVONOR-ETH ESTRAD 0.1-0.02: 0.1-20 | 84 days supply | Qty: 84 | Fill #0

## 2016-10-31 MED FILL — ESOMEPRAZOLE MAG DR 40 MG C: 40 | 90 days supply | Qty: 90 | Fill #0

## 2017-02-03 ENCOUNTER — Encounter: Payer: Self-pay | Admitting: *Deleted

## 2017-02-03 ENCOUNTER — Telehealth: Payer: Self-pay | Admitting: *Deleted

## 2017-02-03 DIAGNOSIS — E559 Vitamin D deficiency, unspecified: Secondary | ICD-10-CM

## 2017-02-03 DIAGNOSIS — E282 Polycystic ovarian syndrome: Secondary | ICD-10-CM | POA: Insufficient documentation

## 2017-02-03 HISTORY — DX: Morbid (severe) obesity due to excess calories: E66.01

## 2017-02-03 HISTORY — DX: Vitamin D deficiency, unspecified: E55.9

## 2017-02-03 NOTE — Telephone Encounter (Signed)
PreVisit Call completed. Pt would like to speak to Dr Earlene PlaterWallace regarding her birth control. She states that she stopped taking her birth control about 6 weeks ago. Pt has history of PCOS. Pt states she has not had a menstrual cycle yet. Pt has taken pregnancy test with negative result. Pt wondering if Metformin would be appropriate for her to take to help with PCOS. Pt sees Dr Mor[facility].   Pt also would like to discuss her migraines. States she gets a migraine at least once a week and they last for several days. She experiences the blurry lines, nausea and photosensitivity.   Pt also complaints of LUQ pain x 1 week. Worsened towards the end of last week, she then stopped eating fatty/greasy foods and she states it has gotten a little better. Pt states she has been bloated and nauseous with no emesis. Normal urination.

## 2017-02-03 NOTE — Telephone Encounter (Signed)
PreVisit Call attempted. Unable to leave VM d/t mailbox being full.

## 2017-02-03 NOTE — Addendum Note (Signed)
Addended by: Neomia DearALBRIGHT, CASSANDRA J on: 02/03/2017 02:02 PM   Modules accepted: Orders

## 2017-02-04 ENCOUNTER — Encounter: Payer: Self-pay | Admitting: Family Medicine

## 2017-02-04 ENCOUNTER — Ambulatory Visit (INDEPENDENT_AMBULATORY_CARE_PROVIDER_SITE_OTHER): Payer: 59 | Admitting: Family Medicine

## 2017-02-04 VITALS — BP 124/76 | HR 59 | Temp 98.0°F | Ht 64.0 in | Wt 259.6 lb

## 2017-02-04 DIAGNOSIS — R1012 Left upper quadrant pain: Secondary | ICD-10-CM | POA: Diagnosis not present

## 2017-02-04 DIAGNOSIS — R7303 Prediabetes: Secondary | ICD-10-CM

## 2017-02-04 DIAGNOSIS — E282 Polycystic ovarian syndrome: Secondary | ICD-10-CM

## 2017-02-04 DIAGNOSIS — Z3169 Encounter for other general counseling and advice on procreation: Secondary | ICD-10-CM

## 2017-02-04 DIAGNOSIS — G43009 Migraine without aura, not intractable, without status migrainosus: Secondary | ICD-10-CM | POA: Diagnosis not present

## 2017-02-04 LAB — CBC
HCT: 41.8 % (ref 36.0–46.0)
Hemoglobin: 13.9 g/dL (ref 12.0–15.0)
MCHC: 33.3 g/dL (ref 30.0–36.0)
MCV: 84 fl (ref 78.0–100.0)
Platelets: 295 10*3/uL (ref 150.0–400.0)
RBC: 4.98 Mil/uL (ref 3.87–5.11)
RDW: 14 % (ref 11.5–15.5)
WBC: 6.4 10*3/uL (ref 4.0–10.5)

## 2017-02-04 LAB — HEMOGLOBIN A1C: Hgb A1c MFr Bld: 5.8 % (ref 4.6–6.5)

## 2017-02-04 LAB — COMPREHENSIVE METABOLIC PANEL
ALT: 32 U/L (ref 0–35)
AST: 25 U/L (ref 0–37)
Albumin: 4.4 g/dL (ref 3.5–5.2)
Alkaline Phosphatase: 85 U/L (ref 39–117)
BUN: 8 mg/dL (ref 6–23)
CO2: 27 mEq/L (ref 19–32)
Calcium: 9.8 mg/dL (ref 8.4–10.5)
Chloride: 103 mEq/L (ref 96–112)
Creatinine, Ser: 0.63 mg/dL (ref 0.40–1.20)
GFR: 120.18 mL/min (ref >60.00)
Glucose, Bld: 106 mg/dL — ABNORMAL HIGH (ref 70–99)
Potassium: 4.3 mEq/L (ref 3.5–5.1)
Sodium: 139 mEq/L (ref 135–145)
Total Bilirubin: 0.4 mg/dL (ref 0.2–1.2)
Total Protein: 7.8 g/dL (ref 6.0–8.3)

## 2017-02-04 LAB — TSH: TSH: 2.16 u[IU]/mL (ref 0.35–4.50)

## 2017-02-04 MED ORDER — THRIVITE 19 29-1 MG PO TABS: 1.0000 | ORAL_TABLET | Freq: Every day | ORAL | 6 refills | Status: DC

## 2017-02-04 MED ORDER — METFORMIN HCL 500 MG PO TABS: 500.0000 mg | ORAL_TABLET | Freq: Two times a day (BID) | ORAL | 3 refills | Status: DC

## 2017-02-04 MED ORDER — PHENTERMINE HCL 37.5 MG PO TABS: 37.5000 mg | ORAL_TABLET | Freq: Every day | ORAL | 0 refills | Status: DC

## 2017-02-04 MED FILL — PHENTERMINE 37.5 MG TABLET: 37.5 | 30 days supply | Qty: 30 | Fill #0

## 2017-02-04 MED FILL — metFORMIN HCL 500 MG TABS: 500 | 90 days supply | Qty: 180 | Fill #0

## 2017-02-04 NOTE — Progress Notes (Signed)
Jacqueline [name] is a 26 y.o. female is here to [employer].   History of Present Illness:  Insurance claims handler, CMA, acting as scribe for Dr. Earlene Plater.  CC: Patient comes in today to establish care.  States she has history of PCOS and stopped taking her birth control.  Since then, she has not had a period.  LMP 12/24/2016.  Would like to discuss birth control options.  Also reports she has a long history of severe headaches.  Worried that she takes too much Tylenol and Excedrin and would like to try prescription medication for headaches.  States she also has LUQ abdominal pain after eating as well.  This seems to worsen when eating greasy/fatty foods.  Headache   This is a chronic problem. The current episode started more than 1 year ago. The problem occurs intermittently. The problem has been unchanged. The pain quality is similar to prior headaches. The quality of the pain is described as squeezing and throbbing. The pain is severe. Associated symptoms include abdominal pain, nausea, photophobia and a visual change. Pertinent negatives include no back pain, blurred vision, coughing, dizziness, ear pain, fever, neck pain, seizures, sinus pressure, tinnitus or vomiting. The symptoms are aggravated by bright light. She has tried Excedrin and acetaminophen for the symptoms. The treatment provided moderate relief.  Abdominal Pain  This is a recurrent problem. The current episode started 1 to 4 weeks ago. The problem occurs intermittently. The problem has been waxing and waning. The pain is located in the LUQ. The pain is mild. The quality of the pain is dull and colicky. The abdominal pain does not radiate. Associated symptoms include belching, flatus, headaches and nausea. Pertinent negatives include no fever, frequency or vomiting. The pain is aggravated by certain positions. The pain is relieved by bowel movements and belching. She has tried proton pump inhibitors for the symptoms. Her past medical history  is significant for GERD.   There are no preventive care reminders to display for this patient.  PMHx, SurgHx, SocialHx, Medications, and Allergies were reviewed in the Visit Navigator and updated as appropriate.   Past Medical History:  Diagnosis Date  . Adenopathy, Left Cervical   . GERD (gastroesophageal reflux disease)   . History of Epstein-Barr virus infection   . Migraines   . Morbid (severe) obesity due to excess calories (HCC) 02/03/2017  . PCOS (polycystic ovarian syndrome)   . Pericarditis 11/11/2015  . Vitamin D deficiency 02/03/2017   Past Surgical History:  Procedure Laterality Date  . LYMPH NODE BIOPSY Left 12/11/2014  . WISDOM TOOTH EXTRACTION     Family History  Problem Relation Age of Onset  . Diabetes Mother   . Hypertension Mother   . Cancer Maternal Grandmother     Hodgkin's Lymphoma  . Diabetes Paternal Grandmother   . Heart disease Paternal Grandmother   . Heart disease Paternal Grandfather    Social History  Substance Use Topics  . Smoking status: Never Smoker  . Smokeless tobacco: Never Used  . Alcohol use 0.0 oz/week     Comment: social   Current Medications and Allergies:   .  acetaminophen (TYLENOL) 325 MG tablet, Take 650 mg by mouth every 6 (six) hours as needed., Disp: , Rfl:  .  aspirin-acetaminophen-caffeine (EXCEDRIN MIGRAINE) 250-250-65 MG tablet, Take by mouth every 6 (six) hours as needed for headache., Disp: , Rfl:  .  esomeprazole (NEXIUM) 40 MG capsule, Take 1 capsule (40 mg total) by mouth 2 (two) times daily  before a meal. (Patient taking differently: Take 40 mg by mouth as needed. ), Disp: 60 capsule, Rfl: 0 .  ondansetron (ZOFRAN) 4 MG tablet, Take 4 mg by mouth as needed., Disp: , Rfl:  .  Probiotic Product (PROBIOTIC DAILY PO), Take by mouth., Disp: , Rfl:   Allergies  Allergen Reactions  . Colchicine Diarrhea and Oral Ulcers  . Indomethacin Blood in Stool   Review of Systems:   Review of Systems  Constitutional: Negative  for chills and fever.  HENT: Negative for congestion, ear pain, sinus pressure and tinnitus.   Eyes: Positive for photophobia. Negative for blurred vision.  Respiratory: Negative for cough.   Cardiovascular: Negative for chest pain and palpitations.  Gastrointestinal: Positive for abdominal pain, flatus and nausea. Negative for vomiting.  Genitourinary: Negative for frequency.  Musculoskeletal: Negative for back pain and neck pain.  Skin: Negative for rash.  Neurological: Positive for headaches. Negative for dizziness, seizures and loss of consciousness.  Psychiatric/Behavioral: Negative for depression. The patient is not nervous/anxious.     Vitals:   Vitals:   02/04/17 0933  BP: 124/76  Pulse: (!) 59  Temp: 98 F (36.7 C)  TempSrc: Oral  SpO2: 98%  Weight: 259 lb 9.6 oz (117.8 kg)  Height: 5\' 4"  (1.626 m)     Body mass index is 44.56 kg/m.   Physical Exam:   Physical Exam  Constitutional: She is oriented to person, place, and time. She appears well-developed and well-nourished.  HENT:  Head: Normocephalic and atraumatic.  Right Ear: External ear normal.  Left Ear: External ear normal.  Nose: Nose normal.  Mouth/Throat: Oropharynx is clear and moist.  Eyes: Conjunctivae and EOM are normal. Pupils are equal, round, and reactive to light.  Neck: Normal range of motion. Neck supple. No thyromegaly present.  Cardiovascular: Normal rate, regular rhythm, normal heart sounds and intact distal pulses.   No murmur heard. Pulmonary/Chest: Effort normal and breath sounds normal.  Abdominal: Soft. Bowel sounds are normal. She exhibits no distension and no mass. There is no tenderness. There is no rebound and no guarding.  Lymphadenopathy:    She has no cervical adenopathy.  Neurological: She is alert and oriented to person, place, and time. She displays normal reflexes. No cranial nerve deficit or sensory deficit. She exhibits normal muscle tone. Coordination normal.  Skin: Skin  is warm.  Psychiatric: She has a normal mood and affect. Her behavior is normal.  Nursing note and vitals reviewed.  Results for orders placed or performed in visit on 02/04/17  CBC  Result Value Ref Range   WBC 6.4 4.0 - 10.5 K/uL   RBC 4.98 3.87 - 5.11 Mil/uL   Platelets 295.0 150.0 - 400.0 K/uL   Hemoglobin 13.9 12.0 - 15.0 g/dL   HCT 16.1 09.6 - 04.5 %   MCV 84.0 78.0 - 100.0 fl   MCHC 33.3 30.0 - 36.0 g/dL   RDW 40.9 81.1 - 91.4 %  Comprehensive metabolic panel  Result Value Ref Range   Sodium 139 135 - 145 mEq/L   Potassium 4.3 3.5 - 5.1 mEq/L   Chloride 103 96 - 112 mEq/L   CO2 27 19 - 32 mEq/L   Glucose, Bld 106 (H) 70 - 99 mg/dL   BUN 8 6 - 23 mg/dL   Creatinine, Ser 7.82 0.40 - 1.20 mg/dL   Total Bilirubin 0.4 0.2 - 1.2 mg/dL   Alkaline Phosphatase 85 39 - 117 U/L   AST 25 0 - 37  U/L   ALT 32 0 - 35 U/L   Total Protein 7.8 6.0 - 8.3 g/dL   Albumin 4.4 3.5 - 5.2 g/dL   Calcium 9.8 8.4 - 16.110.5 mg/dL   GFR 096.04120.18 >54.09>60.00 mL/min  TSH  Result Value Ref Range   TSH 2.16 0.35 - 4.50 uIU/mL  Hemoglobin A1c  Result Value Ref Range   Hgb A1c MFr Bld 5.8 4.6 - 6.5 %    Assessment and Plan:    Joni Reiningicole was seen today for establish care, contraception, pcos and headache.  Diagnoses and all orders for this visit:  Polycystic ovarian syndrome Comments: Reviewed syndrome, with mulitple preconception considerations. See Preconception Encounter below. Orders: -     TSH -     Hemoglobin A1c -     Insulin, Free (Bioactive) -     metFORMIN (GLUCOPHAGE) 500 MG tablet; Take 1 tablet (500 mg total) by mouth 2 (two) times daily with a meal. -     levonorgestrel-ethinyl estradiol (AVIANE,ALESSE,LESSINA) 0.1-20 MG-MCG tablet; Take 1 tablet by mouth daily.  Migraine without aura and without status migrainosus, not intractable Comments: Seems to be related to menses. May be improved with OCPs. She would like to hold on medication. Gave handout.  LUQ pain Comments: None at the  moment. Likely gas in LUQ. Reviewed bowel regimen. May worsen with introduction of Metformin. Orders: -     CBC -     Comprehensive metabolic panel  Morbid (severe) obesity due to excess calories (HCC) Comments: The patient is asked to make an attempt to improve diet and exercise patterns to aid in medical management of this problem.  Orders: -     phentermine (ADIPEX-P) 37.5 MG tablet; Take 1 tablet (37.5 mg total) by mouth daily before breakfast.  Encounter for preconception consultation Comments: Continue OCP x 6 months while working on weight loss and taking PNV. Patient would like to starting trying to conceive after. Orders: -     Prenatal Vit-DSS-Fe Fum-FA (THRIVITE 19) 29-1 MG TABS; Take 1 tablet by mouth daily.   . Reviewed expectations re: course of current medical issues. . Discussed self-management of symptoms. . Outlined signs and symptoms indicating need for more acute intervention. . Patient verbalized understanding and all questions were answered. . See orders for this visit as documented in the electronic medical record. . Patient received an After Visit Summary.  Records requested if needed. I spent 45 minutes with this patient, greater than 50% was face-to-face time counseling regarding the above diagnoses.  CMA served as Neurosurgeonscribe during this visit. History, Physical, and Plan performed by medical provider. Documentation and orders reviewed and attested to. Helane RimaErica Litsy Epting, D.O.  Helane RimaErica Tyjuan Demetro, D.O. [place], [facility] 02/07/2017   Follow-up: Return in about 4 weeks (around 03/04/2017).  Meds ordered this encounter  Medications  . phentermine (ADIPEX-P) 37.5 MG tablet    Sig: Take 1 tablet (37.5 mg total) by mouth daily before breakfast.    Dispense:  30 tablet    Refill:  0  . metFORMIN (GLUCOPHAGE) 500 MG tablet    Sig: Take 1 tablet (500 mg total) by mouth 2 (two) times daily with a meal.    Dispense:  180 tablet    Refill:  3  . Prenatal Vit-DSS-Fe  Fum-FA (THRIVITE 19) 29-1 MG TABS    Sig: Take 1 tablet by mouth daily.    Dispense:  30 tablet    Refill:  6  . levonorgestrel-ethinyl estradiol (AVIANE,ALESSE,LESSINA) 0.1-20 MG-MCG tablet  Sig: Take 1 tablet by mouth daily.    Dispense:  1 Package    Refill:  5   Medications Discontinued During This Encounter  Medication Reason  . levonorgestrel-ethinyl estradiol (AVIANE,ALESSE,LESSINA) 0.1-20 MG-MCG tablet Error   Orders Placed This Encounter  Procedures  . CBC  . Comprehensive metabolic panel  . TSH  . Hemoglobin A1c  . Insulin, Free (Bioactive)

## 2017-02-04 NOTE — Progress Notes (Signed)
Pre visit review using our clinic review tool, if applicable. No additional management support is needed unless otherwise documented below in the visit note. 

## 2017-02-06 MED FILL — SE-NATAL 19 TABLET: 29-1 | 30 days supply | Qty: 30 | Fill #0

## 2017-02-07 ENCOUNTER — Encounter: Payer: Self-pay | Admitting: Family Medicine

## 2017-02-07 DIAGNOSIS — G43909 Migraine, unspecified, not intractable, without status migrainosus: Secondary | ICD-10-CM | POA: Insufficient documentation

## 2017-02-07 DIAGNOSIS — K219 Gastro-esophageal reflux disease without esophagitis: Secondary | ICD-10-CM | POA: Insufficient documentation

## 2017-02-07 DIAGNOSIS — R7303 Prediabetes: Secondary | ICD-10-CM | POA: Insufficient documentation

## 2017-02-07 MED ORDER — LEVONORGESTREL-ETHINYL ESTRAD 0.1-20 MG-MCG PO TABS: 1.0000 | ORAL_TABLET | Freq: Every day | ORAL | 5 refills | Status: DC

## 2017-02-08 LAB — INSULIN, FREE (BIOACTIVE): Insulin, Free: 18.5 u[IU]/mL — ABNORMAL HIGH (ref 1.5–14.9)

## 2017-02-09 MED FILL — LEVONOR-ETH ESTRAD 0.1-0.02: 0.1-20 | 84 days supply | Qty: 84 | Fill #0

## 2017-02-13 ENCOUNTER — Telehealth: Payer: Self-pay

## 2017-02-13 ENCOUNTER — Encounter: Payer: Self-pay | Admitting: Physician Assistant

## 2017-02-13 ENCOUNTER — Ambulatory Visit (INDEPENDENT_AMBULATORY_CARE_PROVIDER_SITE_OTHER): Payer: 59 | Admitting: Physician Assistant

## 2017-02-13 VITALS — BP 124/70 | HR 86 | Temp 98.0°F | Ht 64.0 in | Wt 256.0 lb

## 2017-02-13 DIAGNOSIS — R0789 Other chest pain: Secondary | ICD-10-CM

## 2017-02-13 DIAGNOSIS — T7840XA Allergy, unspecified, initial encounter: Secondary | ICD-10-CM

## 2017-02-13 DIAGNOSIS — L509 Urticaria, unspecified: Secondary | ICD-10-CM

## 2017-02-13 MED ORDER — METHYLPREDNISOLONE ACETATE 80 MG/ML IJ SUSP
80.0000 mg | Freq: Once | INTRAMUSCULAR | Status: AC
  Administered 2017-02-13: 80 mg via INTRAMUSCULAR

## 2017-02-13 NOTE — Progress Notes (Signed)
Jacqueline [name] is a 26 y.o. female here for allergic reaction and hives with itching.  I acted as a Neurosurgeon for [employer], PA-C Corky Mull, LPN  History of Present Illness:   Chief Complaint  Patient presents with  . Allergic Reaction  . Urticaria  . Pruritis    HPI   During her last PCP visit on 02/04/17, she was prescribed phentermine, metformin and a prenatal MVI. She started the medicine and per her pharmacist's recommendation, she has started on 1/2 a tablet of phentermine (instead of a whole tablet to help with tolerance),  metformin daily and the prenatal. She has been taking these medications for about 1 week now. 3 days ago she noticed she had some "chest discomfort", felt like "burning in her throat". She has been taking Nexium daily since then without relief. Yesterday, she noticed that she developed hives on her arms and stomach, had some intermittent chest tightness and itching. She took 1 Benadryl last night but it made her very sleepy so she did not take anymore. She stopped her MVI for one day but didn't find that it prevented her symptoms. She stopped her Phentermine today and has not taken her metformin yet, as she usually takes it in the evening. She states that her HR was in the low 100's for most of the day yesterday. Her hives are essentially gone this morning. She does have history of pericarditis, last treated < 1 year ago.  Of note, she is down approximately 3 pounds since her last visit. Wt Readings from Last 3 Encounters:  02/13/17 256 lb (116.1 kg)  02/04/17 259 lb 9.6 oz (117.8 kg)  05/24/16 258 lb (117 kg)    PMHx, SurgHx, SocialHx, Medications, and Allergies were reviewed in the Visit Navigator and updated as appropriate.  Current Medications:   Current Outpatient Prescriptions:  .  acetaminophen (TYLENOL) 325 MG tablet, Take 650 mg by mouth every 6 (six) hours as needed., Disp: , Rfl:  .  aspirin-acetaminophen-caffeine (EXCEDRIN MIGRAINE)  250-250-65 MG tablet, Take by mouth every 6 (six) hours as needed for headache., Disp: , Rfl:  .  esomeprazole (NEXIUM) 40 MG capsule, Take 1 capsule (40 mg total) by mouth 2 (two) times daily before a meal. (Patient taking differently: Take 40 mg by mouth as needed. ), Disp: 60 capsule, Rfl: 0 .  levonorgestrel-ethinyl estradiol (AVIANE,ALESSE,LESSINA) 0.1-20 MG-MCG tablet, Take 1 tablet by mouth daily., Disp: 1 Package, Rfl: 5 .  metFORMIN (GLUCOPHAGE) 500 MG tablet, Take 1 tablet (500 mg total) by mouth 2 (two) times daily with a meal., Disp: 180 tablet, Rfl: 3 .  ondansetron (ZOFRAN) 4 MG tablet, Take 4 mg by mouth as needed., Disp: , Rfl:  .  phentermine (ADIPEX-P) 37.5 MG tablet, Take 1 tablet (37.5 mg total) by mouth daily before breakfast., Disp: 30 tablet, Rfl: 0 .  Prenatal Vit-DSS-Fe Fum-FA (THRIVITE 19) 29-1 MG TABS, Take 1 tablet by mouth daily., Disp: 30 tablet, Rfl: 6 .  Probiotic Product (PROBIOTIC DAILY PO), Take by mouth., Disp: , Rfl:   Current Facility-Administered Medications:  .  methylPREDNISolone acetate (DEPO-MEDROL) injection 80 mg, 80 mg, Intramuscular, Once, Jarold Motto, PA   Review of Systems:   Review of Systems  Constitutional: Negative for chills and fever.  Respiratory: Positive for shortness of breath.   Cardiovascular: Positive for chest pain and palpitations.  Gastrointestinal: Positive for heartburn.  Skin: Positive for itching and rash.       Hives  Neurological: Negative.  Vitals:   Vitals:   02/13/17 1107  BP: 124/70  Pulse: 86  Temp: 98 F (36.7 C)  TempSrc: Oral  SpO2: 98%  Weight: 256 lb (116.1 kg)  Height:  (1.626 m)     Body mass index is 43.94 kg/m.  Physical Exam:   Physical Exam  Constitutional: She appears well-developed. She is cooperative.  Non-toxic appearance. She does not have a sickly appearance. She does not appear ill. No distress.  Cardiovascular: Normal rate, regular rhythm, S1 normal, S2 normal, normal  heart sounds and normal pulses.   No LE edema  Pulmonary/Chest: Effort normal and breath sounds normal.  Neurological: She is alert.  Skin:  No rash or urticaria  Nursing note and vitals reviewed.   EKG tracing is personally reviewed.  EKG notes NSR.  No acute changes.   Assessment and Plan:    [name] was seen today for allergic reaction, urticaria and pruritis.  Diagnoses and all orders for this visit:  Allergic reaction, initial encounter  Chest tightness -     EKG 12-Lead  Hives -     methylPREDNISolone acetate (DEPO-MEDROL) injection 80 mg; Inject 1 mL (80 mg total) into the muscle once.   EKG unremarkable, compared to previous EKG. Unable to exactly pinpoint cause of reaction, but I suspect it is likely secondary to the phentermine use. I discussed case with patient's PCP, Dr. Helane Rima. Will discontinue both phentermine and metformin. Continue MVI, Nexium and start daily Claritin, as Benadryl is too sedating for patient. I advised patient as follows: If you develop any worsening shortness of breath, chest discomfort or other worrisome symptoms, please go to the emergency room. She has a scheduled appointment with Dr. Helane Rima in 3 weeks and will discuss with her at that time what her other medication options are.    . Reviewed expectations re: course of current medical issues. . Discussed self-management of symptoms. . Outlined signs and symptoms indicating need for more acute intervention. . Patient verbalized understanding and all questions were answered. . See orders for this visit as documented in the electronic medical record. . Patient received an After-Visit Summary.  CMA or LPN served as scribe during this visit. History, Physical, and Plan performed by medical provider. Documentation and orders reviewed and attested to.  Jarold Motto, PA-C

## 2017-02-13 NOTE — Telephone Encounter (Signed)
Pt spoke to [facility] at 10:23 pm 02/12/17 stating "she has itchy hives, she started three new medications last week. She started a prenatal vitatmin for her PCOS, metformin, and phentermine. Pt has hives down both arms, stomach, and anterior neck. Pt stated for the past few days her chest has been hurting and feels like a golf ball is in her throat. Pt thought it was heartburn, but Nexium has not helped."   Pt was instructed by [facility] to call her PCP this morning and schedule an appointment within 24 hours. Pt stated she has been taking benadryl.    I attempted to call patient to receive more details and to schedule her to see a provider today. Pt did not answer phone; left a VM for patient to call back.

## 2017-02-13 NOTE — Progress Notes (Signed)
Pre visit review using our clinic review tool, if applicable. No additional management support is needed unless otherwise documented below in the visit note. 

## 2017-02-13 NOTE — Telephone Encounter (Signed)
Pt returned call. Pt states she is still itchy under her arms. Pt says her rash has mostly cleared up; only a couple small patches of hives left. Pt only took one Benadry last night, but none today. Chest tightness past couple of days, but chest feels better today. No SOB.   PCP unavailable; scheduled with Jarold Motto at 11:00am today.

## 2017-02-13 NOTE — Telephone Encounter (Signed)
Noted  

## 2017-02-13 NOTE — Patient Instructions (Signed)
It was great seeing you today!  Please stop the phentermine and metformin.  Take Claritin and Nexium daily.  If you develop any worsening shortness of breath, chest discomfort or other worrisome symptoms, please go to the emergency room.  Please see Dr. Earlene Plater in about 2 weeks to discuss the next steps.

## 2017-02-23 DIAGNOSIS — H5213 Myopia, bilateral: Secondary | ICD-10-CM | POA: Diagnosis not present

## 2017-02-23 DIAGNOSIS — H52223 Regular astigmatism, bilateral: Secondary | ICD-10-CM | POA: Diagnosis not present

## 2017-03-09 ENCOUNTER — Ambulatory Visit (INDEPENDENT_AMBULATORY_CARE_PROVIDER_SITE_OTHER): Payer: 59 | Admitting: Family Medicine

## 2017-03-09 ENCOUNTER — Encounter: Payer: Self-pay | Admitting: Family Medicine

## 2017-03-09 VITALS — BP 122/74 | HR 56 | Temp 99.0°F | Ht 64.0 in | Wt 258.8 lb

## 2017-03-09 DIAGNOSIS — E282 Polycystic ovarian syndrome: Secondary | ICD-10-CM

## 2017-03-09 DIAGNOSIS — R7303 Prediabetes: Secondary | ICD-10-CM

## 2017-03-09 NOTE — Progress Notes (Signed)
Jacqueline [name] is a 26 y.o. female is here for follow up.  History of Present Illness:   Insurance claims handler, CMA, acting as scribe for Dr. Earlene Plater.  CC:  Patient with PCOS comes in today for follow up.  States she is doing well.  Had to stop metformin and phentermine due to allergic reaction.  States she is disappointed because she was unable to tolerate metformin.  She was hoping metformin would help her conceive.  No acute complaints today.  HPI: As above. See previous note with Jarold Motto. Drug rash while taking Phentermine/Metformin. Now resolved. Never with airway issues. No new concerns. Hoping to conceived within the year.  There are no preventive care reminders to display for this patient.  PMHx, SurgHx, SocialHx, FamHx, Medications, and Allergies were reviewed in the Visit Navigator and updated as appropriate.   Patient Active Problem List   Diagnosis Date Noted  . Prediabetes 02/07/2017  . Migraines   . GERD (gastroesophageal reflux disease)   . Polycystic ovarian syndrome 02/03/2017  . Vitamin D deficiency 02/03/2017  . Morbid (severe) obesity due to excess calories (HCC) 02/03/2017   Social History  Substance Use Topics  . Smoking status: Never Smoker  . Smokeless tobacco: Never Used  . Alcohol use 0.0 oz/week     Comment: social   Current Medications and Allergies:   .  acetaminophen (TYLENOL) 325 MG tablet, Take 650 mg by mouth every 6 (six) hours as needed., Disp: , Rfl:  .  aspirin-acetaminophen-caffeine (EXCEDRIN MIGRAINE) 250-250-65 MG tablet, Take by mouth every 6 (six) hours as needed for headache., Disp: , Rfl:  .  esomeprazole (NEXIUM) 40 MG capsule, Take 1 capsule (40 mg total) by mouth 2 (two) times daily before a meal. (Patient taking differently: Take 40 mg by mouth as needed. ), Disp: 60 capsule, Rfl: 0 .  levonorgestrel-ethinyl estradiol (AVIANE,ALESSE,LESSINA) 0.1-20 MG-MCG tablet, Take 1 tablet by mouth daily., Disp: 1 Package, Rfl: 5 .   ondansetron (ZOFRAN) 4 MG tablet, Take 4 mg by mouth as needed., Disp: , Rfl:  .  Prenatal Vit-DSS-Fe Fum-FA (THRIVITE 19) 29-1 MG TABS, Take 1 tablet by mouth daily., Disp: 30 tablet, Rfl: 6 .  Probiotic Product (PROBIOTIC DAILY PO), Take by mouth., Disp: , Rfl:   Allergies  Allergen Reactions  . Colchicine Diarrhea and Other (See Comments)    DIARRHEA AND MOUTH SORES  . Indomethacin Other (See Comments)    Blood in stool    Review of Systems   Review of Systems  Constitutional: Negative for chills and fever.  HENT: Negative for congestion and sore throat.   Eyes: Negative for blurred vision.  Respiratory: Negative for cough and shortness of breath.   Cardiovascular: Negative for chest pain and palpitations.  Gastrointestinal: Negative for abdominal pain, nausea and vomiting.  Genitourinary: Negative for frequency.  Musculoskeletal: Negative for back pain and neck pain.  Skin: Negative for rash.  Neurological: Negative for dizziness, loss of consciousness and headaches.  Psychiatric/Behavioral: Negative for depression. The patient is not nervous/anxious.     Vitals:   Vitals:   03/09/17 0959  BP: 122/74  Pulse: (!) 56  Temp: 99 F (37.2 C)  TempSrc: Oral  SpO2: 99%  Weight: 258 lb 12.8 oz (117.4 kg)  Height:  (1.626 m)     Body mass index is 44.42 kg/m.   Physical Exam:    Physical Exam  Constitutional: She appears well-nourished.  HENT:  Head: Normocephalic and atraumatic.  Eyes: EOM  are normal. Pupils are equal, round, and reactive to light.  Neck: Normal range of motion. Neck supple.  Cardiovascular: Normal rate, regular rhythm, normal heart sounds and intact distal pulses.   Pulmonary/Chest: Effort normal.  Abdominal: Soft.  Skin: Skin is warm.  Psychiatric: She has a normal mood and affect. Her behavior is normal.  Nursing note and vitals reviewed.    Results for orders placed or performed in visit on 02/04/17  CBC  Result Value Ref Range    WBC 6.4 4.0 - 10.5 K/uL   RBC 4.98 3.87 - 5.11 Mil/uL   Platelets 295.0 150.0 - 400.0 K/uL   Hemoglobin 13.9 12.0 - 15.0 g/dL   HCT 09.8 11.9 - 14.7 %   MCV 84.0 78.0 - 100.0 fl   MCHC 33.3 30.0 - 36.0 g/dL   RDW 82.9 56.2 - 13.0 %  Comprehensive metabolic panel  Result Value Ref Range   Sodium 139 135 - 145 mEq/L   Potassium 4.3 3.5 - 5.1 mEq/L   Chloride 103 96 - 112 mEq/L   CO2 27 19 - 32 mEq/L   Glucose, Bld 106 (H) 70 - 99 mg/dL   BUN 8 6 - 23 mg/dL   Creatinine, Ser 8.65 0.40 - 1.20 mg/dL   Total Bilirubin 0.4 0.2 - 1.2 mg/dL   Alkaline Phosphatase 85 39 - 117 U/L   AST 25 0 - 37 U/L   ALT 32 0 - 35 U/L   Total Protein 7.8 6.0 - 8.3 g/dL   Albumin 4.4 3.5 - 5.2 g/dL   Calcium 9.8 8.4 - 78.4 mg/dL   GFR 696.29 >52.84 mL/min  TSH  Result Value Ref Range   TSH 2.16 0.35 - 4.50 uIU/mL  Hemoglobin A1c  Result Value Ref Range   Hgb A1c MFr Bld 5.8 4.6 - 6.5 %  Insulin, Free (Bioactive)  Result Value Ref Range   Insulin, Free 18.5 (H) 1.5 - 14.9 uIU/mL   Assessment and Plan:    Eladia was seen today for follow-up and pcos.  Diagnoses and all orders for this visit:  Polycystic ovarian syndrome Comments: Patient followed by OBGYN. She will make an appointment for preconception counseling. Restart PNV.  Prediabetes Comments: Drug rash most likely due to Phentermine. Okay to retry Metformin. Precautions reviewed.  Morbid (severe) obesity due to excess calories (HCC) Comments: Continue low carbohydrate diet (patient doing Paleo) and exercise regimen at the gym.   . Reviewed expectations re: course of current medical issues. . Discussed self-management of symptoms. . Outlined signs and symptoms indicating need for more acute intervention. . Patient verbalized understanding and all questions were answered. . See orders for this visit as documented in the electronic medical record. . Patient received an After Visit Summary.   CMA served as Neurosurgeon during this  visit. History, Physical, and Plan performed by medical provider. Documentation and orders reviewed and attested to. Helane Rima, D.O.  Helane Rima, D.O. [place], [facility] 03/09/2017

## 2017-03-09 NOTE — Patient Instructions (Signed)
It looks like Dr. Mora Appl is both OB/GYN.  Go ahead and call to see how long it may take to see her.  Restart your prenatal vitamins.  Restart the Metformin in 2 weeks.

## 2017-04-03 ENCOUNTER — Telehealth: Payer: Self-pay | Admitting: Family Medicine

## 2017-04-03 NOTE — Telephone Encounter (Signed)
Spoke with patient and she has started taking the Metformin ago as of Saturday. Two days ago she started having a metallic taste in her mouth. She is not having any other symptoms. Please advise.

## 2017-04-03 NOTE — Telephone Encounter (Signed)
Patient called in asking that you give her a call. She would not give me any information other than it was about her script  Verified cell #

## 2017-04-06 NOTE — Telephone Encounter (Signed)
Check in and see if the patient is still having that symptom. She cannot tolerate the medication have her stop it and let me now.

## 2017-04-07 NOTE — Telephone Encounter (Signed)
Left a message for patient to call back. 

## 2017-04-08 NOTE — Telephone Encounter (Signed)
Left message for patient to return call.

## 2017-04-08 NOTE — Telephone Encounter (Signed)
Spoke with patient and she stated she stopped the medication over the weekend due to not getting a call back. The metallic taste did go away, but she feels like the taste buds are affected by the medication. She has a burning taste on her tongue. She wants to know if it is safe to take the medication. She does not want to stop the medication because she stated that this is the best medication to treat her. Please advise.

## 2017-04-08 NOTE — Telephone Encounter (Signed)
LM for patient to return call.

## 2017-04-08 NOTE — Telephone Encounter (Signed)
Patient called back. No answer from your phone.  She states that she can be reached between now and 12. Please call her back.

## 2017-04-21 ENCOUNTER — Telehealth: Payer: Self-pay | Admitting: Family Medicine

## 2017-04-21 NOTE — Telephone Encounter (Signed)
Patient faxed paperwork needed for school. Spoke with Amalia HaileyDustin this morning and made aware of our paperwork policies.

## 2017-04-22 ENCOUNTER — Telehealth: Payer: Self-pay

## 2017-04-22 NOTE — Telephone Encounter (Signed)
Patient dropped off paperwork that she needs filled out for school.  [facility].  The other form is a physical exam form.  Patient has not had a physical exam with Dr. Earlene PlaterWallace.  I tried to reach the patient to explain that she will need to schedule an appointment for a physical, but was unable to reach her and left a message.  I also sent her a message via MyChart.

## 2017-05-04 NOTE — Telephone Encounter (Signed)
Patient dropped off additional Immunization records. Placed in Col[employer]ace's folder.  Thank you,  -LL

## 2017-05-04 NOTE — Telephone Encounter (Signed)
Patient has physical done with Dr. Jillyn [facility]ulp at [facility] in September 2017 (no longer with the practice). Is it possible to just get these records if not already have done so?  Patient will bring by varicella and the TB results so the office can have it.   Call patient with any further questions.

## 2017-05-06 NOTE — Telephone Encounter (Signed)
Patient is scheduled for physical exam on 05/11/2017.

## 2017-05-06 NOTE — Telephone Encounter (Signed)
LM for patient to return call.  She needs to schedule an appointment for a physical with Dr. Earlene PlaterWallace in order for us to fill out paperwork.  We cannot use documentation from another provider for this.  If patient wants to try to get the other provider to fill out forms based on her last physical, she is welcome to come pick up her paperwork to take to them.    Will also send MyChart message.

## 2017-05-11 ENCOUNTER — Ambulatory Visit (INDEPENDENT_AMBULATORY_CARE_PROVIDER_SITE_OTHER): Payer: 59 | Admitting: Family Medicine

## 2017-05-11 ENCOUNTER — Encounter: Payer: Self-pay | Admitting: Family Medicine

## 2017-05-11 VITALS — BP 124/76 | HR 80 | Temp 97.5°F | Ht 64.0 in | Wt 258.0 lb

## 2017-05-11 DIAGNOSIS — Z0000 Encounter for general adult medical examination without abnormal findings: Secondary | ICD-10-CM

## 2017-05-11 DIAGNOSIS — E282 Polycystic ovarian syndrome: Secondary | ICD-10-CM | POA: Diagnosis not present

## 2017-05-11 MED ORDER — METFORMIN HCL 500 MG PO TABS: 500.0000 mg | ORAL_TABLET | Freq: Two times a day (BID) | ORAL | 3 refills | Status: DC

## 2017-05-11 MED ORDER — LIRAGLUTIDE 18 MG/3ML ~~LOC~~ SOPN: PEN_INJECTOR | SUBCUTANEOUS | 3 refills | Status: DC

## 2017-05-11 NOTE — Progress Notes (Signed)
Jacqueline [name] is a 26 y.o. female is here for follow up.  History of Present Illness:   Insurance claims handlerAmber Agner, CMA, acting as scribe for Dr. Earlene PlaterWallace.  HPI  Patient comes in today needing a physical form filled out for school.  She has no concerns or complaints today.  There are no preventive care reminders to display for this patient.  PMHx, SurgHx, SocialHx, FamHx, Medications, and Allergies were reviewed in the Visit Navigator and updated as appropriate.   Patient Active Problem List   Diagnosis Date Noted  . Prediabetes 02/07/2017  . Migraines   . GERD (gastroesophageal reflux disease)   . Polycystic ovarian syndrome 02/03/2017  . Vitamin D deficiency 02/03/2017  . Morbid (severe) obesity due to excess calories (HCC) 02/03/2017   Social History  Substance Use Topics  . Smoking status: Never Smoker  . Smokeless tobacco: Never Used  . Alcohol use 0.0 oz/week     Comment: social   Current Medications and Allergies:   .  acetaminophen (TYLENOL) 325 MG tablet, Take 650 mg by mouth every 6 (six) hours as needed., Disp: , Rfl:  .  aspirin-acetaminophen-caffeine (EXCEDRIN MIGRAINE) 250-250-65 MG tablet, Take by mouth every 6 (six) hours as needed for headache., Disp: , Rfl:  .  esomeprazole (NEXIUM) 40 MG capsule, Take 1 capsule (40 mg total) by mouth 2 (two) times daily before a meal. (Patient taking differently: Take 40 mg by mouth as needed. ), Disp: 60 capsule, Rfl: 0 .  levonorgestrel-ethinyl estradiol (AVIANE,ALESSE,LESSINA) 0.1-20 MG-MCG tablet, Take 1 tablet by mouth daily., Disp: 1 Package, Rfl: 5 .  ondansetron (ZOFRAN) 4 MG tablet, Take 4 mg by mouth as needed., Disp: , Rfl:  .  Prenatal Vit-DSS-Fe Fum-FA (THRIVITE 19) 29-1 MG TABS, Take 1 tablet by mouth daily., Disp: 30 tablet, Rfl: 6 .  Probiotic Product (PROBIOTIC DAILY PO), Take by mouth., Disp: , Rfl:   Allergies  Allergen Reactions  . Colchicine Diarrhea and Other (See Comments)    DIARRHEA AND MOUTH SORES  .  Indomethacin Other (See Comments)    Blood in stool   Review of Systems   Review of Systems  All other systems reviewed and are negative.  Vitals:   Vitals:   05/11/17 0832  BP: 124/76  Pulse: 80  Temp: 97.5 F (36.4 C)  TempSrc: Oral  SpO2: 99%  Weight: 258 lb (117 kg)  Height: 5\' 4"  (1.626 m)     Body mass index is 44.29 kg/m.  Physical Exam:   Physical Exam  Constitutional: She is oriented to person, place, and time. She appears well-developed and well-nourished. No distress.  HENT:  Head: Normocephalic and atraumatic.  Right Ear: External ear normal.  Left Ear: External ear normal.  Nose: Nose normal.  Mouth/Throat: Oropharynx is clear and moist.  Eyes: Conjunctivae and EOM are normal. Pupils are equal, round, and reactive to light.  Neck: Normal range of motion. Neck supple. No thyromegaly present.  Cardiovascular: Normal rate, regular rhythm, normal heart sounds and intact distal pulses.   Pulmonary/Chest: Effort normal and breath sounds normal.  Abdominal: Soft. Bowel sounds are normal.  Musculoskeletal: Normal range of motion.  Neurological: She is alert and oriented to person, place, and time.  Skin: Skin is warm and dry. Capillary refill takes less than 2 seconds.  Psychiatric: She has a normal mood and affect. Her behavior is normal.  Nursing note and vitals reviewed.   Assessment and Plan:   Joni Reiningicole was seen today for follow-up.  Diagnoses and all orders for this visit:  Routine physical examination  Morbid obesity (HCC) -     metFORMIN (GLUCOPHAGE) 500 MG tablet; Take 1 tablet (500 mg total) by mouth 2 (two) times daily with a meal. -     liraglutide 18 MG/3ML SOPN; You should take the VICTOZA pen once a day. Start with the lowest (0.6) setting.  After a few days, increase to 1.2.  If you still have little or no nausea, increase to the highest (1.8) setting, and continue with that setting.  Polycystic ovarian syndrome -     metFORMIN (GLUCOPHAGE)  500 MG tablet; Take 1 tablet (500 mg total) by mouth 2 (two) times daily with a meal. -     liraglutide 18 MG/3ML SOPN; You should take the VICTOZA pen once a day. Start with the lowest (0.6) setting.  After a few days, increase to 1.2.  If you still have little or no nausea, increase to the highest (1.8) setting, and continue with that setting.   Patient Counseling:   [x]     Nutrition: Stressed importance of moderation in sodium/caffeine intake, saturated fat and cholesterol, caloric balance, sufficient intake of fresh fruits, vegetables, fiber, calcium, iron, and 1 mg of folate supplement per day (for females capable of pregnancy).   [x]      Stressed the importance of regular exercise.    [x]     Substance Abuse: Discussed cessation/primary prevention of tobacco, alcohol, or other drug use; driving or other dangerous activities under the influence; availability of treatment for abuse.    [x]      Injury prevention: Discussed safety belts, safety helmets, smoke detector, smoking near bedding or upholstery.    [x]      Sexuality: Discussed sexually transmitted diseases, partner selection, use of condoms, avoidance of unintended pregnancy  and contraceptive alternatives.    [x]     Dental health: Discussed importance of regular tooth brushing, flossing, and dental visits.   [x]      Health maintenance and immunizations reviewed. Please refer to Health maintenance section.    . Reviewed expectations re: course of current medical issues. . Discussed self-management of symptoms. . Outlined signs and symptoms indicating need for more acute intervention. . Patient verbalized understanding and all questions were answered. Marland Kitchen Health Maintenance issues including appropriate healthy diet, exercise, and smoking avoidance were discussed with patient. . See orders for this visit as documented in the electronic medical record. . Patient received an After Visit Summary.  CMA served as Neurosurgeon during  this visit. History, Physical, and Plan performed by medical provider. The above documentation has been reviewed and is accurate and complete. Helane Rima, D.O.  Helane Rima, DO [place] 05/11/2017

## 2017-06-16 ENCOUNTER — Encounter: Payer: Self-pay | Admitting: Family Medicine

## 2017-06-17 ENCOUNTER — Other Ambulatory Visit: Payer: Self-pay | Admitting: Surgical

## 2017-06-17 DIAGNOSIS — E282 Polycystic ovarian syndrome: Secondary | ICD-10-CM

## 2017-06-17 MED ORDER — INSULIN PEN NEEDLE 31G X 6 MM MISC: 2 refills | Status: DC

## 2017-06-17 MED ORDER — LIRAGLUTIDE 18 MG/3ML ~~LOC~~ SOPN: PEN_INJECTOR | SUBCUTANEOUS | 3 refills | Status: DC

## 2017-07-07 ENCOUNTER — Telehealth: Payer: Self-pay

## 2017-07-07 NOTE — Telephone Encounter (Signed)
PA for Victoza was approved through 06/29/2018.  Pharmacy notified.

## 2017-07-14 MED FILL — LEVONOR-ETH ESTRAD 0.1-0.02: 0.1-20 | 84 days supply | Qty: 84 | Fill #1

## 2017-07-14 MED FILL — metFORMIN HCL 500 MG TABS: 500 | 90 days supply | Qty: 180 | Fill #0

## 2017-08-10 ENCOUNTER — Telehealth: Payer: Self-pay | Admitting: Family Medicine

## 2017-08-10 NOTE — Telephone Encounter (Signed)
Please advise 

## 2017-08-10 NOTE — Telephone Encounter (Signed)
Patient was seen several months ago and was advised to take magnesium for headaches. She states that she is still getting a lot of migraines. She is wondering if something else can be prescribed. She states that she does not want to take a daily medicine. She is just wondering if she can get something that is used when she gets migraine. She has tried topamax and frova without it helping. She wants to know if she can try fiorcet for migraines. Pt uses [facility] [facility].

## 2017-08-11 ENCOUNTER — Other Ambulatory Visit: Payer: Self-pay

## 2017-08-11 MED ORDER — SUMATRIPTAN SUCCINATE 100 MG PO TABS
ORAL_TABLET | ORAL | 2 refills | Status: DC
Start: 2017-08-11 — End: 2018-12-12

## 2017-08-11 MED FILL — SUMATRIPTAN SUCC 100 MG TAB: 100 | 30 days supply | Qty: 8 | Fill #0

## 2017-08-11 NOTE — Telephone Encounter (Signed)
Okay to call in Imitrex.

## 2017-08-11 NOTE — Telephone Encounter (Signed)
Spoke with patient.  She has not tried Imitrex before.  Per Dr. Earlene Plater, send in Imitrex 100 mg tabs and have patient take 1/2 tab at headache onset.  Explained to patient that Imitrex works best if taken as soon as she feels the headache coming on.  Also advised her that it can make her drowsy so be careful when starting the medication until she knows how it makes her feel.  Also advised that she reduces the amount of Excedrin/Tylenol she is taking to avoid rebound headaches.  Patient verbalized understanding to all.  Rx for Imitrex sent to patient's pharmacy.

## 2017-09-11 ENCOUNTER — Telehealth: Payer: Self-pay | Admitting: Family Medicine

## 2017-09-11 NOTE — Telephone Encounter (Signed)
Spoke with patient and she had an EKG done today that showed a Q wave. She is wanting Dr. Earlene PlaterWallace to compare EKG from April. I advised that we are not able to do this over the phone and she would need to be seen. Patient stated that she has been having chest pain for the last couple days and that is why she done the EKG. Patient stated that she has had para carditis in the past and this is what it feels like. I put her on Dr. Jimmey RalphParker schedule for Monday afternoon per patient because she wanted late afternoon appointment on Monday. I advised that if pain gets worse she would need to go to the ED. Dr. Earlene PlaterWallace heard me advise patient several times to go to the ED. I went and showed [name] the appointment and explained to her what was going on.

## 2017-09-11 NOTE — Telephone Encounter (Signed)
Patient called in reference to having questions about recent EKG done at last visit. Please call patient and advise. OK to leave message.

## 2017-09-14 ENCOUNTER — Encounter: Payer: Self-pay | Admitting: Family Medicine

## 2017-09-14 ENCOUNTER — Ambulatory Visit (INDEPENDENT_AMBULATORY_CARE_PROVIDER_SITE_OTHER): Payer: 59 | Admitting: Family Medicine

## 2017-09-14 VITALS — BP 108/80 | HR 92 | Ht 64.0 in | Wt 265.0 lb

## 2017-09-14 DIAGNOSIS — R0789 Other chest pain: Secondary | ICD-10-CM | POA: Diagnosis not present

## 2017-09-14 NOTE — Progress Notes (Signed)
   Subjective:  Jacqueline [name] is a 26 y.o. female who presents today with a chief complaint of chest pain.   HPI:  Chest Discomfort, acute issue Symptoms started about a week ago.  Chest pain is located in her left upper and right upper chest.  Thinks is related to reflux.  She does have a history of pericarditis but pain is not similar to that.  Pain is worsened over the past week.  Symptoms are worse at night when lying down.  She has had a little bit of shortness of breath due to some congestion.  No pain or shortness of breath with exertion.  No cough, sneeze, rhinorrhea.  No fevers or chills.  Sometimes sensation will go up into her throat-this also makes her think that is related to reflux.  Patient had an EKG performed last week which showed Q waves in her inferior and lateral leads.  Patient was concerned that this may indicate a cardiac etiology.  ROS: Per HPI  Objective:  Physical Exam: BP 108/80   Pulse 92   Ht 5\' 4"  (1.626 m)   Wt 265 lb (120.2 kg)   SpO2 96%   BMI 45.49 kg/m   Gen: NAD, resting comfortably CV: RRR with no murmurs appreciated Pulm: NWOB, CTAB with no crackles, wheezes, or rhonchi MSK: No edema, cyanosis, or clubbing noted Skin: Warm, dry Neuro: Grossly normal, moves all extremities Psych: Normal affect and thought content  EKG: NSR, very small non-pathologic qwaves in inferior and lateral leads  Assessment/Plan:  Chest Discomfort EKG today normal.  Symptoms likely noncardiac in origin given her normal EKG and nonexertional symptoms..  Possibly reflux based on her symptoms.  She does have some mild chest wall tenderness-may have costochondritis.  Patient's old EKG reviewed which did show some Q waves that were not pathologic.  Discussed with patient and reassured her.  Also reviewed today's EKG with patient.  Discussed the difference between pathologic Q waves and nonpathologic Q waves.  Advised patient to try Nexium 40 mg twice daily for the next week.   Also advised her to try ibuprofen 600 mg to treat any possible costochondritis.  Return precautions reviewed.  Katina Degreealeb M. Jimmey RalphParker, MD 09/14/2017 4:29 PM

## 2018-05-17 ENCOUNTER — Ambulatory Visit (INDEPENDENT_AMBULATORY_CARE_PROVIDER_SITE_OTHER): Payer: No Typology Code available for payment source

## 2018-05-17 ENCOUNTER — Encounter: Payer: Self-pay | Admitting: Physician Assistant

## 2018-05-17 ENCOUNTER — Ambulatory Visit (INDEPENDENT_AMBULATORY_CARE_PROVIDER_SITE_OTHER): Payer: No Typology Code available for payment source | Admitting: Physician Assistant

## 2018-05-17 VITALS — BP 138/80 | HR 75 | Temp 98.4°F | Ht 64.0 in | Wt 272.0 lb

## 2018-05-17 DIAGNOSIS — M25572 Pain in left ankle and joints of left foot: Secondary | ICD-10-CM | POA: Diagnosis not present

## 2018-05-17 LAB — POCT URINE PREGNANCY: Preg Test, Ur: NEGATIVE

## 2018-05-17 NOTE — Progress Notes (Signed)
Jacqueline [name] is a 27 y.o. female here for a new problem.  I acted as a Neurosurgeon for [employer], PA-C [employer], LPN.  History of Present Illness:   Chief Complaint  Patient presents with  . Left ankle injury    Ankle Injury   The incident occurred 3 to 5 days ago (Happened on Friday). The incident occurred in the yard (at a friends house). The injury mechanism was a fall and a twisting injury (Fell in grass hole). The pain is present in the left ankle. The quality of the pain is described as aching. The pain is at a severity of 3/10. The pain is mild. The pain has been constant since onset. Associated symptoms include an inability to bear weight, a loss of motion (limited) and muscle weakness. Pertinent negatives include no numbness or tingling. Associated symptoms comments: Left Ankle swelling and toes a little tingling.. She reports no foreign bodies present. The symptoms are aggravated by movement and weight bearing. She has tried elevation, ice, heat and NSAIDs for the symptoms. The treatment provided mild relief.    Past Medical History:  Diagnosis Date  . Adenopathy, Left Cervical   . GERD (gastroesophageal reflux disease)   . History of Epstein-Barr virus infection   . Migraines   . Morbid (severe) obesity due to excess calories (HCC) 02/03/2017  . PCOS (polycystic ovarian syndrome)   . Pericarditis 11/11/2015  . Vitamin D deficiency 02/03/2017     Social History   Socioeconomic History  . Marital status: Single    Spouse name: Not on file  . Number of children: Not on file  . Years of education: Not on file  . Highest education level: Not on file  Occupational History    Employer: [employer]  Social Needs  . Financial resource strain: Not on file  . Food insecurity:    Worry: Not on file    Inability: Not on file  . Transportation needs:    Medical: Not on file    Non-medical: Not on file  Tobacco Use  . Smoking status: Never Smoker  . Smokeless  tobacco: Never Used  Substance and Sexual Activity  . Alcohol use: Yes    Alcohol/week: 0.0 oz    Comment: social  . Drug use: No  . Sexual activity: Yes    Birth control/protection: Pill  Lifestyle  . Physical activity:    Days per week: Not on file    Minutes per session: Not on file  . Stress: Not on file  Relationships  . Social connections:    Talks on phone: Not on file    Gets together: Not on file    Attends religious service: Not on file    Active member of club or organization: Not on file    Attends meetings of clubs or organizations: Not on file    Relationship status: Not on file  . Intimate partner violence:    Fear of current or ex partner: Not on file    Emotionally abused: Not on file    Physically abused: Not on file    Forced sexual activity: Not on file  Other Topics Concern  . Not on file  Social History Narrative  . Not on file    Past Surgical History:  Procedure Laterality Date  . LYMPH NODE BIOPSY Left 12/11/2014   Procedure: LYMPH NODE BIOPSY;  Surgeon: Flo Shanks, MD;  Location: [facility];  Service: ENT;  Laterality: Left;  .  WISDOM TOOTH EXTRACTION      Family History  Problem Relation Age of Onset  . Diabetes Mother   . Hypertension Mother   . Cancer Maternal Grandmother        Hodgkin's Lymphoma  . Diabetes Paternal Grandmother   . Heart disease Paternal Grandmother   . Heart disease Paternal Grandfather     Allergies  Allergen Reactions  . Colchicine Diarrhea and Other (See Comments)    DIARRHEA AND MOUTH SORES  . Indomethacin Other (See Comments)    Blood in stool   . Phentermine Rash    Current Medications:   Current Outpatient Medications:  .  acetaminophen (TYLENOL) 325 MG tablet, Take 650 mg by mouth every 6 (six) hours as needed., Disp: , Rfl:  .  aspirin-acetaminophen-caffeine (EXCEDRIN MIGRAINE) 250-250-65 MG tablet, Take by mouth every 6 (six) hours as needed for headache., Disp: , Rfl:  .   SUMAtriptan (IMITREX) 100 MG tablet, Take 1/2 to 1 tablet at headache onset. May repeat in 2 hours if headache persists or recurs., Disp: 10 tablet, Rfl: 2   Review of Systems:   Review of Systems  Neurological: Negative for tingling and numbness.    Vitals:   Vitals:   05/17/18 1556  BP: 138/80  Pulse: 75  Temp: 98.4 F (36.9 C)  TempSrc: Oral  SpO2: 100%  Weight: 272 lb (123.4 kg)  Height: 5\' 4"  (1.626 m)     Body mass index is 46.69 kg/m.  Physical Exam:   Physical Exam  Constitutional: She is oriented to person, place, and time. She appears well-developed and well-nourished.  HENT:  Head: Normocephalic and atraumatic.  Eyes: Conjunctivae and EOM are normal.  Neck: Normal range of motion. Neck supple.  Pulmonary/Chest: Effort normal.  Musculoskeletal: Normal range of motion.  Foot & Ankle: Overall foot and ankle are well aligned, no significant deformity. No significant TTP over the base of the 5th metatarsal, navicular, or posterior aspect of medial or lateral malleolus Swelling to lateral malleolus of L ankle. Decreased ROM with inversion of L foot.   Neurological: She is alert and oriented to person, place, and time.  Skin: Skin is warm and dry.  Psychiatric: She has a normal mood and affect. Her behavior is normal. Judgment and thought content normal.    Results for orders placed or performed in visit on 05/17/18  POCT urine pregnancy  Result Value Ref Range   Preg Test, Ur Negative Negative   Ankle xray without acute abnormality  Assessment and Plan:    Jacqueline [name] was seen today for left ankle injury.  Diagnoses and all orders for this visit:  Acute left ankle pain   No acute abnormality on initial ankle xray read, however I am awaiting official xray read. Discussed elevation, submerging foot in cool water for swelling, NSAIDs and use of immobilizer. Follow-up with Dr. Berline Choughigby in 2 weeks if symptoms persist, sooner if they worsen.   . Reviewed  expectations re: course of current medical issues. . Discussed self-management of symptoms. . Outlined signs and symptoms indicating need for more acute intervention. . Patient verbalized understanding and all questions were answered. . See orders for this visit as documented in the electronic medical record. . Patient received an After-Visit Summary.  CMA or LPN served as scribe during this visit. History, Physical, and Plan performed by medical provider. Documentation and orders reviewed and attested to.   Jarold MottoSamantha Worley, PA-C

## 2018-05-17 NOTE — Patient Instructions (Addendum)
It was great to see you.  Keep area elevated as much as possible.  Submerge foot in cool water to help with swelling and compression.  Follow-up with Dr. Berline Choughigby in two weeks for further evaluation and treatment, sooner if your pain worsens.  May consider ibuprofen for you swelling, I recommend taking stomach medication such as Zantac to help prevent stomach irritation.

## 2018-05-19 ENCOUNTER — Telehealth: Payer: Self-pay | Admitting: Physician Assistant

## 2018-05-19 NOTE — Telephone Encounter (Signed)
See note, possibly about 05/17/18 visit.   Copied from CRM [id number]. Topic: General - Other >> May 19, 2018  3:27 PM Luanna Coleawoud, Jessica L wrote: Reason for CRM: pt has been called twice with no vm. No notes that I see Please advise

## 2018-05-20 NOTE — Telephone Encounter (Signed)
Tried to contact pt again unable to leave message again due to mailbox is full not accepting messages. If patient calls back please tell her xray was normal, no fracture.

## 2018-05-21 NOTE — Telephone Encounter (Signed)
See result note. Pt notified

## 2018-06-08 ENCOUNTER — Encounter: Payer: Self-pay | Admitting: Family Medicine

## 2018-06-09 ENCOUNTER — Ambulatory Visit (INDEPENDENT_AMBULATORY_CARE_PROVIDER_SITE_OTHER): Payer: No Typology Code available for payment source | Admitting: Family Medicine

## 2018-06-09 ENCOUNTER — Encounter: Payer: Self-pay | Admitting: Family Medicine

## 2018-06-09 ENCOUNTER — Telehealth: Payer: Self-pay | Admitting: Family Medicine

## 2018-06-09 VITALS — BP 128/82 | HR 77 | Temp 97.8°F | Ht 64.0 in | Wt 266.8 lb

## 2018-06-09 DIAGNOSIS — Z0000 Encounter for general adult medical examination without abnormal findings: Secondary | ICD-10-CM

## 2018-06-09 DIAGNOSIS — N912 Amenorrhea, unspecified: Secondary | ICD-10-CM

## 2018-06-09 DIAGNOSIS — E8881 Metabolic syndrome: Secondary | ICD-10-CM

## 2018-06-09 DIAGNOSIS — Z79899 Other long term (current) drug therapy: Secondary | ICD-10-CM | POA: Diagnosis not present

## 2018-06-09 DIAGNOSIS — G43009 Migraine without aura, not intractable, without status migrainosus: Secondary | ICD-10-CM

## 2018-06-09 DIAGNOSIS — E559 Vitamin D deficiency, unspecified: Secondary | ICD-10-CM | POA: Diagnosis not present

## 2018-06-09 DIAGNOSIS — E282 Polycystic ovarian syndrome: Secondary | ICD-10-CM | POA: Diagnosis not present

## 2018-06-09 DIAGNOSIS — E88819 Insulin resistance, unspecified: Secondary | ICD-10-CM

## 2018-06-09 DIAGNOSIS — K219 Gastro-esophageal reflux disease without esophagitis: Secondary | ICD-10-CM

## 2018-06-09 LAB — CBC WITH DIFFERENTIAL/PLATELET
Basophils Absolute: 0 10*3/uL (ref 0.0–0.1)
Basophils Relative: 0.7 % (ref 0.0–3.0)
Eosinophils Absolute: 0.1 10*3/uL (ref 0.0–0.7)
Eosinophils Relative: 2.3 % (ref 0.0–5.0)
HCT: 42.1 % (ref 36.0–46.0)
Hemoglobin: 14.2 g/dL (ref 12.0–15.0)
Lymphocytes Relative: 42.8 % (ref 12.0–46.0)
Lymphs Abs: 2.3 10*3/uL (ref 0.7–4.0)
MCHC: 33.6 g/dL (ref 30.0–36.0)
MCV: 83.7 fl (ref 78.0–100.0)
Monocytes Absolute: 0.3 10*3/uL (ref 0.1–1.0)
Monocytes Relative: 5.6 % (ref 3.0–12.0)
Neutro Abs: 2.6 10*3/uL (ref 1.4–7.7)
Neutrophils Relative %: 48.6 % (ref 43.0–77.0)
Platelets: 260 10*3/uL (ref 150.0–400.0)
RBC: 5.03 Mil/uL (ref 3.87–5.11)
RDW: 14.3 % (ref 11.5–15.5)
WBC: 5.4 10*3/uL (ref 4.0–10.5)

## 2018-06-09 LAB — LIPID PANEL
Cholesterol: 197 mg/dL (ref 0–200)
HDL: 57.5 mg/dL (ref >39.00)
LDL Cholesterol: 100 mg/dL — ABNORMAL HIGH (ref 0–99)
NonHDL: 139.52
Total CHOL/HDL Ratio: 3
Triglycerides: 199 mg/dL — ABNORMAL HIGH (ref 0.0–149.0)
VLDL: 39.8 mg/dL (ref 0.0–40.0)

## 2018-06-09 LAB — COMPREHENSIVE METABOLIC PANEL
ALT: 33 U/L (ref 0–35)
AST: 24 U/L (ref 0–37)
Albumin: 4.4 g/dL (ref 3.5–5.2)
Alkaline Phosphatase: 76 U/L (ref 39–117)
BUN: 10 mg/dL (ref 6–23)
CO2: 27 mEq/L (ref 19–32)
Calcium: 9.7 mg/dL (ref 8.4–10.5)
Chloride: 104 mEq/L (ref 96–112)
Creatinine, Ser: 0.65 mg/dL (ref 0.40–1.20)
GFR: 114.8 mL/min (ref >60.00)
Glucose, Bld: 102 mg/dL — ABNORMAL HIGH (ref 70–99)
Potassium: 4.2 mEq/L (ref 3.5–5.1)
Sodium: 139 mEq/L (ref 135–145)
Total Bilirubin: 0.5 mg/dL (ref 0.2–1.2)
Total Protein: 7.4 g/dL (ref 6.0–8.3)

## 2018-06-09 LAB — TSH: TSH: 1.61 u[IU]/mL (ref 0.35–4.50)

## 2018-06-09 LAB — VITAMIN B12: Vitamin B-12: 241 pg/mL (ref 211–911)

## 2018-06-09 LAB — VITAMIN D 25 HYDROXY (VIT D DEFICIENCY, FRACTURES): VITD: 19.95 ng/mL — ABNORMAL LOW (ref 30.00–100.00)

## 2018-06-09 LAB — HEMOGLOBIN A1C: Hgb A1c MFr Bld: 5.8 % (ref 4.6–6.5)

## 2018-06-09 MED ORDER — SPIRONOLACTONE 50 MG PO TABS: 50.0000 mg | ORAL_TABLET | Freq: Every day | ORAL | 3 refills | Status: DC

## 2018-06-09 MED ORDER — METFORMIN HCL ER 750 MG PO TB24: 1500.0000 mg | ORAL_TABLET | Freq: Every day | ORAL | 3 refills | Status: DC

## 2018-06-09 MED ORDER — ESOMEPRAZOLE MAGNESIUM 40 MG PO PACK: 40.0000 mg | PACK | Freq: Every day | ORAL | 12 refills | Status: DC

## 2018-06-09 MED ORDER — METFORMIN HCL ER 750 MG PO TB24: 750.0000 mg | ORAL_TABLET | Freq: Every day | ORAL | 3 refills | Status: DC

## 2018-06-09 MED FILL — SPIRONOLACTONE 50 MG TABS: 50 | 30 days supply | Qty: 30 | Fill #0

## 2018-06-09 MED FILL — metFORMIN HCL ER 750 MG TB2: 750 | 30 days supply | Qty: 60 | Fill #0

## 2018-06-09 MED FILL — ESOMEPRAZOLE MAG DR 40 MG C: 40 | 30 days supply | Qty: 30 | Fill #0

## 2018-06-09 NOTE — Progress Notes (Signed)
Subjective:    Jacqueline [name] is a 27 y.o. female and is here for a comprehensive physical exam.  Current Outpatient Medications:  .  None  There are no preventive care reminders to display for this patient.  PMHx, SurgHx, SocialHx, Medications, and Allergies were reviewed in the Visit Navigator and updated as appropriate.   Past Medical History:  Diagnosis Date  . Adenopathy, Left Cervical   . GERD (gastroesophageal reflux disease)   . History of Epstein-Barr virus infection   . Migraines   . Morbid (severe) obesity due to excess calories (HCC) 02/03/2017  . PCOS (polycystic ovarian syndrome)   . Pericarditis 11/11/2015  . Vitamin D deficiency 02/03/2017     Past Surgical History:  Procedure Laterality Date  . LYMPH NODE BIOPSY Left 12/11/2014   Procedure: LYMPH NODE BIOPSY;  Surgeon: Flo Shanks, MD;  Location: [facility];  Service: ENT;  Laterality: Left;  . WISDOM TOOTH EXTRACTION       Family History  Problem Relation Age of Onset  . Diabetes Mother   . Hypertension Mother   . Cancer Maternal Grandmother        Hodgkin's Lymphoma  . Diabetes Paternal Grandmother   . Heart disease Paternal Grandmother   . Heart disease Paternal Grandfather     Social History   Tobacco Use  . Smoking status: Never Smoker  . Smokeless tobacco: Never Used  Substance Use Topics  . Alcohol use: Yes    Alcohol/week: 0.0 oz    Comment: social  . Drug use: No    Review of Systems:   Pertinent items are noted in the HPI. Otherwise, ROS is negative.  Objective:   BP 128/82   Pulse 77   Temp 97.8 F (36.6 C) (Oral)   Ht 5\' 4"  (1.626 m)   Wt 266 lb 12.8 oz (121 kg)   SpO2 98%   BMI 45.80 kg/m    General appearance: alert, cooperative and appears stated age. Head: normocephalic, without obvious abnormality, atraumatic. Neck: no adenopathy, supple, symmetrical, trachea midline; thyroid not enlarged, symmetric, no tenderness/mass/nodules. Lungs: clear  to auscultation bilaterally. Heart: regular rate and rhythm Abdomen: soft, non-tender; no masses,  no organomegaly. Extremities: extremities normal, atraumatic, no cyanosis or edema. Skin: skin color, texture, turgor normal, no rashes or lesions. Lymph: cervical, supraclavicular, and axillary nodes normal; no abnormal inguinal nodes palpated. Neurologic: grossly normal.  Assessment/Plan:   Jacqueline [name] was seen today for annual exam.  Diagnoses and all orders for this visit:  Routine physical examination  Polycystic ovarian syndrome -     CBC with Differential/Platelet -     Comprehensive metabolic panel -     Testos,Total,Free and SHBG (Female) -     spironolactone (ALDACTONE) 50 MG tablet; Take 1 tablet (50 mg total) by mouth daily. -     Ambulatory referral to Gynecology  Insulin resistance Comments: Stopped Metformin over the last year. Ready to restart. Will give extended release with goal 1500 mg daily.  Orders: -     Hemoglobin A1c -     Discontinue: metFORMIN (GLUCOPHAGE XR) 750 MG 24 hr tablet; Take 1 tablet (750 mg total) by mouth daily with breakfast. -     metFORMIN (GLUCOPHAGE XR) 750 MG 24 hr tablet; Take 2 tablets (1,500 mg total) by mouth daily with breakfast.  Vitamin D deficiency -     VITAMIN D 25 Hydroxy (Vit-D Deficiency, Fractures)  Morbid (severe) obesity due to excess calories (HCC) -  Lipid panel -     TSH  Migraine without aura Comments: Resolved since stopped OCPs.   Medication management Comments: Will check B12 today since she has been taking Metformin.  Orders: -     Vitamin B12  Gastroesophageal reflux disease, esophagitis presence not specified -     esomeprazole (NEXIUM) 40 MG packet; Take 40 mg by mouth daily before breakfast.  Amenorrhea Comments: No menses in 1 year. Not interested in hormonal control. Will send to GYN to see if Provera appropriate until she is ready to conceive.    Patient Counseling:   [x]     Nutrition:  Stressed importance of moderation in sodium/caffeine intake, saturated fat and cholesterol, caloric balance, sufficient intake of fresh fruits, vegetables, fiber, calcium, iron, and 1 mg of folate supplement per day (for females capable of pregnancy).   [x]      Stressed the importance of regular exercise.    [x]     Substance Abuse: Discussed cessation/primary prevention of tobacco, alcohol, or other drug use; driving or other dangerous activities under the influence; availability of treatment for abuse.    [x]      Injury prevention: Discussed safety belts, safety helmets, smoke detector, smoking near bedding or upholstery.    [x]      Sexuality: Discussed sexually transmitted diseases, partner selection, use of condoms, avoidance of unintended pregnancy  and contraceptive alternatives.    [x]     Dental health: Discussed importance of regular tooth brushing, flossing, and dental visits.   [x]      Health maintenance and immunizations reviewed. Please refer to Health maintenance section.   [name], DO [facility]

## 2018-06-09 NOTE — Telephone Encounter (Signed)
Copied from CRM [id number]. Topic: General - Other >> Jun 09, 2018 10:31 AM Tamela OddiHarris, Brenda J wrote: Reason for CRM: Maxine GlennMonica [facility] called to get clarification on prescription.  She received two prescriptions for metFORMIN (GLUCOPHAGE XR) 750 MG 24 hr tablet.  One is for 1 a day and one for 2 a day.  Please advised on which is correct.  CB# [phone].

## 2018-06-09 NOTE — Telephone Encounter (Signed)
Spoke with the pharmacy and gave them the changes to the Nexium. Also gave them the correct instructions to the Metformin BID.

## 2018-06-09 NOTE — Telephone Encounter (Signed)
Pt called in to be advised. They received a Rx for esomeprazole (NEXIUM) 40 MG packet - they would like to know if it is okay to change the Rx from capsules to tablets per pt's request.   [phone]

## 2018-06-10 ENCOUNTER — Other Ambulatory Visit: Payer: Self-pay

## 2018-06-10 MED ORDER — VITAMIN D (ERGOCALCIFEROL) 1.25 MG (50000 UNIT) PO CAPS: 50000.0000 [IU] | ORAL_CAPSULE | ORAL | 0 refills | Status: DC

## 2018-06-10 MED ORDER — ESOMEPRAZOLE MAGNESIUM 40 MG PO CPDR: 40.0000 mg | DELAYED_RELEASE_CAPSULE | Freq: Every day | ORAL | 3 refills | Status: DC

## 2018-06-10 MED FILL — VIT D2 1.25 MG (50,000 UNIT: 1.25 MG | 84 days supply | Qty: 12 | Fill #0

## 2018-06-11 ENCOUNTER — Telehealth: Payer: Self-pay | Admitting: Obstetrics and Gynecology

## 2018-06-11 NOTE — Telephone Encounter (Addendum)
Voice mail box full: Called but could not leave a message for patient to call back to schedule a new patient doctor referral appointment with our office to see Dr. Oscar LaJertson for: polycystic ovarian syndrome.

## 2018-06-12 LAB — TESTOS,TOTAL,FREE AND SHBG (FEMALE)
Free Testosterone: 10.4 pg/mL — ABNORMAL HIGH (ref 0.1–6.4)
Sex Hormone Binding: 27 nmol/L (ref 17–124)
Testosterone, Total, LC-MS-MS: 67 ng/dL — ABNORMAL HIGH (ref 2–45)

## 2018-06-17 ENCOUNTER — Other Ambulatory Visit: Payer: Self-pay

## 2018-06-17 ENCOUNTER — Encounter

## 2018-06-17 ENCOUNTER — Ambulatory Visit (INDEPENDENT_AMBULATORY_CARE_PROVIDER_SITE_OTHER): Payer: No Typology Code available for payment source | Admitting: Obstetrics and Gynecology

## 2018-06-17 ENCOUNTER — Encounter: Payer: Self-pay | Admitting: Family Medicine

## 2018-06-17 ENCOUNTER — Encounter: Payer: Self-pay | Admitting: Obstetrics and Gynecology

## 2018-06-17 VITALS — BP 106/82 | HR 68 | Ht 64.0 in | Wt 267.2 lb

## 2018-06-17 DIAGNOSIS — L680 Hirsutism: Secondary | ICD-10-CM | POA: Diagnosis not present

## 2018-06-17 DIAGNOSIS — Q519 Congenital malformation of uterus and cervix, unspecified: Secondary | ICD-10-CM

## 2018-06-17 DIAGNOSIS — E282 Polycystic ovarian syndrome: Secondary | ICD-10-CM

## 2018-06-17 DIAGNOSIS — N912 Amenorrhea, unspecified: Secondary | ICD-10-CM | POA: Diagnosis not present

## 2018-06-17 LAB — POCT URINE PREGNANCY: Preg Test, Ur: NEGATIVE

## 2018-06-17 MED ORDER — MEDROXYPROGESTERONE ACETATE 10 MG PO TABS: 10.0000 mg | ORAL_TABLET | Freq: Every day | ORAL | 0 refills | Status: DC

## 2018-06-17 MED ORDER — MEDROXYPROGESTERONE ACETATE 5 MG PO TABS: ORAL_TABLET | ORAL | 1 refills | Status: DC

## 2018-06-17 MED FILL — MEDROXYPROGESTERONE 10 MG T: 10 | 10 days supply | Qty: 10 | Fill #0

## 2018-06-17 NOTE — Progress Notes (Addendum)
27 y.o. G0P0000 SingleCaucasianF here for consult from Dr Earlene PlaterWallace regarding PCOS. Elevated total testosterone at 67, elevated free testosterone at 10.4, normal TSH. She also had an elevated triglyceride of 199, LDL of 100. Normal HgbA1C. Period Pattern: (!) Irregular No cycle in 1 year. Menarche ~age 27, always irregular. Initially occurring about every 40 days, progressively got further apart. She was on OCP's from age 27 to [age]. On and off OCP's until age 27. She was having a lot of migraines, worse on OCP's. She has migraines with aura. Since she stopped the pill no migraines. No cycle in about a year. In the last few months she has had minimal spotting about every 10 days.  Sexually active, same partner x 4 years. Engaged, buying a house, wants to get pregnant soon.  She c/o increasing hirsutism in the last year. She is needing to shave, she is doing laser hair removal. Some hair on her chest and lower abdomen. No galactorrhea. No vasomotor symptoms.  She has some acne on her chin.   She was told she had a heart shaped uterus in the past.   No LMP recorded. (Menstrual status: Irregular Periods).  No cycle in 1 year Sexually active: Yes.    The current method of family planning is none.    Exercising: Yes.    at home exercise Smoker:  no  Health Maintenance: Pap:  07/11/2016 WNL History of abnormal Pap:  no TDaP:  07/01/2012 Gardasil: None   reports that she has never smoked. She has never used smokeless tobacco. She reports that she drinks alcohol. She reports that she does not use drugs. Rare ETOH. 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   Past Medical History:  Diagnosis Date  . Adenopathy, Left Cervical   . Amenorrhea   . GERD (gastroesophageal reflux disease)   . History of Epstein-Barr virus infection   . Migraines   . Morbid (severe) obesity due to excess calories (HCC) 02/03/2017  . PCOS (polycystic ovarian syndrome)   .  Pericarditis 11/11/2015  . Vitamin D deficiency 02/03/2017    Past Surgical History:  Procedure Laterality Date  . LYMPH NODE BIOPSY Left 12/11/2014   Procedure: LYMPH NODE BIOPSY;  Surgeon: Flo ShanksKarol Wolicki, MD;  Location:  [facility];  Service: ENT;  Laterality: Left;  . WISDOM TOOTH EXTRACTION      Current Outpatient Medications  Medication Sig Dispense Refill  . acetaminophen (TYLENOL) 325 MG tablet Take 650 mg by mouth every 6 (six) hours as needed.    Marland Kitchen. aspirin-acetaminophen-caffeine (EXCEDRIN MIGRAINE) 250-250-65 MG tablet Take by mouth every 6 (six) hours as needed for headache.    . Cyanocobalamin (VITAMIN B 12 PO) Place 2,500 mcg under the tongue.    Marland Kitchen. esomeprazole (NEXIUM) 40 MG capsule Take 1 capsule (40 mg total) by mouth daily. 30 capsule 3  . metFORMIN (GLUCOPHAGE XR) 750 MG 24 hr tablet Take 2 tablets (1,500 mg total) by mouth daily with breakfast. 60 tablet 3  . spironolactone (ALDACTONE) 50 MG tablet Take 1 tablet (50 mg total) by mouth daily. 30 tablet 3  . SUMAtriptan (IMITREX) 100 MG tablet Take 1/2 to 1 tablet at headache onset. May repeat in 2 hours if headache persists or recurs. 10 tablet 2  . Vitamin D, Ergocalciferol, (DRISDOL) 50000 units CAPS capsule Take 1 capsule (50,000 Units total) by mouth every 7 (seven) days. 12 capsule 0   No current facility-administered medications  for this visit.     Family History  Problem Relation Age of Onset  . Diabetes Mother   . Hypertension Mother   . Cancer Maternal Grandmother        Hodgkin's Lymphoma  . Diabetes Paternal Grandmother   . Heart disease Paternal Grandmother   . Heart disease Paternal Grandfather   . Polycystic ovary syndrome Paternal Aunt     Review of Systems  Constitutional: Negative.   HENT: Negative.   Eyes: Negative.   Respiratory: Negative.   Cardiovascular: Negative.   Gastrointestinal: Negative.   Endocrine: Negative.   Genitourinary:       Amenorrhea  Musculoskeletal:  Negative.   Skin: Negative.   Allergic/Immunologic: Negative.   Neurological: Negative.   Hematological: Negative.   Psychiatric/Behavioral: Negative.     Exam:   BP 106/82 (BP Location: Right Arm, Patient Position: Sitting)   Pulse 68   Ht 5\' 4"  (1.626 m)   Wt 267 lb 3.2 oz (121.2 kg)   BMI 45.86 kg/m   Weight change: @WEIGHTCHANGE @ Height:   Height: 5\' 4"  (162.6 cm)  Ht Readings from Last 3 Encounters:  06/17/18 5\' 4"  (1.626 m)  06/09/18 5\' 4"  (1.626 m)  05/17/18 5\' 4"  (1.626 m)    General appearance: alert, cooperative and appears stated age Head: Normocephalic, without obvious abnormality, atraumatic Skin: no significant acne, she has some hirsutism on her chin, breasts and lower abdomen.  Neck: no adenopathy, supple, symmetrical, trachea midline and thyroid normal to inspection and palpation Lungs: clear to auscultation bilaterally Cardiovascular: regular rate and rhythm Breasts: normal appearance, some mild hair growth around the areolar region. Palpation was not performed.  Abdomen: soft, non-tender; non distended,  no masses,  no organomegaly Extremities: extremities normal, atraumatic, no cyanosis or edema Skin: Skin color, texture, turgor normal. No rashes or lesions Lymph nodes: Cervical, supraclavicular, and axillary nodes normal. 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, will get a copy of the ultrasound   P:   Primary started her on metformin  I recommended she not take the spironolactone (script from Dr Earlene Plater for hirsutism) unless she is on contraception (tertatogenic)  Discussed that clomid or letrozole are better than metformin for ovulation induction if she is trying to get pregnant (she is not ready to do that yet)  We discussed the benefit of weight loss of PCOS and amenorrhea  She should be on PNV in case of  pregnancy  Provera now and every other month if no spontaneous cycles (knows to abstain x 2 weeks and check pregnancy test)  We discussed the risks of unopposed estrogen and the need for endometrial protection  Check FSH, estradiol, UPT, DHEAS, 17 hydroxyprogesterone  F/U in 3 months, calendar any bleeding.  CC: Dr Earlene Plater Note sent  06/30/18 addendum: Ultrasound report from 05/28/11 was reviewed (no images available) She was noted to have a retroverted uterus that appeared to be bicornuate and polycystic appearing ovaries. No renal imaging or 3D ultrasound was done.  I will recommend that she have a 3D ultrasound to better understand any anomaly and a renal ultrasound.

## 2018-06-17 NOTE — Patient Instructions (Addendum)
Polycystic Ovarian Syndrome Polycystic ovarian syndrome (PCOS) is a common hormonal disorder among women of reproductive age. In most women with PCOS, many small fluid-filled sacs (cysts) grow on the ovaries, and the cysts are not part of a normal menstrual cycle. PCOS can cause problems with your menstrual periods and make it difficult to get pregnant. It can also cause an increased risk of miscarriage with pregnancy. If it is not treated, PCOS can lead to serious health problems, such as diabetes and heart disease. What are the causes? The cause of PCOS is not known, but it may be the result of a combination of certain factors, such as:  Irregular menstrual cycle.  High levels of certain hormones (androgens).  Problems with the hormone that helps to control blood sugar (insulin resistance).  Certain genes.  What increases the risk? This condition is more likely to develop in women who have a family history of PCOS. What are the signs or symptoms? Symptoms of PCOS may include:  Multiple ovarian cysts.  Infrequent periods or no periods.  Periods that are too frequent or too heavy.  Unpredictable periods.  Inability to get pregnant (infertility) because of not ovulating.  Increased growth of hair on the face, chest, stomach, back, thumbs, thighs, or toes.  Acne or oily skin. Acne may develop during adulthood, and it may not respond to treatment.  Pelvic pain.  Weight gain or obesity.  Patches of thickened and dark brown or black skin on the neck, arms, breasts, or thighs (acanthosis nigricans).  Excess hair growth on the face, chest, abdomen, or upper thighs (hirsutism).  How is this diagnosed? This condition is diagnosed based on:  Your medical history.  A physical exam, including a pelvic exam. Your health care provider may look for areas of increased hair growth on your skin.  Tests, such as: ? Ultrasound. This may be used to examine the ovaries and the lining of the  uterus (endometrium) for cysts. ? Blood tests. These may be used to check levels of sugar (glucose), female hormone (testosterone), and female hormones (estrogen and progesterone) in your blood.  How is this treated? There is no cure for PCOS, but treatment can help to manage symptoms and prevent more health problems from developing. Treatment varies depending on:  Your symptoms.  Whether you want to have a baby or whether you need birth control (contraception).  Treatment may include nutrition and lifestyle changes along with:  Progesterone hormone to start a menstrual period.  Birth control pills to help you have regular menstrual periods.  Medicines to make you ovulate, if you want to get pregnant.  Medicine to reduce excessive hair growth.  Surgery, in severe cases. This may involve making small holes in one or both of your ovaries. This decreases the amount of testosterone that your body produces.  Follow these instructions at home:  Take over-the-counter and prescription medicines only as told by your health care provider.  Follow a healthy meal plan. This can help you reduce the effects of PCOS. ? Eat a healthy diet that includes lean proteins, complex carbohydrates, fresh fruits and vegetables, low-fat dairy products, and healthy fats. Make sure to eat enough fiber.  If you are overweight, lose weight as told by your health care provider. ? Losing 10% of your body weight may improve symptoms. ? Your health care provider can determine how much weight loss is best for you and can help you lose weight safely.  Keep all follow-up visits as told by   your health care provider. This is important. Contact a health care provider if:  Your symptoms do not get better with medicine.  You develop new symptoms. This information is not intended to replace advice given to you by your health care provider. Make sure you discuss any questions you have with your health care  provider. Document Released: 02/20/2005 Document Revised: 06/24/2016 Document Reviewed: 04/13/2016 Elsevier Interactive Patient Education  2018 [employer]   Take provera 10 mg x 10 days now. Call with or without a cycle. Call with heavy or prolonged bleeding  Then take provera 5 mg x 5 days every other month if no spontaneous menses. You must not have unprotected intercourse for 2 weeks prior to taking the provera and have a negative UPT.

## 2018-06-20 ENCOUNTER — Encounter: Payer: Self-pay | Admitting: Obstetrics and Gynecology

## 2018-06-20 ENCOUNTER — Encounter: Payer: Self-pay | Admitting: Family Medicine

## 2018-06-20 LAB — FOLLICLE STIMULATING HORMONE: FSH: 2 m[IU]/mL

## 2018-06-20 LAB — ESTRADIOL: Estradiol: 96.4 pg/mL

## 2018-06-20 LAB — 17-HYDROXYPROGESTERONE: 17-Hydroxyprogesterone: 140 ng/dL

## 2018-06-20 LAB — DHEA-SULFATE: DHEA-SO4: 323 ug/dL (ref 84.8–378.0)

## 2018-06-20 LAB — PROLACTIN: Prolactin: 10.3 ng/mL (ref 4.8–23.3)

## 2018-06-21 ENCOUNTER — Other Ambulatory Visit: Payer: Self-pay | Admitting: Surgical

## 2018-06-21 MED ORDER — VITAFOL FE+ 90-1-200 & 50 MG PO CPPK: 1.0000 | ORAL_CAPSULE | Freq: Every day | ORAL | 2 refills | Status: DC

## 2018-06-25 ENCOUNTER — Encounter

## 2018-06-30 ENCOUNTER — Telehealth: Payer: Self-pay | Admitting: Obstetrics and Gynecology

## 2018-06-30 NOTE — Telephone Encounter (Signed)
Ultrasound report from 05/28/11 was reviewed (no images available) She was noted to have a retroverted uterus that appeared to be bicornuate and polycystic appearing ovaries. No renal imaging or 3D ultrasound was done.  I would recommend that she return for a 3d ultrasound of her uterus and evaluation of her kidneys. Please speak to her about this and set her up if she is in agreement. Typically Bo MerinoGeri or Jasmine DecemberSharon will do the renal evaluation here.

## 2018-06-30 NOTE — Telephone Encounter (Signed)
Left message to call Kaitlyn at [phone]. 

## 2018-07-01 NOTE — Telephone Encounter (Signed)
She doesn't have to have the ultrasound, but it will better determine the significance of her uterine anomaly. The uterus and kidney's develop at the same time, so there is an increase in renal abnormalities in women with uterine anomalies. The recommendation is to evaluate the renal system in women with uterine anomalies. The type of uterine anomaly can change her risks with pregnancy. She doesn't have to have imaging prior to getting pregnant, but it is easier to evaluate when she isn't pregnant.

## 2018-07-01 NOTE — Telephone Encounter (Signed)
Spoke with patient. Patient is asking why she needs to have this done at this time. Asking why she needs to have kidney imaging performed and how this relates to everything. Advised will review with Dr.Jertson and return call.

## 2018-07-01 NOTE — Telephone Encounter (Signed)
Patient left voicemail returning call to Kaitlyn.  °

## 2018-07-01 NOTE — Telephone Encounter (Signed)
Spoke with patient. Advised of message as seen below from Dr.Jertson. Patient verbalizes understanding. Patient will return call to schedule ultrasound. Wants to wait until December due to work schedule. Will call if she would like to schedule earlier.  Routing to provider for final review. Patient agreeable to disposition. Will close encounter.

## 2018-07-17 ENCOUNTER — Encounter: Payer: Self-pay | Admitting: Obstetrics and Gynecology

## 2018-07-19 ENCOUNTER — Telehealth: Payer: Self-pay | Admitting: Obstetrics and Gynecology

## 2018-07-19 NOTE — Telephone Encounter (Signed)
Patient returned call to the nurse.  

## 2018-07-19 NOTE — Telephone Encounter (Signed)
Spoke with patient. Patient took provera 10mg  daily x 10 days for amenorrhea, menses started on 8/23. Reports bleeding started again on 9/7, flow is light to spotting, "feels like cycle is starting again". Denies pain or any other GYN symptoms.   Recommended patient calendar bleeding, call if bleeding becomes heavy or any new symptoms develop. Provera 5mg  daily x5 days, q other mo if no menses. Keep 3 mo f/u with Dr. Reyne Dumas for 09/20/18.   Advised I will review with Dr. Oscar La and return call with any additional recommendations. Patient request detailed message on mobile number if no answer  Dr. Oscar La -please review.

## 2018-07-19 NOTE — Telephone Encounter (Signed)
Hi Dr. Oscar La,     I wanted to let you know I did have a period aug 23-27th. I only had 1 day that was heavy but not unmanageable. I am emailing you because today I am having some medium/heavy spotting. Is this normal?

## 2018-07-19 NOTE — Telephone Encounter (Signed)
Patient is returning a call to Triage °

## 2018-07-19 NOTE — Telephone Encounter (Signed)
Message left to return call to Tracy at [phone].   Mychart message sent as well.   

## 2018-07-21 NOTE — Telephone Encounter (Signed)
It is possible the patient is having a cycle on her own, the metformin could be contributing to this. The provera won't prevent her from ovulating or having a cycle on her own, it just empties out the uterus. I agree with using the provera every other month if no spontaneous cycles. Calendar cycles and f/u in 11/19 as previously discussed.

## 2018-07-22 NOTE — Telephone Encounter (Signed)
Left detailed message, ok per dpr. Advised as seen below per Dr. Oscar LaJertson. Return call to office if any additional questions/concerns.   Encounter closed.

## 2018-09-16 ENCOUNTER — Encounter: Payer: Self-pay | Admitting: Surgical

## 2018-09-16 NOTE — Progress Notes (Signed)
GYNECOLOGY  VISIT   HPI: 27 y.o.   Single White or Caucasian Not Hispanic or Latino  female   G0P0000 with No LMP recorded. (Menstrual status: Irregular Periods).   here for 3 month recheck after taking Provera for amenorrhea. Took Provera for 10 days, cycle then started a few days after on 07/02/2018 that lasted 5 days, not heavy, normal cycle. 9/6 -9/8 light spotting. No bleeding since. The patient was evaluated earlier this year for PCOS and amenorrhea. She had elevated testosterone levels, c/w PCOS, normal prolactin, FSH of 2, estradiol of 96.4, normal TSH.  She is trying to get healthy, does want to get pregnant next year. Not using contraception, but not actively trying. She is on prenatal vitamins. She is trying to eat healthier, eating paleo, cooking more at home. No soda's or sweet tea. Rare ETOH.  She isn't exercising. Always on the run, in school and working full time. She tried phentermine with Dr Earlene Plater, had an allergic reaction.      GYNECOLOGIC HISTORY: No LMP recorded. (Menstrual status: Irregular Periods). Contraception: None Menopausal hormone therapy: None        OB History    Gravida  0   Para  0   Term  0   Preterm  0   AB  0   Living  0     SAB  0   TAB  0   Ectopic  0   Multiple  0   Live Births  0              Patient Active Problem List   Diagnosis Date Noted  . Prediabetes 02/07/2017  . Migraines   . GERD (gastroesophageal reflux disease)   . Polycystic ovarian syndrome 02/03/2017  . Vitamin D deficiency 02/03/2017  . Morbid (severe) obesity due to excess calories (HCC) 02/03/2017    Past Medical History:  Diagnosis Date  . Adenopathy, Left Cervical   . Amenorrhea   . GERD (gastroesophageal reflux disease)   . History of Epstein-Barr virus infection   . Migraines   . Morbid (severe) obesity due to excess calories (HCC) 02/03/2017  . PCOS (polycystic ovarian syndrome)   . Pericarditis 11/11/2015  . Vitamin D deficiency  02/03/2017    Past Surgical History:  Procedure Laterality Date  . LYMPH NODE BIOPSY Left 12/11/2014   Procedure: LYMPH NODE BIOPSY;  Surgeon: Flo Shanks, MD;  Location: [facility];  Service: ENT;  Laterality: Left;  . WISDOM TOOTH EXTRACTION      Current Outpatient Medications  Medication Sig Dispense Refill  . acetaminophen (TYLENOL) 325 MG tablet Take 650 mg by mouth every 6 (six) hours as needed.    Marland Kitchen aspirin-acetaminophen-caffeine (EXCEDRIN MIGRAINE) 250-250-65 MG tablet Take by mouth every 6 (six) hours as needed for headache.    . Cyanocobalamin (VITAMIN B 12 PO) Take by mouth.    . esomeprazole (NEXIUM) 40 MG capsule Take 1 capsule (40 mg total) by mouth daily. 30 capsule 3  . medroxyPROGESTERone (PROVERA) 5 MG tablet Take one tablet qd x 5 days every other month if no spontaneous menses. Shouldn't start prior to October. 15 tablet 1  . Prenat-FePoly-Metf-FA-DHA-DSS (VITAFOL FE+) 90-1-200 & 50 MG CPPK Take 1 tablet by mouth daily. 30 each 2  . SUMAtriptan (IMITREX) 100 MG tablet Take 1/2 to 1 tablet at headache onset. May repeat in 2 hours if headache persists or recurs. 10 tablet 2  . medroxyPROGESTERone (PROVERA) 10 MG tablet Take 1 tablet (  10 mg total) by mouth daily. (Patient not taking: Reported on 09/20/2018) 10 tablet 0  . metFORMIN (GLUCOPHAGE XR) 750 MG 24 hr tablet Take 2 tablets (1,500 mg total) by mouth daily with breakfast. (Patient not taking: Reported on 09/20/2018) 60 tablet 3  . spironolactone (ALDACTONE) 50 MG tablet Take 1 tablet (50 mg total) by mouth daily. (Patient not taking: Reported on 09/20/2018) 30 tablet 3   No current facility-administered medications for this visit.      ALLERGIES: Colchicine; Indomethacin; and Phentermine  Family History  Problem Relation Age of Onset  . Diabetes Mother   . Hypertension Mother   . Cancer Maternal Grandmother        Hodgkin's Lymphoma  . Diabetes Paternal Grandmother   . Heart disease Paternal  Grandmother   . Heart disease Paternal Grandfather   . Polycystic ovary syndrome Paternal Aunt     Social History   Socioeconomic History  . Marital status: Single    Spouse name: Not on file  . Number of children: Not on file  . Years of education: Not on file  . Highest education level: Not on file  Occupational History    Employer: [employer]  Social Needs  . Financial resource strain: Not on file  . Food insecurity:    Worry: Not on file    Inability: Not on file  . Transportation needs:    Medical: Not on file    Non-medical: Not on file  Tobacco Use  . Smoking status: Never Smoker  . Smokeless tobacco: Never Used  Substance and Sexual Activity  . Alcohol use: Yes    Alcohol/week: 0.0 standard drinks    Comment: social  . Drug use: No  . Sexual activity: Yes    Birth control/protection: None  Lifestyle  . Physical activity:    Days per week: Not on file    Minutes per session: Not on file  . Stress: Not on file  Relationships  . Social connections:    Talks on phone: Not on file    Gets together: Not on file    Attends religious service: Not on file    Active member of club or organization: Not on file    Attends meetings of clubs or organizations: Not on file    Relationship status: Not on file  . Intimate partner violence:    Fear of current or ex partner: Not on file    Emotionally abused: Not on file    Physically abused: Not on file    Forced sexual activity: Not on file  Other Topics Concern  . Not on file  Social History Narrative  . Not on file    Review of Systems  Constitutional:       Weight gain  HENT: Negative.   Eyes: Negative.   Respiratory: Negative.   Cardiovascular: Negative.   Gastrointestinal: Negative.   Genitourinary:       Irregular bleeding  Musculoskeletal: Negative.   Skin: Negative.   Neurological: Negative.   Endo/Heme/Allergies: Negative.   Psychiatric/Behavioral: Negative.     PHYSICAL EXAMINATION:    BP  114/72 (BP Location: Right Arm, Patient Position: Sitting, Cuff Size: Normal)   Pulse 60   Wt 271 lb 12.8 oz (123.3 kg)   BMI 46.65 kg/m     General appearance: alert, cooperative and appears stated age  ASSESSMENT PCOS Weight gain, BMI 46 Amenorrhea    PLAN UPT negative Provera 5 mg x 5 days every other day  if no spontaneous menses, she knows to make sure she isn't pregnant prior to taking it Calendar all bleeding, call with any concerns Given handouts on [facility] She has a h/o pre-diabetes and should discuss this further with Dr Earlene Plater. Weight loss will help   An After Visit Summary was printed and given to the patient.  ~15 minutes face to face time of which over 50% was spent in counseling.

## 2018-09-20 ENCOUNTER — Encounter: Payer: Self-pay | Admitting: Obstetrics and Gynecology

## 2018-09-20 ENCOUNTER — Ambulatory Visit (INDEPENDENT_AMBULATORY_CARE_PROVIDER_SITE_OTHER): Payer: No Typology Code available for payment source | Admitting: Obstetrics and Gynecology

## 2018-09-20 ENCOUNTER — Other Ambulatory Visit: Payer: Self-pay

## 2018-09-20 VITALS — BP 114/72 | HR 60 | Wt 271.8 lb

## 2018-09-20 DIAGNOSIS — Z6841 Body Mass Index (BMI) 40.0 and over, adult: Secondary | ICD-10-CM

## 2018-09-20 DIAGNOSIS — N914 Secondary oligomenorrhea: Secondary | ICD-10-CM

## 2018-09-20 DIAGNOSIS — E282 Polycystic ovarian syndrome: Secondary | ICD-10-CM

## 2018-09-20 MED ORDER — MEDROXYPROGESTERONE ACETATE 5 MG PO TABS: ORAL_TABLET | ORAL | 1 refills | Status: DC

## 2018-09-20 MED FILL — MEDROXYPROGESTERONE 5 MG TA: 5 | 60 days supply | Qty: 5 | Fill #0

## 2018-09-23 ENCOUNTER — Ambulatory Visit: Payer: No Typology Code available for payment source | Admitting: Obstetrics and Gynecology

## 2018-12-12 ENCOUNTER — Ambulatory Visit (HOSPITAL_COMMUNITY)
Admission: EM | Admit: 2018-12-12 | Discharge: 2018-12-12 | Disposition: A | Discharge status: Home / Self Care | Payer: No Typology Code available for payment source | Attending: Family Medicine | Admitting: Family Medicine

## 2018-12-12 ENCOUNTER — Encounter (HOSPITAL_COMMUNITY): Payer: Self-pay | Admitting: Emergency Medicine

## 2018-12-12 DIAGNOSIS — R6889 Other general symptoms and signs: Secondary | ICD-10-CM | POA: Diagnosis not present

## 2018-12-12 MED ORDER — BENZONATATE 100 MG PO CAPS: 100.0000 mg | ORAL_CAPSULE | Freq: Three times a day (TID) | ORAL | 0 refills | Status: DC

## 2018-12-12 NOTE — Discharge Instructions (Signed)
Declines tamiflu at this time.  Would like to try supportive care Get plenty of rest and push fluids.  Drink at least half your body weight in ounces.  You may supplement with OTC Pedialyte or oral rehydration solution Tessalon Perles prescribed for cough Use OTC tylenol every 4 hours for fever.   Follow up with PCP if symptoms persist Go to the ED if you have any new or worsening symptoms fever that does not moderate with tylenol, chills, nausea, vomiting, chest pain, worsening cough, shortness of breath, wheezing, abdominal pain, changes in bowel or bladder habits, etc..Marland Kitchen

## 2018-12-12 NOTE — ED Provider Notes (Signed)
[facility]   [id number] 12/12/18 Arrival Time: 1733   CC: flu like symptoms   SUBJECTIVE: History from: patient.  Jacqueline Spryicole L [name] is a 28 y.o. female who presents with abrupt onset of cough, fever, body aches, and chills x 2 days.  Tmax of 102 at home.  Denies known sick exposure or precipitating event.  Has tried OTC medications with relief.  Reports previous symptoms in the past.   Complains of associated nausea.  Denies sinus pain, rhinorrhea, sore throat, SOB, wheezing, chest pain, nausea, changes in bowel or bladder habits.    Received flu shot this year: no.  ROS: As per HPI.  Past Medical History:  Diagnosis Date  . Adenopathy, Left Cervical   . Amenorrhea   . GERD (gastroesophageal reflux disease)   . History of Epstein-Barr virus infection   . Migraines   . Morbid (severe) obesity due to excess calories (HCC) 02/03/2017  . PCOS (polycystic ovarian syndrome)   . Pericarditis 11/11/2015  . Vitamin D deficiency 02/03/2017   Past Surgical History:  Procedure Laterality Date  . LYMPH NODE BIOPSY Left 12/11/2014   Procedure: LYMPH NODE BIOPSY;  Surgeon: Flo ShanksKarol Wolicki, MD;  Location: [facility];  Service: ENT;  Laterality: Left;  . WISDOM TOOTH EXTRACTION     Allergies  Allergen Reactions  . Colchicine Diarrhea and Other (See Comments)    DIARRHEA AND MOUTH SORES  . Indomethacin Other (See Comments)    Blood in stool   . Phentermine Rash   No current facility-administered medications on file prior to encounter.    No current outpatient medications on file prior to encounter.   Social History   Socioeconomic History  . Marital status: Single    Spouse name: Not on file  . Number of children: Not on file  . Years of education: Not on file  . Highest education level: Not on file  Occupational History    Employer: [employer]  Social Needs  . Financial resource strain: Not on file  . Food insecurity:    Worry: Not on file   Inability: Not on file  . Transportation needs:    Medical: Not on file    Non-medical: Not on file  Tobacco Use  . Smoking status: Never Smoker  . Smokeless tobacco: Never Used  Substance and Sexual Activity  . Alcohol use: Yes    Alcohol/week: 0.0 standard drinks    Comment: social  . Drug use: No  . Sexual activity: Yes    Birth control/protection: None  Lifestyle  . Physical activity:    Days per week: Not on file    Minutes per session: Not on file  . Stress: Not on file  Relationships  . Social connections:    Talks on phone: Not on file    Gets together: Not on file    Attends religious service: Not on file    Active member of club or organization: Not on file    Attends meetings of clubs or organizations: Not on file    Relationship status: Not on file  . Intimate partner violence:    Fear of current or ex partner: Not on file    Emotionally abused: Not on file    Physically abused: Not on file    Forced sexual activity: Not on file  Other Topics Concern  . Not on file  Social History Narrative  . Not on file   Family History  Problem Relation Age of Onset  .  Diabetes Mother   . Hypertension Mother   . Cancer Maternal Grandmother        Hodgkin's Lymphoma  . Diabetes Paternal Grandmother   . Heart disease Paternal Grandmother   . Heart disease Paternal Grandfather   . Polycystic ovary syndrome Paternal Aunt     OBJECTIVE:  Vitals:   12/12/18 1800  BP: (!) 142/87  Pulse: (!) 136  Resp: 18  Temp: (!) 101.3 F (38.5 C)  TempSrc: Temporal  SpO2: 97%     General appearance: alert; appears fatigued, but nontoxic; speaking in full sentences and tolerating own secretions HEENT: NCAT; Ears: EACs clear, TMs pearly gray; Eyes: PERRL.  EOM grossly intact. Nose: nares patent without rhinorrhea, Throat: oropharynx clear, tonsils non erythematous or enlarged, uvula midline  Neck: supple without LAD Lungs: unlabored respirations, symmetrical air entry; cough:  mild; no respiratory distress; CTAB Heart: Tachycardic.   Skin: warm and dry Psychological: alert and cooperative; normal mood and affect  ASSESSMENT & PLAN:  1. Flu-like symptoms     Meds ordered this encounter  Medications  . benzonatate (TESSALON) 100 MG capsule    Sig: Take 1 capsule (100 mg total) by mouth every 8 (eight) hours.    Dispense:  21 capsule    Refill:  0    Order Specific Question:   Supervising Provider    Answer:   Eustace Moore [[id number]]   Declines tamiflu at this time.  Would like to try supportive care Get plenty of rest and push fluids.  Drink at least half your body weight in ounces.  You may supplement with OTC Pedialyte or oral rehydration solution Tessalon Perles prescribed for cough Alternate ibuprofen and tylenol every 4-6 hours as needed fever, body aches, and chills Follow up with PCP if symptoms persist Go to the ED if you have any new or worsening symptoms fever that does not moderate with tylenol, chills, nausea, vomiting, chest pain, worsening cough, shortness of breath, wheezing, abdominal pain, changes in bowel or bladder habits, etc...   Reviewed expectations re: course of current medical issues. Questions answered. Outlined signs and symptoms indicating need for more acute intervention. Patient verbalized understanding. After Visit Summary given.         Rennis Harding, PA-C 12/12/18 [id number]

## 2018-12-12 NOTE — ED Triage Notes (Signed)
Pt c/o flu like symptoms, had tylenol 1 hour pta. Symptoms since Friday.

## 2018-12-14 ENCOUNTER — Ambulatory Visit (INDEPENDENT_AMBULATORY_CARE_PROVIDER_SITE_OTHER): Payer: No Typology Code available for payment source | Admitting: Physician Assistant

## 2018-12-14 ENCOUNTER — Encounter: Payer: Self-pay | Admitting: Physician Assistant

## 2018-12-14 VITALS — BP 110/72 | HR 86 | Temp 98.2°F | Ht 64.0 in | Wt 268.4 lb

## 2018-12-14 DIAGNOSIS — H6690 Otitis media, unspecified, unspecified ear: Secondary | ICD-10-CM | POA: Diagnosis not present

## 2018-12-14 MED ORDER — AMOXICILLIN 875 MG PO TABS: 875.0000 mg | ORAL_TABLET | Freq: Two times a day (BID) | ORAL | 0 refills | Status: DC

## 2018-12-14 MED FILL — AMOXICILLIN 875 MG TABS: 875 | 10 days supply | Qty: 20 | Fill #0

## 2018-12-14 NOTE — Patient Instructions (Signed)
It was great to see you!  Start oral amoxicillin for your ear infection and throat.  If you continue taking ibuprofen daily, consider an antacid medication (such as prilosec) to help prevent further stomach upset.  Push fluids and get plenty of rest. Please return if you are not improving as expected, or if you have high fevers (>101.5) or difficulty swallowing or worsening productive cough.  Call clinic with questions.  I hope you start feeling better soon!

## 2018-12-14 NOTE — Progress Notes (Signed)
Jacqueline [name] is a 28 y.o. female here for a new problem.  I acted as a Neurosurgeonscribe for Energy East CorporationSamantha , PA-C Jacqueline Mullonna Orphanos, LPN  History of Present Illness:   Chief Complaint  Patient presents with  . Cough    Cough  This is a recurrent problem. Episode onset: Pt seen at Urgent Care on 2/2. The problem has been gradually worsening. The problem occurs constantly. The cough is productive of sputum (expectorating white/yellow). Associated symptoms include chills, a fever (102.8 on Sunday, now low grade), nasal congestion, postnasal drip and a sore throat. Pertinent negatives include no shortness of breath. Associated symptoms comments: Chest congestion, body aches. The symptoms are aggravated by lying down. Treatments tried: Dayquil, Ibuprofen. The treatment provided mild relief. There is no history of asthma, bronchitis or pneumonia.   At Urgent Care on 12/12/18 was told that she likely had the flu. She declined Tamiflu.  Has had worsening sore throat and ear pressure since. Also has worsening cough.  Past Medical History:  Diagnosis Date  . Adenopathy, Left Cervical   . Amenorrhea   . GERD (gastroesophageal reflux disease)   . History of Epstein-Barr virus infection   . Migraines   . Morbid (severe) obesity due to excess calories (HCC) 02/03/2017  . PCOS (polycystic ovarian syndrome)   . Pericarditis 11/11/2015  . Vitamin D deficiency 02/03/2017     Social History   Socioeconomic History  . Marital status: Single    Spouse name: Not on file  . Number of children: Not on file  . Years of education: Not on file  . Highest education level: Not on file  Occupational History    Employer: [employer]  Social Needs  . Financial resource strain: Not on file  . Food insecurity:    Worry: Not on file    Inability: Not on file  . Transportation needs:    Medical: Not on file    Non-medical: Not on file  Tobacco Use  . Smoking status: Never Smoker  . Smokeless tobacco: Never Used   Substance and Sexual Activity  . Alcohol use: Yes    Alcohol/week: 0.0 standard drinks    Comment: social  . Drug use: No  . Sexual activity: Yes    Birth control/protection: None  Lifestyle  . Physical activity:    Days per week: Not on file    Minutes per session: Not on file  . Stress: Not on file  Relationships  . Social connections:    Talks on phone: Not on file    Gets together: Not on file    Attends religious service: Not on file    Active member of club or organization: Not on file    Attends meetings of clubs or organizations: Not on file    Relationship status: Not on file  . Intimate partner violence:    Fear of current or ex partner: Not on file    Emotionally abused: Not on file    Physically abused: Not on file    Forced sexual activity: Not on file  Other Topics Concern  . Not on file  Social History Narrative  . Not on file    Past Surgical History:  Procedure Laterality Date  . LYMPH NODE BIOPSY Left 12/11/2014   Procedure: LYMPH NODE BIOPSY;  Surgeon: Flo ShanksKarol Wolicki, MD;  Location: [facility];  Service: ENT;  Laterality: Left;  . WISDOM TOOTH EXTRACTION      Family History  Problem Relation  Age of Onset  . Diabetes Mother   . Hypertension Mother   . Cancer Maternal Grandmother        Hodgkin's Lymphoma  . Diabetes Paternal Grandmother   . Heart disease Paternal Grandmother   . Heart disease Paternal Grandfather   . Polycystic ovary syndrome Paternal Aunt     Allergies  Allergen Reactions  . Colchicine Diarrhea and Other (See Comments)    DIARRHEA AND MOUTH SORES  . Indomethacin Other (See Comments)    Blood in stool   . Phentermine Rash    Current Medications:   Current Outpatient Medications:  .  amoxicillin (AMOXIL) 875 MG tablet, Take 1 tablet (875 mg total) by mouth 2 (two) times daily., Disp: 20 tablet, Rfl: 0   Review of Systems:   Review of Systems  Constitutional: Positive for chills and fever (102.8 on  Sunday, now low grade).  HENT: Positive for postnasal drip and sore throat.   Respiratory: Positive for cough. Negative for shortness of breath.     Vitals:   Vitals:   12/14/18 1035  BP: 110/72  Pulse: 86  Temp: 98.2 F (36.8 C)  TempSrc: Oral  SpO2: 96%  Weight: 268 lb 6.1 oz (121.7 kg)  Height: 5\' 4"  (1.626 m)     Body mass index is 46.07 kg/m.  Physical Exam:   Physical Exam Vitals signs and nursing note reviewed.  Constitutional:      General: She is not in acute distress.    Appearance: She is well-developed. She is not ill-appearing or toxic-appearing.  HENT:     Head: Normocephalic and atraumatic.     Right Ear: Ear canal and external ear normal. Tympanic membrane is erythematous and bulging (mild). Tympanic membrane is not retracted.     Left Ear: Tympanic membrane, ear canal and external ear normal. Tympanic membrane is not erythematous, retracted or bulging.     Nose: Nose normal.     Right Sinus: No maxillary sinus tenderness or frontal sinus tenderness.     Left Sinus: No maxillary sinus tenderness or frontal sinus tenderness.     Mouth/Throat:     Lips: Pink.     Mouth: Mucous membranes are moist.     Pharynx: Uvula midline. Posterior oropharyngeal erythema present.     Tonsils: No tonsillar exudate. Swelling: 1+ on the right. 1+ on the left.  Eyes:     General: Lids are normal.     Conjunctiva/sclera: Conjunctivae normal.  Neck:     Trachea: Trachea normal.  Cardiovascular:     Rate and Rhythm: Normal rate and regular rhythm.     Heart sounds: Normal heart sounds, S1 normal and S2 normal.  Pulmonary:     Effort: Pulmonary effort is normal.     Breath sounds: Normal breath sounds. No decreased breath sounds, wheezing, rhonchi or rales.  Lymphadenopathy:     Cervical: No cervical adenopathy.  Skin:    General: Skin is warm and dry.  Neurological:     Mental Status: She is alert.  Psychiatric:        Speech: Speech normal.        Behavior:  Behavior normal. Behavior is cooperative.      Assessment and Plan:   Jacqueline [name] was seen today for cough.  Diagnoses and all orders for this visit:  Acute otitis media, unspecified otitis media type No red flags on exam.  Will initiate amoxicillin per orders. I also offered to swab for strep but she declined since  it wouldn't change treatment. Discussed taking medications as prescribed. Reviewed return precautions including worsening fever, SOB, worsening cough or other concerns. Push fluids and rest. I recommend that patient follow-up if symptoms worsen or persist despite treatment x 7-10 days, sooner if needed.  Other orders -     amoxicillin (AMOXIL) 875 MG tablet; Take 1 tablet (875 mg total) by mouth 2 (two) times daily.   . Reviewed expectations re: course of current medical issues. . Discussed self-management of symptoms. . Outlined signs and symptoms indicating need for more acute intervention. . Patient verbalized understanding and all questions were answered. . See orders for this visit as documented in the electronic medical record. . Patient received an After-Visit Summary.  CMA or LPN served as scribe during this visit. History, Physical, and Plan performed by medical provider. The above documentation has been reviewed and is accurate and complete.   Jarold Motto, PA-C

## 2019-02-04 MED FILL — ESOMEPRAZOLE MAG DR 40 MG C: 40 | 30 days supply | Qty: 30 | Fill #1

## 2020-09-06 DIAGNOSIS — Q519 Congenital malformation of uterus and cervix, unspecified: Secondary | ICD-10-CM | POA: Insufficient documentation

## 2020-09-22 ENCOUNTER — Other Ambulatory Visit: Payer: Self-pay

## 2020-09-22 ENCOUNTER — Encounter (HOSPITAL_COMMUNITY): Payer: Self-pay | Admitting: Emergency Medicine

## 2020-09-22 ENCOUNTER — Emergency Department (HOSPITAL_COMMUNITY)
Admission: EM | Admit: 2020-09-22 | Discharge: 2020-09-22 | Disposition: A | Discharge status: Home / Self Care | Payer: PRIVATE HEALTH INSURANCE | Attending: Emergency Medicine | Admitting: Emergency Medicine

## 2020-09-22 DIAGNOSIS — N23 Unspecified renal colic: Secondary | ICD-10-CM | POA: Diagnosis not present

## 2020-09-22 DIAGNOSIS — R109 Unspecified abdominal pain: Secondary | ICD-10-CM | POA: Diagnosis present

## 2020-09-22 LAB — BASIC METABOLIC PANEL
Anion gap: 11 (ref 5–15)
BUN: 11 mg/dL (ref 6–20)
CO2: 22 mmol/L (ref 22–32)
Calcium: 9.5 mg/dL (ref 8.9–10.3)
Chloride: 104 mmol/L (ref 98–111)
Creatinine, Ser: 0.91 mg/dL (ref 0.44–1.00)
GFR, Estimated: >60 mL/min (ref >60)
Glucose, Bld: 132 mg/dL — ABNORMAL HIGH (ref 70–99)
Potassium: 3.9 mmol/L (ref 3.5–5.1)
Sodium: 137 mmol/L (ref 135–145)

## 2020-09-22 LAB — CBC WITH DIFFERENTIAL/PLATELET
Abs Immature Granulocytes: 0.06 10*3/uL (ref 0.00–0.07)
Basophils Absolute: 0.1 10*3/uL (ref 0.0–0.1)
Basophils Relative: 1 %
Eosinophils Absolute: 0 10*3/uL (ref 0.0–0.5)
Eosinophils Relative: 0 %
HCT: 43.1 % (ref 36.0–46.0)
Hemoglobin: 14 g/dL (ref 12.0–15.0)
Immature Granulocytes: 1 %
Lymphocytes Relative: 19 %
Lymphs Abs: 1.9 10*3/uL (ref 0.7–4.0)
MCH: 28.6 pg (ref 26.0–34.0)
MCHC: 32.5 g/dL (ref 30.0–36.0)
MCV: 88 fL (ref 80.0–100.0)
Monocytes Absolute: 0.4 10*3/uL (ref 0.1–1.0)
Monocytes Relative: 4 %
Neutro Abs: 7.7 10*3/uL (ref 1.7–7.7)
Neutrophils Relative %: 75 %
Platelets: 280 10*3/uL (ref 150–400)
RBC: 4.9 MIL/uL (ref 3.87–5.11)
RDW: 13.2 % (ref 11.5–15.5)
WBC: 10 10*3/uL (ref 4.0–10.5)
nRBC: 0 % (ref 0.0–0.2)

## 2020-09-22 LAB — URINALYSIS, ROUTINE W REFLEX MICROSCOPIC
Bacteria, UA: NONE SEEN
Bilirubin Urine: NEGATIVE
Glucose, UA: NEGATIVE mg/dL
Ketones, ur: 5 mg/dL — AB
Leukocytes,Ua: NEGATIVE
Nitrite: NEGATIVE
Protein, ur: 30 mg/dL — AB
RBC / HPF: >50 RBC/hpf — ABNORMAL HIGH (ref 0–5)
Specific Gravity, Urine: 1.034 — ABNORMAL HIGH (ref 1.005–1.030)
pH: 5 (ref 5.0–8.0)

## 2020-09-22 LAB — I-STAT BETA HCG BLOOD, ED (MC, WL, AP ONLY): I-stat hCG, quantitative: <5 m[IU]/mL (ref <5)

## 2020-09-22 MED ORDER — KETOROLAC TROMETHAMINE 60 MG/2ML IM SOLN
30.0000 mg | Freq: Once | INTRAMUSCULAR | Status: AC
  Administered 2020-09-22: 30 mg via INTRAMUSCULAR
  Filled 2020-09-22: qty 2

## 2020-09-22 MED ORDER — TAMSULOSIN HCL 0.4 MG PO CAPS: 0.4000 mg | ORAL_CAPSULE | Freq: Every day | ORAL | 0 refills | Status: AC

## 2020-09-22 MED ORDER — ONDANSETRON 4 MG PO TBDP
4.0000 mg | ORAL_TABLET | Freq: Once | ORAL | Status: AC
  Administered 2020-09-22: 4 mg via ORAL
  Filled 2020-09-22: qty 1

## 2020-09-22 MED ORDER — ONDANSETRON 4 MG PO TBDP: 4.0000 mg | ORAL_TABLET | Freq: Three times a day (TID) | ORAL | 0 refills | Status: AC | PRN

## 2020-09-22 MED ORDER — TAMSULOSIN HCL 0.4 MG PO CAPS
0.4000 mg | ORAL_CAPSULE | Freq: Once | ORAL | Status: AC
  Administered 2020-09-22: 0.4 mg via ORAL
  Filled 2020-09-22: qty 1

## 2020-09-22 MED ORDER — HYDROCODONE-ACETAMINOPHEN 5-325 MG PO TABS
1.0000 | ORAL_TABLET | Freq: Three times a day (TID) | ORAL | 0 refills | Status: AC | PRN
Start: 2020-09-22 — End: 2020-09-27

## 2020-09-22 NOTE — Discharge Instructions (Signed)
For pain control you may take at 600 mg of Ibuprofen every 6-8 hours as needed.  In addition you can take 0.5 to 1 tablet of Norco/Vicodin every 8 hours for pain not controlled with the scheduled Tylenol.

## 2020-09-22 NOTE — ED Triage Notes (Signed)
Patient reports increasing pain at left flank with hematuria this week , denies fever or chills . No emesis .

## 2020-09-22 NOTE — ED Provider Notes (Signed)
[facility] EMERGENCY DEPARTMENT Provider Note  CSN: [id number] Arrival date & time: 09/22/20 0229  Chief Complaint(s) Flank Pain  HPI Jacqueline [name] is a 29 y.o. female the past medical history listed below who presents to the emergency department with left-sided flank pain that began as mild ache several days ago.  Fluctuating nature.  Worsened tonight to severe pain.  Patient took Motrin at home with mild improvement.  No other alleviating or aggravating factors.  Patient also reports hematuria and a couple bouts of nonbloody nonbilious emesis due to the severity of the pain.  She denies recent fevers or infections.  No coughing or congestion.  No other physical complaints.  HPI  Past Medical History Past Medical History:  Diagnosis Date  . Adenopathy, Left Cervical   . Amenorrhea   . GERD (gastroesophageal reflux disease)   . History of Epstein-Barr virus infection   . Migraines   . Morbid (severe) obesity due to excess calories (HCC) 02/03/2017  . PCOS (polycystic ovarian syndrome)   . Pericarditis 11/11/2015  . Vitamin D deficiency 02/03/2017   Patient Active Problem List   Diagnosis Date Noted  . Prediabetes 02/07/2017  . Migraines   . GERD (gastroesophageal reflux disease)   . Polycystic ovarian syndrome 02/03/2017  . Vitamin D deficiency 02/03/2017  . Morbid (severe) obesity due to excess calories (HCC) 02/03/2017   Home Medication(s) Prior to Admission medications   Medication Sig Start Date End Date Taking? Authorizing Provider  amoxicillin (AMOXIL) 875 MG tablet Take 1 tablet (875 mg total) by mouth 2 (two) times daily. 12/14/18   Jarold Motto, PA  HYDROcodone-acetaminophen (NORCO/VICODIN) 5-325 MG tablet Take 1 tablet by mouth every 8 (eight) hours as needed for up to 5 days for severe pain (That is not improved by your Ibuprofen regimen). Please do not exceed 4000 mg of acetaminophen (Tylenol) a 24-hour period. Please note that he may be  prescribed additional medicine that contains acetaminophen. 09/22/20 09/27/20  Nira Conn, MD  ondansetron (ZOFRAN ODT) 4 MG disintegrating tablet Take 1 tablet (4 mg total) by mouth every 8 (eight) hours as needed for up to 3 days for nausea or vomiting. 09/22/20 09/25/20  Nira Conn, MD  tamsulosin (FLOMAX) 0.4 MG CAPS capsule Take 1 capsule (0.4 mg total) by mouth daily for 10 days. 09/22/20 10/02/20  Nira Conn, MD                                                                                                                                    Past Surgical History Past Surgical History:  Procedure Laterality Date  . LYMPH NODE BIOPSY Left 12/11/2014   Procedure: LYMPH NODE BIOPSY;  Surgeon: Flo Shanks, MD;  Location: [facility];  Service: ENT;  Laterality: Left;  . WISDOM TOOTH EXTRACTION     Family History Family History  Problem Relation Age of Onset  .  Diabetes Mother   . Hypertension Mother   . Cancer Maternal Grandmother        Hodgkin's Lymphoma  . Diabetes Paternal Grandmother   . Heart disease Paternal Grandmother   . Heart disease Paternal Grandfather   . Polycystic ovary syndrome Paternal Aunt     Social History Social History   Tobacco Use  . Smoking status: Never Smoker  . Smokeless tobacco: Never Used  Vaping Use  . Vaping Use: Never used  Substance Use Topics  . Alcohol use: Yes    Alcohol/week: 0.0 standard drinks    Comment: social  . Drug use: No   Allergies Colchicine, Indomethacin, and Phentermine  Review of Systems Review of Systems All other systems are reviewed and are negative for acute change except as noted in the HPI  Physical Exam Vital Signs  I have reviewed the triage vital signs BP (!) 110/55 (BP Location: Right Arm)   Pulse 80   Temp 98.1 F (36.7 C) (Oral)   Resp 17   Ht  (1.626 m)   Wt 135 kg   SpO2 100%   BMI 51.09 kg/m   Physical Exam Vitals reviewed.    Constitutional:      General: She is not in acute distress.    Appearance: She is well-developed. She is obese. She is not diaphoretic.  HENT:     Head: Normocephalic and atraumatic.     Nose: Nose normal.  Eyes:     General: No scleral icterus.       Right eye: No discharge.        Left eye: No discharge.     Conjunctiva/sclera: Conjunctivae normal.     Pupils: Pupils are equal, round, and reactive to light.  Cardiovascular:     Rate and Rhythm: Normal rate and regular rhythm.     Heart sounds: No murmur heard.  No friction rub. No gallop.   Pulmonary:     Effort: Pulmonary effort is normal. No respiratory distress.     Breath sounds: Normal breath sounds. No stridor. No rales.  Abdominal:     General: There is no distension.     Palpations: Abdomen is soft.     Tenderness: There is no abdominal tenderness. There is no right CVA tenderness or left CVA tenderness.  Musculoskeletal:        General: No tenderness.     Cervical back: Normal range of motion and neck supple.  Skin:    General: Skin is warm and dry.     Findings: No erythema or rash.  Neurological:     Mental Status: She is alert and oriented to person, place, and time.     ED Results and Treatments Labs (all labs ordered are listed, but only abnormal results are displayed) Labs Reviewed  BASIC METABOLIC PANEL - Abnormal; Notable for the following components:      Result Value   Glucose, Bld 132 (*)    All other components within normal limits  URINALYSIS, ROUTINE W REFLEX MICROSCOPIC - Abnormal; Notable for the following components:   APPearance HAZY (*)    Specific Gravity, Urine 1.034 (*)    Hgb urine dipstick LARGE (*)    Ketones, ur 5 (*)    Protein, ur 30 (*)    RBC / HPF >50 (*)    All other components within normal limits  CBC WITH DIFFERENTIAL/PLATELET  I-STAT BETA HCG BLOOD, ED ([facility], [facility], [facility] ONLY)  EKG  EKG Interpretation  Date/Time:    Ventricular Rate:    PR Interval:    QRS Duration:   QT Interval:    QTC Calculation:   R Axis:     Text Interpretation:        Radiology No results found.  Pertinent labs & imaging results that were available during my care of the patient were reviewed by me and considered in my medical decision making (see chart for details).  Medications Ordered in ED Medications  ketorolac (TORADOL) injection 30 mg (30 mg Intramuscular Given 09/22/20 0454)  ondansetron (ZOFRAN-ODT) disintegrating tablet 4 mg (4 mg Oral Given 09/22/20 0454)  tamsulosin (FLOMAX) capsule 0.4 mg (0.4 mg Oral Given 09/22/20 0510)                                                                                                                                    Procedures Procedures  (including critical care time)  Medical Decision Making / ED Course I have reviewed the nursing notes for this encounter and the patient's prior records (if available in EHR or on provided paperwork).   Jacqueline [name] was evaluated in Emergency Department on 09/22/2020 for the symptoms described in the history of present illness. She was evaluated in the context of the global COVID-19 pandemic, which necessitated consideration that the patient might be at risk for infection with the SARS-CoV-2 virus that causes COVID-19. Institutional protocols and algorithms that pertain to the evaluation of patients at risk for COVID-19 are in a state of rapid change based on information released by regulatory bodies including the CDC and federal and state organizations. These policies and algorithms were followed during the patient's care in the ED.  On review of records, patient had a CT stone study in 2017 that revealed small punctate stones in the left kidney.  Based on the patient's presentation renal colic is the most likely etiology.  UA notable for hematuria without evidence of  infection, supporting this diagnosis. Beta-hCG negative. Labs without leukocytosis or anemia. No significant electrolyte derangements or renal insufficiency.  No need for additional imaging at this time.  We will treat patient with antiemetics and IM Toradol and reassess.  6:45 AM On multiple reassessments, patient remained comfortable with significantly improved pain.  She was able to tolerate oral intake.      Final Clinical Impression(s) / ED Diagnoses Final diagnoses:  Ureteral colic   The patient appears reasonably screened and/or stabilized for discharge and I doubt any other medical condition or other [facility] requiring further screening, evaluation, or treatment in the ED at this time prior to discharge. Safe for discharge with strict return precautions.  Disposition: Discharge  Condition: Good  I have discussed the results, Dx and Tx plan with the patient/family who expressed understanding and agree(s) with the plan. Discharge instructions discussed at length. The patient/family was given strict return precautions who verbalized understanding of the  instructions. No further questions at time of discharge.    ED Discharge Orders         Ordered    ondansetron (ZOFRAN ODT) 4 MG disintegrating tablet  Every 8 hours PRN        09/22/20 0639    tamsulosin (FLOMAX) 0.4 MG CAPS capsule  Daily        09/22/20 0639    HYDROcodone-acetaminophen (NORCO/VICODIN) 5-325 MG tablet  Every 8 hours PRN        09/22/20 6168          [facility] narcotic database reviewed and no active prescriptions noted.   Follow Up: Felipa Furnace, NP [address] [place] [address] [phone]  Call  As needed  Urology  Call  To schedule an appointment for close follow up      This chart was dictated using voice recognition software.  Despite best efforts to proofread,  errors can occur which can change the documentation meaning.   Nira Conn, MD 09/22/20  [phone]

## 2020-09-22 NOTE — ED Notes (Signed)
Patient verbalizes understanding of discharge instructions. Opportunity for questioning and answers were provided. Armband removed by staff, pt discharged from ED stable & ambulatory  

## 2020-09-27 DIAGNOSIS — N200 Calculus of kidney: Secondary | ICD-10-CM | POA: Insufficient documentation

## 2020-10-10 HISTORY — PX: HYSTEROSCOPY: SHX211

## 2020-12-12 ENCOUNTER — Other Ambulatory Visit (HOSPITAL_COMMUNITY): Payer: Self-pay | Admitting: Gynecology

## 2021-01-15 DIAGNOSIS — Z3141 Encounter for fertility testing: Secondary | ICD-10-CM | POA: Diagnosis not present

## 2021-01-18 MED FILL — CHORIONIC GONAD 10,000 UNIT: 10000 | 30 days supply | Qty: 1 | Fill #0

## 2021-01-25 DIAGNOSIS — Z3141 Encounter for fertility testing: Secondary | ICD-10-CM | POA: Diagnosis not present

## 2021-01-29 DIAGNOSIS — Z3189 Encounter for other procreative management: Secondary | ICD-10-CM | POA: Diagnosis not present

## 2021-02-18 ENCOUNTER — Other Ambulatory Visit: Payer: Self-pay

## 2021-02-18 ENCOUNTER — Emergency Department
Admission: RE | Admit: 2021-02-18 | Discharge: 2021-02-18 | Disposition: A | Discharge status: Still In Hospital | Payer: PRIVATE HEALTH INSURANCE | Source: Ambulatory Visit

## 2021-02-18 ENCOUNTER — Emergency Department (INDEPENDENT_AMBULATORY_CARE_PROVIDER_SITE_OTHER)
Admission: EM | Admit: 2021-02-18 | Discharge: 2021-02-18 | Disposition: A | Discharge status: Home / Self Care | Payer: 59 | Source: Home / Self Care | Attending: Internal Medicine | Admitting: Internal Medicine

## 2021-02-18 DIAGNOSIS — M542 Cervicalgia: Secondary | ICD-10-CM | POA: Diagnosis not present

## 2021-02-18 MED ORDER — CYCLOBENZAPRINE HCL 10 MG PO TABS: 10.0000 mg | ORAL_TABLET | Freq: Two times a day (BID) | ORAL | 0 refills | Status: DC | PRN

## 2021-02-18 MED ORDER — ACETAMINOPHEN 325 MG PO TABS
650.0000 mg | ORAL_TABLET | Freq: Four times a day (QID) | ORAL | Status: DC | PRN
  Administered 2021-02-18: 650 mg via ORAL
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
## 2021-02-18 NOTE — Discharge Instructions (Addendum)
Gentle range of motion exercises Take medications as prescribed Warm compresses with 10 minutes on-10 minutes off cycle 3-4 times a day will help relieve pain. NSAIDs as needed for pain
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
## 2021-02-21 ENCOUNTER — Emergency Department (HOSPITAL_COMMUNITY): Payer: 59

## 2021-02-21 ENCOUNTER — Encounter (HOSPITAL_COMMUNITY): Payer: Self-pay | Admitting: *Deleted

## 2021-02-21 ENCOUNTER — Ambulatory Visit
Admission: RE | Admit: 2021-02-21 | Discharge: 2021-02-21 | Disposition: A | Discharge status: Home / Self Care | Payer: PRIVATE HEALTH INSURANCE | Source: Ambulatory Visit

## 2021-02-21 ENCOUNTER — Other Ambulatory Visit: Payer: Self-pay

## 2021-02-21 ENCOUNTER — Emergency Department (HOSPITAL_COMMUNITY)
Admission: EM | Admit: 2021-02-21 | Discharge: 2021-02-21 | Disposition: A | Discharge status: Home / Self Care | Payer: 59 | Attending: Emergency Medicine | Admitting: Emergency Medicine

## 2021-02-21 DIAGNOSIS — R519 Headache, unspecified: Secondary | ICD-10-CM | POA: Diagnosis not present

## 2021-02-21 DIAGNOSIS — S060X0A Concussion without loss of consciousness, initial encounter: Secondary | ICD-10-CM | POA: Insufficient documentation

## 2021-02-21 DIAGNOSIS — Y92410 Unspecified street and highway as the place of occurrence of the external cause: Secondary | ICD-10-CM | POA: Diagnosis not present

## 2021-02-21 DIAGNOSIS — S0083XA Contusion of other part of head, initial encounter: Secondary | ICD-10-CM | POA: Diagnosis not present

## 2021-02-21 DIAGNOSIS — H5711 Ocular pain, right eye: Secondary | ICD-10-CM | POA: Diagnosis not present

## 2021-02-21 DIAGNOSIS — G4489 Other headache syndrome: Secondary | ICD-10-CM

## 2021-02-21 DIAGNOSIS — S161XXA Strain of muscle, fascia and tendon at neck level, initial encounter: Secondary | ICD-10-CM | POA: Diagnosis not present

## 2021-02-21 DIAGNOSIS — M25511 Pain in right shoulder: Secondary | ICD-10-CM | POA: Diagnosis not present

## 2021-02-21 DIAGNOSIS — S0511XA Contusion of eyeball and orbital tissues, right eye, initial encounter: Secondary | ICD-10-CM

## 2021-02-21 DIAGNOSIS — S0990XA Unspecified injury of head, initial encounter: Secondary | ICD-10-CM | POA: Diagnosis present

## 2021-02-21 MED ORDER — NAPROXEN 500 MG PO TABS: 500.0000 mg | ORAL_TABLET | Freq: Two times a day (BID) | ORAL | 0 refills | Status: DC

## 2021-02-21 MED ORDER — ONDANSETRON 4 MG PO TBDP: 4.0000 mg | ORAL_TABLET | Freq: Three times a day (TID) | ORAL | 0 refills | Status: DC | PRN

## 2021-02-21 NOTE — Discharge Instructions (Addendum)
Go to ER to have more test done for your R eye, head and face pain.

## 2021-02-21 NOTE — Discharge Instructions (Signed)
Tylenol or naproxen for pain, please read the attached instructions regarding concussion, Zofran as needed for nausea, drink plenty of clear liquids, return for severe worsening symptoms  Please see the phone number for neurology, if you are no better within 2 weeks you will need to be seen by them
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
## 2021-02-25 ENCOUNTER — Ambulatory Visit (HOSPITAL_BASED_OUTPATIENT_CLINIC_OR_DEPARTMENT_OTHER): Payer: PRIVATE HEALTH INSURANCE | Admitting: Nurse Practitioner

## 2021-02-27 ENCOUNTER — Encounter (HOSPITAL_BASED_OUTPATIENT_CLINIC_OR_DEPARTMENT_OTHER): Payer: Self-pay | Admitting: Family Medicine

## 2021-02-27 ENCOUNTER — Other Ambulatory Visit: Payer: Self-pay

## 2021-02-27 ENCOUNTER — Ambulatory Visit (HOSPITAL_BASED_OUTPATIENT_CLINIC_OR_DEPARTMENT_OTHER): Payer: 59 | Admitting: Family Medicine

## 2021-02-27 VITALS — BP 112/86 | HR 88 | Ht 64.0 in | Wt 284.6 lb

## 2021-02-27 DIAGNOSIS — S060X0A Concussion without loss of consciousness, initial encounter: Secondary | ICD-10-CM | POA: Diagnosis not present

## 2021-02-27 DIAGNOSIS — E282 Polycystic ovarian syndrome: Secondary | ICD-10-CM | POA: Diagnosis not present

## 2021-02-27 DIAGNOSIS — S060X9A Concussion with loss of consciousness of unspecified duration, initial encounter: Secondary | ICD-10-CM | POA: Insufficient documentation

## 2021-02-27 DIAGNOSIS — S060XAA Concussion with loss of consciousness status unknown, initial encounter: Secondary | ICD-10-CM | POA: Insufficient documentation

## 2021-02-27 NOTE — Patient Instructions (Signed)
  Medication Instructions:  Your physician recommends that you continue on your current medications as directed. Please refer to the Current Medication list given to you today. --If you need a refill on any your medications before your next appointment, please call your pharmacy first. If no refills are authorized on file call the office.--  Referrals/Procedures/Imaging: A referral has been placed for you to Dr. Hyacinth Meeker with [facility]. Someone from the scheduling department will be in contact with you in regards to coordinating your consultation. If you do not hear from any of the schedulers within 7-10 business days please give our office a call.  Follow-Up: Your next appointment:   Your physician recommends that you schedule a follow-up appointment in: 3 WEEKS with Dr. de Peru  Thanks for letting us be apart of your health journey!!  Primary Care and Sports Medicine   Dr. de Peru and Shawna Clamp, DNP, AGNP  We recommend signing up for the patient portal called "MyChart".  Sign up information is provided on this After Visit Summary.  MyChart is used to connect with patients for Virtual Visits (Telemedicine).  Patients are able to view lab/test results, encounter notes, upcoming appointments, etc.  Non-urgent messages can be sent to your provider as well.   To learn more about what you can do with MyChart, please visit --  [email or web address].
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
## 2021-02-27 NOTE — Assessment & Plan Note (Signed)
History and exam consistent with diagnosis of concussion Advised on avoidance of aggravating factors, particular related to screens, noises May attempt to perform daily activities, should any worsening of symptoms occur advised to take breaks from activities as needed for recovery Can continue to work with chiropractor who seems to primarily be guiding care at this time -has patient out of work for now, will defer return to work decisions to chiropractor Important to ensure adequate nighttime sleep, avoid naps if possible Continue with adequate hydration Discussed possible referral to physical therapy for treatment of neck pain, back pain, vestibular symptoms, will hold for now

## 2021-02-27 NOTE — Assessment & Plan Note (Signed)
Will refer to OB/GYN for management

## 2021-03-01 ENCOUNTER — Ambulatory Visit (HOSPITAL_BASED_OUTPATIENT_CLINIC_OR_DEPARTMENT_OTHER): Payer: PRIVATE HEALTH INSURANCE | Admitting: Nurse Practitioner

## 2021-03-19 ENCOUNTER — Ambulatory Visit (INDEPENDENT_AMBULATORY_CARE_PROVIDER_SITE_OTHER): Payer: 59 | Admitting: Orthopaedic Surgery

## 2021-03-19 ENCOUNTER — Encounter: Payer: Self-pay | Admitting: Orthopaedic Surgery

## 2021-03-19 ENCOUNTER — Ambulatory Visit: Payer: Self-pay

## 2021-03-19 VITALS — BP 125/88 | HR 114

## 2021-03-19 DIAGNOSIS — S39012A Strain of muscle, fascia and tendon of lower back, initial encounter: Secondary | ICD-10-CM | POA: Diagnosis not present

## 2021-03-19 DIAGNOSIS — M542 Cervicalgia: Secondary | ICD-10-CM | POA: Diagnosis not present

## 2021-03-19 MED ORDER — NAPROXEN 500 MG PO TABS: 500.0000 mg | ORAL_TABLET | Freq: Two times a day (BID) | ORAL | 1 refills | Status: DC
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
## 2021-03-20 ENCOUNTER — Other Ambulatory Visit: Payer: Self-pay

## 2021-03-20 ENCOUNTER — Encounter (HOSPITAL_BASED_OUTPATIENT_CLINIC_OR_DEPARTMENT_OTHER): Payer: Self-pay | Admitting: Family Medicine

## 2021-03-20 ENCOUNTER — Ambulatory Visit (INDEPENDENT_AMBULATORY_CARE_PROVIDER_SITE_OTHER): Payer: 59 | Admitting: Family Medicine

## 2021-03-20 VITALS — BP 130/88 | HR 82 | Ht 64.0 in | Wt 286.2 lb

## 2021-03-20 DIAGNOSIS — S39012D Strain of muscle, fascia and tendon of lower back, subsequent encounter: Secondary | ICD-10-CM

## 2021-03-20 DIAGNOSIS — S060X0D Concussion without loss of consciousness, subsequent encounter: Secondary | ICD-10-CM | POA: Diagnosis not present

## 2021-03-20 NOTE — Patient Instructions (Addendum)
  Medication Instructions:  Your physician recommends that you continue on your current medications as directed. Please refer to the Current Medication list given to you today. --If you need a refill on any your medications before your next appointment, please call your pharmacy first. If no refills are authorized on file call the office.--  Referrals/Procedures/Imaging: A referral has been placed for you to [facility] for evaluation and treatment. Someone from the scheduling department will be in contact with you in regards to coordinating your consultation. If you do not hear from any of the schedulers within 7-10 business days please give our office a call.  A referral has been placed for you for Physical Therapy for evaluation and treatment. Someone from the scheduling department will be in contact with you in regards to coordinating your consultation. If you do not hear from any of the schedulers within 7-10 business days please give our office a call.  Follow-Up: Your next appointment:   Your physician recommends that you schedule a follow-up appointment in: 4 WEEKS with Dr. de Peru  Thanks for letting us be apart of your health journey!!  Primary Care and Sports Medicine   Dr. de Peru and Shawna Clamp, DNP, AGNP  We recommend signing up for the patient portal called "MyChart".  Sign up information is provided on this After Visit Summary.  MyChart is used to connect with patients for Virtual Visits (Telemedicine).  Patients are able to view lab/test results, encounter notes, upcoming appointments, etc.  Non-urgent messages can be sent to your provider as well.   To learn more about what you can do with MyChart, please visit --  [email or web address].

## 2021-03-21 ENCOUNTER — Ambulatory Visit (HOSPITAL_BASED_OUTPATIENT_CLINIC_OR_DEPARTMENT_OTHER): Payer: 59 | Admitting: Family Medicine

## 2021-03-21 ENCOUNTER — Encounter (HOSPITAL_BASED_OUTPATIENT_CLINIC_OR_DEPARTMENT_OTHER): Payer: Self-pay | Admitting: Family Medicine

## 2021-03-21 ENCOUNTER — Ambulatory Visit (INDEPENDENT_AMBULATORY_CARE_PROVIDER_SITE_OTHER): Payer: No Typology Code available for payment source | Admitting: Bariatrics

## 2021-03-21 NOTE — Assessment & Plan Note (Addendum)
Persists despite conservative measures thus far Discussed options with patient, she would like to continue with chiropractor and would like referral to alternative orthopedic surgeon Will also refer to physical therapy, advised on home exercise program as per PT Can continue with OTC medications, topical therapies as needed Recently had MRI completed which revealed 2 mm broad-based central disc protrusion at L5-S1, otherwise normal MRI.  There is no evidence of cord or nerve root impingement on imaging. She will continue working with chiropractor regarding work limitations and restrictions

## 2021-03-21 NOTE — Assessment & Plan Note (Signed)
Management of associated concussion and lumbar strain as above Plan for referral to physical therapy to better address lumbar strain

## 2021-03-21 NOTE — Assessment & Plan Note (Signed)
Symptoms related to concussion are largely improving and nearly back to baseline Continue with activity modification as needed, including breaks as needed if any worsening of symptoms with increased activity or with screen use Can continue working with chiropractor regarding MSK issues
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
## 2021-03-25 ENCOUNTER — Encounter (HOSPITAL_BASED_OUTPATIENT_CLINIC_OR_DEPARTMENT_OTHER): Payer: Self-pay | Admitting: Family Medicine

## 2021-03-27 ENCOUNTER — Ambulatory Visit: Payer: 59 | Admitting: Orthopaedic Surgery

## 2021-03-27 DIAGNOSIS — R262 Difficulty in walking, not elsewhere classified: Secondary | ICD-10-CM | POA: Diagnosis not present

## 2021-03-27 DIAGNOSIS — M6281 Muscle weakness (generalized): Secondary | ICD-10-CM | POA: Diagnosis not present

## 2021-03-27 DIAGNOSIS — M2560 Stiffness of unspecified joint, not elsewhere classified: Secondary | ICD-10-CM | POA: Diagnosis not present

## 2021-03-27 DIAGNOSIS — M5451 Vertebrogenic low back pain: Secondary | ICD-10-CM | POA: Diagnosis not present

## 2021-03-27 DIAGNOSIS — M542 Cervicalgia: Secondary | ICD-10-CM | POA: Diagnosis not present

## 2021-03-29 DIAGNOSIS — M542 Cervicalgia: Secondary | ICD-10-CM | POA: Diagnosis not present

## 2021-03-29 DIAGNOSIS — M2560 Stiffness of unspecified joint, not elsewhere classified: Secondary | ICD-10-CM | POA: Diagnosis not present

## 2021-03-29 DIAGNOSIS — R262 Difficulty in walking, not elsewhere classified: Secondary | ICD-10-CM | POA: Diagnosis not present

## 2021-03-29 DIAGNOSIS — M5451 Vertebrogenic low back pain: Secondary | ICD-10-CM | POA: Diagnosis not present

## 2021-03-29 DIAGNOSIS — M6281 Muscle weakness (generalized): Secondary | ICD-10-CM | POA: Diagnosis not present

## 2021-04-01 ENCOUNTER — Other Ambulatory Visit (HOSPITAL_COMMUNITY): Payer: Self-pay

## 2021-04-01 ENCOUNTER — Ambulatory Visit (INDEPENDENT_AMBULATORY_CARE_PROVIDER_SITE_OTHER): Payer: No Typology Code available for payment source | Admitting: Bariatrics

## 2021-04-01 DIAGNOSIS — M2560 Stiffness of unspecified joint, not elsewhere classified: Secondary | ICD-10-CM | POA: Diagnosis not present

## 2021-04-01 DIAGNOSIS — M6281 Muscle weakness (generalized): Secondary | ICD-10-CM | POA: Diagnosis not present

## 2021-04-01 DIAGNOSIS — M542 Cervicalgia: Secondary | ICD-10-CM | POA: Diagnosis not present

## 2021-04-01 DIAGNOSIS — E282 Polycystic ovarian syndrome: Secondary | ICD-10-CM | POA: Diagnosis not present

## 2021-04-01 DIAGNOSIS — Z319 Encounter for procreative management, unspecified: Secondary | ICD-10-CM | POA: Diagnosis not present

## 2021-04-01 DIAGNOSIS — R262 Difficulty in walking, not elsewhere classified: Secondary | ICD-10-CM | POA: Diagnosis not present

## 2021-04-01 DIAGNOSIS — E2839 Other primary ovarian failure: Secondary | ICD-10-CM | POA: Diagnosis not present

## 2021-04-01 DIAGNOSIS — M5451 Vertebrogenic low back pain: Secondary | ICD-10-CM | POA: Diagnosis not present

## 2021-04-01 DIAGNOSIS — N981 Hyperstimulation of ovaries: Secondary | ICD-10-CM | POA: Diagnosis not present

## 2021-04-01 DIAGNOSIS — N970 Female infertility associated with anovulation: Secondary | ICD-10-CM | POA: Diagnosis not present

## 2021-04-01 MED ORDER — CHORIOGONADOTROPIN ALFA 250 MCG/0.5ML ~~LOC~~ INJ
250.0000 ug | INJECTION | SUBCUTANEOUS | 3 refills | Status: DC
  Filled 2021-04-01: qty 0.5, 1d supply, fill #0
  Filled 2021-06-04: qty 0.5, 1d supply, fill #1

## 2021-04-01 MED ORDER — LETROZOLE 2.5 MG PO TABS
7.5000 mg | ORAL_TABLET | Freq: Every day | ORAL | 3 refills | Status: DC
  Filled 2021-04-01: qty 15, 5d supply, fill #0
  Filled 2021-06-03: qty 15, 5d supply, fill #1

## 2021-04-01 MED ORDER — METFORMIN HCL 1000 MG PO TABS
1000.0000 mg | ORAL_TABLET | Freq: Two times a day (BID) | ORAL | 11 refills | Status: DC
  Filled 2021-04-01: qty 60, 30d supply, fill #0
  Filled 2021-05-16: qty 60, 30d supply, fill #1

## 2021-04-02 ENCOUNTER — Encounter (HOSPITAL_BASED_OUTPATIENT_CLINIC_OR_DEPARTMENT_OTHER): Payer: Self-pay

## 2021-04-02 DIAGNOSIS — M5126 Other intervertebral disc displacement, lumbar region: Secondary | ICD-10-CM | POA: Diagnosis not present

## 2021-04-02 DIAGNOSIS — M5020 Other cervical disc displacement, unspecified cervical region: Secondary | ICD-10-CM | POA: Diagnosis not present

## 2021-04-03 DIAGNOSIS — M6281 Muscle weakness (generalized): Secondary | ICD-10-CM | POA: Diagnosis not present

## 2021-04-03 DIAGNOSIS — M2560 Stiffness of unspecified joint, not elsewhere classified: Secondary | ICD-10-CM | POA: Diagnosis not present

## 2021-04-03 DIAGNOSIS — M542 Cervicalgia: Secondary | ICD-10-CM | POA: Diagnosis not present

## 2021-04-03 DIAGNOSIS — M5451 Vertebrogenic low back pain: Secondary | ICD-10-CM | POA: Diagnosis not present

## 2021-04-03 DIAGNOSIS — R262 Difficulty in walking, not elsewhere classified: Secondary | ICD-10-CM | POA: Diagnosis not present

## 2021-04-05 DIAGNOSIS — M6281 Muscle weakness (generalized): Secondary | ICD-10-CM | POA: Diagnosis not present

## 2021-04-05 DIAGNOSIS — M2560 Stiffness of unspecified joint, not elsewhere classified: Secondary | ICD-10-CM | POA: Diagnosis not present

## 2021-04-05 DIAGNOSIS — R262 Difficulty in walking, not elsewhere classified: Secondary | ICD-10-CM | POA: Diagnosis not present

## 2021-04-05 DIAGNOSIS — M542 Cervicalgia: Secondary | ICD-10-CM | POA: Diagnosis not present

## 2021-04-05 DIAGNOSIS — M5451 Vertebrogenic low back pain: Secondary | ICD-10-CM | POA: Diagnosis not present

## 2021-04-10 DIAGNOSIS — R262 Difficulty in walking, not elsewhere classified: Secondary | ICD-10-CM | POA: Diagnosis not present

## 2021-04-10 DIAGNOSIS — M2560 Stiffness of unspecified joint, not elsewhere classified: Secondary | ICD-10-CM | POA: Diagnosis not present

## 2021-04-10 DIAGNOSIS — M5451 Vertebrogenic low back pain: Secondary | ICD-10-CM | POA: Diagnosis not present

## 2021-04-10 DIAGNOSIS — M6281 Muscle weakness (generalized): Secondary | ICD-10-CM | POA: Diagnosis not present

## 2021-04-10 DIAGNOSIS — M542 Cervicalgia: Secondary | ICD-10-CM | POA: Diagnosis not present

## 2021-04-11 DIAGNOSIS — R262 Difficulty in walking, not elsewhere classified: Secondary | ICD-10-CM | POA: Diagnosis not present

## 2021-04-11 DIAGNOSIS — M5451 Vertebrogenic low back pain: Secondary | ICD-10-CM | POA: Diagnosis not present

## 2021-04-11 DIAGNOSIS — M2560 Stiffness of unspecified joint, not elsewhere classified: Secondary | ICD-10-CM | POA: Diagnosis not present

## 2021-04-11 DIAGNOSIS — M542 Cervicalgia: Secondary | ICD-10-CM | POA: Diagnosis not present

## 2021-04-11 DIAGNOSIS — M6281 Muscle weakness (generalized): Secondary | ICD-10-CM | POA: Diagnosis not present

## 2021-04-17 ENCOUNTER — Encounter (HOSPITAL_BASED_OUTPATIENT_CLINIC_OR_DEPARTMENT_OTHER): Payer: Self-pay | Admitting: Family Medicine

## 2021-04-17 ENCOUNTER — Ambulatory Visit: Payer: 59 | Admitting: Orthopaedic Surgery

## 2021-04-17 ENCOUNTER — Other Ambulatory Visit: Payer: Self-pay

## 2021-04-17 ENCOUNTER — Encounter (HOSPITAL_BASED_OUTPATIENT_CLINIC_OR_DEPARTMENT_OTHER): Payer: Self-pay

## 2021-04-17 ENCOUNTER — Ambulatory Visit (HOSPITAL_BASED_OUTPATIENT_CLINIC_OR_DEPARTMENT_OTHER): Payer: 59 | Admitting: Family Medicine

## 2021-04-17 VITALS — BP 134/88 | HR 90 | Ht 64.0 in | Wt 289.4 lb

## 2021-04-17 DIAGNOSIS — S39012D Strain of muscle, fascia and tendon of lower back, subsequent encounter: Secondary | ICD-10-CM

## 2021-04-17 DIAGNOSIS — S161XXD Strain of muscle, fascia and tendon at neck level, subsequent encounter: Secondary | ICD-10-CM | POA: Diagnosis not present

## 2021-04-17 DIAGNOSIS — M6281 Muscle weakness (generalized): Secondary | ICD-10-CM | POA: Diagnosis not present

## 2021-04-17 DIAGNOSIS — S161XXA Strain of muscle, fascia and tendon at neck level, initial encounter: Secondary | ICD-10-CM | POA: Insufficient documentation

## 2021-04-17 DIAGNOSIS — R262 Difficulty in walking, not elsewhere classified: Secondary | ICD-10-CM | POA: Diagnosis not present

## 2021-04-17 DIAGNOSIS — M542 Cervicalgia: Secondary | ICD-10-CM | POA: Diagnosis not present

## 2021-04-17 DIAGNOSIS — M5451 Vertebrogenic low back pain: Secondary | ICD-10-CM | POA: Diagnosis not present

## 2021-04-17 DIAGNOSIS — M2560 Stiffness of unspecified joint, not elsewhere classified: Secondary | ICD-10-CM | POA: Diagnosis not present

## 2021-04-17 NOTE — Patient Instructions (Signed)
  Medication Instructions:  Your physician recommends that you continue on your current medications as directed. Please refer to the Current Medication list given to you today. --If you need a refill on any your medications before your next appointment, please call your pharmacy first. If no refills are authorized on file call the office.--  Follow-Up: Your next appointment:   Your physician recommends that you schedule a follow-up appointment in: 4 WEEKS with Dr. de Peru  Thanks for letting us be apart of your health journey!!  Primary Care and Sports Medicine   Dr. de Peru and Shawna Clamp, DNP, AGNP  We recommend signing up for the patient portal called "MyChart".  Sign up information is provided on this After Visit Summary.  MyChart is used to connect with patients for Virtual Visits (Telemedicine).  Patients are able to view lab/test results, encounter notes, upcoming appointments, etc.  Non-urgent messages can be sent to your provider as well.   To learn more about what you can do with MyChart, please visit --  [email or web address].

## 2021-04-17 NOTE — Assessment & Plan Note (Signed)
Continues to work with physical therapy, attending about twice per week.  Notes about 75% improvement in her back pain since initial onset.  Still utilizing work restrictions, presently working 5 hours/day, 4 days/week.  Generally restrictions have been going well, occasionally will have days where symptoms are worsened but this is infrequent. Recommend continuing with physical therapy, home exercise program as per PT Can utilize OTC medications, topical treatments to help with symptoms as needed Continue with work restrictions, plan to transition to maximum 25 hours/week, 4 days/week, no more than 7 hours consecutively

## 2021-04-17 NOTE — Assessment & Plan Note (Signed)
Working with physical therapy as above, did develop some dizziness after working with physical therapy.  She is now working with vestibular rehab specialist within physical therapy clinic which I feel is reasonable and would be recommended to continue with. Continue with PT as above Continue with OTC medications, topical treatments as needed Work restrictions as above

## 2021-04-17 NOTE — Progress Notes (Signed)
**Note [name]-Identified via Obfuscation**     Procedures performed today:    None.  Independent interpretation of notes and tests performed by another provider:   None.  Brief History, Exam, Impression, and Recommendations:    BP 134/88   Pulse 90   Ht 5\' 4"  (1.626 m)   Wt 289 lb 6.4 oz (131.3 kg)   SpO2 99%   BMI 49.68 kg/m   Lumbar strain Continues to work with physical therapy, attending about twice per week.  Notes about 75% improvement in her back pain since initial onset.  Still utilizing work restrictions, presently working 5 hours/day, 4 days/week.  Generally restrictions have been going well, occasionally will have days where symptoms are worsened but this is infrequent. Recommend continuing with physical therapy, home exercise program as per PT Can utilize OTC medications, topical treatments to help with symptoms as needed Continue with work restrictions, plan to transition to maximum 25 hours/week, 4 days/week, no more than 7 hours consecutively  Cervical strain Working with physical therapy as above, did develop some dizziness after working with physical therapy.  She is now working with vestibular rehab specialist within physical therapy clinic which I feel is reasonable and would be recommended to continue with. Continue with PT as above Continue with OTC medications, topical treatments as needed Work restrictions as above  Plan for follow-up in about 4 weeks or sooner as needed   ___________________________________________ Jacqueline [name] , MD, ABFM, [facility] Primary Care and Sports Medicine [facility]

## 2021-04-19 ENCOUNTER — Other Ambulatory Visit (HOSPITAL_COMMUNITY): Payer: Self-pay

## 2021-04-19 DIAGNOSIS — M2560 Stiffness of unspecified joint, not elsewhere classified: Secondary | ICD-10-CM | POA: Diagnosis not present

## 2021-04-19 DIAGNOSIS — N970 Female infertility associated with anovulation: Secondary | ICD-10-CM | POA: Diagnosis not present

## 2021-04-19 DIAGNOSIS — R262 Difficulty in walking, not elsewhere classified: Secondary | ICD-10-CM | POA: Diagnosis not present

## 2021-04-19 DIAGNOSIS — M6281 Muscle weakness (generalized): Secondary | ICD-10-CM | POA: Diagnosis not present

## 2021-04-19 DIAGNOSIS — M5451 Vertebrogenic low back pain: Secondary | ICD-10-CM | POA: Diagnosis not present

## 2021-04-19 DIAGNOSIS — N912 Amenorrhea, unspecified: Secondary | ICD-10-CM | POA: Diagnosis not present

## 2021-04-19 DIAGNOSIS — N981 Hyperstimulation of ovaries: Secondary | ICD-10-CM | POA: Diagnosis not present

## 2021-04-19 DIAGNOSIS — Z319 Encounter for procreative management, unspecified: Secondary | ICD-10-CM | POA: Diagnosis not present

## 2021-04-19 DIAGNOSIS — E282 Polycystic ovarian syndrome: Secondary | ICD-10-CM | POA: Diagnosis not present

## 2021-04-19 DIAGNOSIS — M542 Cervicalgia: Secondary | ICD-10-CM | POA: Diagnosis not present

## 2021-04-19 MED ORDER — VITAMIN D3 250 MCG (10000 UT) PO CAPS: 10000.0000 [IU] | ORAL_CAPSULE | ORAL | 3 refills | Status: DC

## 2021-04-19 MED ORDER — MEDROXYPROGESTERONE ACETATE 10 MG PO TABS
10.0000 mg | ORAL_TABLET | Freq: Every day | ORAL | 0 refills | Status: DC
  Filled 2021-04-19: qty 5, 5d supply, fill #0

## 2021-04-19 MED ORDER — VITAMIN D3 1.25 MG (50000 UT) PO CAPS
1.0000 | ORAL_CAPSULE | ORAL | 3 refills | Status: DC
  Filled 2021-04-19: qty 4, 28d supply, fill #0
  Filled 2021-05-16: qty 4, 28d supply, fill #1
  Filled 2021-07-20: qty 4, 28d supply, fill #2
  Filled 2021-07-25: qty 4, 56d supply, fill #2

## 2021-04-23 ENCOUNTER — Other Ambulatory Visit (HOSPITAL_COMMUNITY): Payer: Self-pay

## 2021-04-24 DIAGNOSIS — M542 Cervicalgia: Secondary | ICD-10-CM | POA: Diagnosis not present

## 2021-04-24 DIAGNOSIS — M6281 Muscle weakness (generalized): Secondary | ICD-10-CM | POA: Diagnosis not present

## 2021-04-24 DIAGNOSIS — M2560 Stiffness of unspecified joint, not elsewhere classified: Secondary | ICD-10-CM | POA: Diagnosis not present

## 2021-04-24 DIAGNOSIS — M5451 Vertebrogenic low back pain: Secondary | ICD-10-CM | POA: Diagnosis not present

## 2021-04-24 DIAGNOSIS — R262 Difficulty in walking, not elsewhere classified: Secondary | ICD-10-CM | POA: Diagnosis not present

## 2021-04-26 DIAGNOSIS — R262 Difficulty in walking, not elsewhere classified: Secondary | ICD-10-CM | POA: Diagnosis not present

## 2021-04-26 DIAGNOSIS — M2560 Stiffness of unspecified joint, not elsewhere classified: Secondary | ICD-10-CM | POA: Diagnosis not present

## 2021-04-26 DIAGNOSIS — M6281 Muscle weakness (generalized): Secondary | ICD-10-CM | POA: Diagnosis not present

## 2021-04-26 DIAGNOSIS — M5451 Vertebrogenic low back pain: Secondary | ICD-10-CM | POA: Diagnosis not present

## 2021-04-26 DIAGNOSIS — M542 Cervicalgia: Secondary | ICD-10-CM | POA: Diagnosis not present

## 2021-04-29 ENCOUNTER — Telehealth (HOSPITAL_BASED_OUTPATIENT_CLINIC_OR_DEPARTMENT_OTHER): Payer: Self-pay | Admitting: Family Medicine

## 2021-04-29 NOTE — Telephone Encounter (Signed)
Called pt to get $25 fee for filing FMLA. Stated she will call back to give card information.

## 2021-04-30 DIAGNOSIS — M6281 Muscle weakness (generalized): Secondary | ICD-10-CM | POA: Diagnosis not present

## 2021-04-30 DIAGNOSIS — R262 Difficulty in walking, not elsewhere classified: Secondary | ICD-10-CM | POA: Diagnosis not present

## 2021-04-30 DIAGNOSIS — M2560 Stiffness of unspecified joint, not elsewhere classified: Secondary | ICD-10-CM | POA: Diagnosis not present

## 2021-04-30 DIAGNOSIS — M5451 Vertebrogenic low back pain: Secondary | ICD-10-CM | POA: Diagnosis not present

## 2021-04-30 DIAGNOSIS — M542 Cervicalgia: Secondary | ICD-10-CM | POA: Diagnosis not present

## 2021-05-02 ENCOUNTER — Telehealth: Payer: Self-pay | Admitting: Orthopaedic Surgery

## 2021-05-02 NOTE — Telephone Encounter (Signed)
Ov notes faxed [facility] Office [phone], ph [phone], Edson Snowball

## 2021-05-02 NOTE — Telephone Encounter (Signed)
Paperwork printed and placed in Dr. de Peru blue FMLA folder

## 2021-05-07 ENCOUNTER — Telehealth (HOSPITAL_BASED_OUTPATIENT_CLINIC_OR_DEPARTMENT_OTHER): Payer: Self-pay | Admitting: Family Medicine

## 2021-05-07 NOTE — Telephone Encounter (Signed)
Pts FMLA paperwork came threw via fax from [place]. In providers RED dot tray. Pt has paid $25 filling fee for the paperwork. Please advise.

## 2021-05-07 NOTE — Telephone Encounter (Signed)
Paperwork placed in [employer] folder and given to Dr. Tommi Rumps Peru

## 2021-05-08 DIAGNOSIS — M6281 Muscle weakness (generalized): Secondary | ICD-10-CM | POA: Diagnosis not present

## 2021-05-08 DIAGNOSIS — M2560 Stiffness of unspecified joint, not elsewhere classified: Secondary | ICD-10-CM | POA: Diagnosis not present

## 2021-05-08 DIAGNOSIS — M5451 Vertebrogenic low back pain: Secondary | ICD-10-CM | POA: Diagnosis not present

## 2021-05-08 DIAGNOSIS — R262 Difficulty in walking, not elsewhere classified: Secondary | ICD-10-CM | POA: Diagnosis not present

## 2021-05-08 DIAGNOSIS — M542 Cervicalgia: Secondary | ICD-10-CM | POA: Diagnosis not present

## 2021-05-10 DIAGNOSIS — M2560 Stiffness of unspecified joint, not elsewhere classified: Secondary | ICD-10-CM | POA: Diagnosis not present

## 2021-05-10 DIAGNOSIS — R262 Difficulty in walking, not elsewhere classified: Secondary | ICD-10-CM | POA: Diagnosis not present

## 2021-05-10 DIAGNOSIS — M5451 Vertebrogenic low back pain: Secondary | ICD-10-CM | POA: Diagnosis not present

## 2021-05-10 DIAGNOSIS — M6281 Muscle weakness (generalized): Secondary | ICD-10-CM | POA: Diagnosis not present

## 2021-05-10 DIAGNOSIS — Z3189 Encounter for other procreative management: Secondary | ICD-10-CM | POA: Diagnosis not present

## 2021-05-10 DIAGNOSIS — M542 Cervicalgia: Secondary | ICD-10-CM | POA: Diagnosis not present

## 2021-05-14 DIAGNOSIS — Z3189 Encounter for other procreative management: Secondary | ICD-10-CM | POA: Diagnosis not present

## 2021-05-15 ENCOUNTER — Other Ambulatory Visit: Payer: Self-pay

## 2021-05-15 ENCOUNTER — Encounter (HOSPITAL_BASED_OUTPATIENT_CLINIC_OR_DEPARTMENT_OTHER): Payer: Self-pay

## 2021-05-15 ENCOUNTER — Encounter (HOSPITAL_BASED_OUTPATIENT_CLINIC_OR_DEPARTMENT_OTHER): Payer: Self-pay | Admitting: Family Medicine

## 2021-05-15 ENCOUNTER — Ambulatory Visit (HOSPITAL_BASED_OUTPATIENT_CLINIC_OR_DEPARTMENT_OTHER): Payer: 59 | Admitting: Family Medicine

## 2021-05-15 VITALS — BP 108/70 | HR 97 | Ht 64.0 in | Wt 289.0 lb

## 2021-05-15 DIAGNOSIS — S39012D Strain of muscle, fascia and tendon of lower back, subsequent encounter: Secondary | ICD-10-CM | POA: Diagnosis not present

## 2021-05-15 DIAGNOSIS — Z3189 Encounter for other procreative management: Secondary | ICD-10-CM | POA: Diagnosis not present

## 2021-05-15 NOTE — Progress Notes (Signed)
    Procedures performed today:    None.  Independent interpretation of notes and tests performed by another provider:   None.  Brief History, Exam, Impression, and Recommendations:     BP 108/70   Pulse 97   Ht 5\' 4"  (1.626 m)   Wt 289 lb (131.1 kg)   SpO2 99%   BMI 49.61 kg/m   Lumbar strain Overall reports that she has been doing well recently Feels the pain is about 90 to 95% improved Continues to work with physical therapy Feels that work has been going well, has been doing about 6 to 7-hour workdays and is tolerating this well She does feel that she would be able to return to normal work schedule.  She is aware that there may be longer days that consist of 12 to 13-hour shifts Letter provided today allowing for patient to return to full duty without restrictions at work Advised on continuing with physical therapy, completing home exercise program Will plan to follow-up in about 4 to 6 weeks to monitor progress with above or sooner if any worsening or concerns   ___________________________________________ Jacqueline Boroff de , MD, ABFM, CAQSM Primary Care and Sports Medicine [facility]

## 2021-05-15 NOTE — Patient Instructions (Signed)
  Medication Instructions:  Your physician recommends that you continue on your current medications as directed. Please refer to the Current Medication list given to you today. --If you need a refill on any your medications before your next appointment, please call your pharmacy first. If no refills are authorized on file call the office.--  Follow-Up: Your next appointment:   Your physician recommends that you schedule a follow-up appointment in: 4-6 WEEKS with Dr. de [place]  Thanks for letting us be apart of your health journey!!  Primary Care and Sports Medicine   Dr. Ceasar Mons Peru   We encourage you to activate your patient portal called "MyChart".  Sign up information is provided on this After Visit Summary.  MyChart is used to connect with patients for Virtual Visits (Telemedicine).  Patients are able to view lab/test results, encounter notes, upcoming appointments, etc.  Non-urgent messages can be sent to your provider as well. To learn more about what you can do with MyChart, please visit --  [email or web address].

## 2021-05-15 NOTE — Assessment & Plan Note (Signed)
Overall reports that she has been doing well recently Feels the pain is about 90 to 95% improved Continues to work with physical therapy Feels that work has been going well, has been doing about 6 to 7-hour workdays and is tolerating this well She does feel that she would be able to return to normal work schedule.  She is aware that there may be longer days that consist of 12 to 13-hour shifts Letter provided today allowing for patient to return to full duty without restrictions at work Advised on continuing with physical therapy, completing home exercise program Will plan to follow-up in about 4 to 6 weeks to monitor progress with above or sooner if any worsening or concerns

## 2021-05-16 ENCOUNTER — Encounter (HOSPITAL_BASED_OUTPATIENT_CLINIC_OR_DEPARTMENT_OTHER): Payer: Self-pay | Admitting: Family Medicine

## 2021-05-17 ENCOUNTER — Other Ambulatory Visit (HOSPITAL_COMMUNITY): Payer: Self-pay

## 2021-05-20 ENCOUNTER — Other Ambulatory Visit (HOSPITAL_COMMUNITY): Payer: Self-pay

## 2021-05-22 DIAGNOSIS — M2560 Stiffness of unspecified joint, not elsewhere classified: Secondary | ICD-10-CM | POA: Diagnosis not present

## 2021-05-22 DIAGNOSIS — M5451 Vertebrogenic low back pain: Secondary | ICD-10-CM | POA: Diagnosis not present

## 2021-05-22 DIAGNOSIS — R262 Difficulty in walking, not elsewhere classified: Secondary | ICD-10-CM | POA: Diagnosis not present

## 2021-05-22 DIAGNOSIS — M6281 Muscle weakness (generalized): Secondary | ICD-10-CM | POA: Diagnosis not present

## 2021-05-22 DIAGNOSIS — M542 Cervicalgia: Secondary | ICD-10-CM | POA: Diagnosis not present

## 2021-05-30 ENCOUNTER — Encounter (HOSPITAL_BASED_OUTPATIENT_CLINIC_OR_DEPARTMENT_OTHER): Payer: Self-pay | Admitting: Family Medicine

## 2021-05-30 DIAGNOSIS — E282 Polycystic ovarian syndrome: Secondary | ICD-10-CM

## 2021-06-03 ENCOUNTER — Other Ambulatory Visit (HOSPITAL_COMMUNITY): Payer: Self-pay

## 2021-06-04 ENCOUNTER — Other Ambulatory Visit (HOSPITAL_COMMUNITY): Payer: Self-pay

## 2021-06-04 DIAGNOSIS — E661 Drug-induced obesity: Secondary | ICD-10-CM | POA: Diagnosis not present

## 2021-06-04 MED ORDER — METFORMIN HCL 1000 MG PO TABS
1000.0000 mg | ORAL_TABLET | Freq: Two times a day (BID) | ORAL | 3 refills | Status: DC
  Filled 2021-06-04 – 2021-07-25 (×3): qty 180, 90d supply, fill #0

## 2021-06-04 MED ORDER — "NEEDLE (DISP) 25G X 5/8"" MISC"
2 refills | Status: DC
  Filled 2021-06-04: qty 1, 1d supply, fill #0

## 2021-06-04 MED ORDER — INSULIN PEN NEEDLE 31G X 6 MM MISC
0 refills | Status: DC
  Filled 2021-06-04: qty 100, 90d supply, fill #0

## 2021-06-04 MED ORDER — OZEMPIC (0.25 OR 0.5 MG/DOSE) 2 MG/1.5ML ~~LOC~~ SOPN
PEN_INJECTOR | SUBCUTANEOUS | 0 refills | Status: DC
  Filled 2021-06-04: qty 1.5, 42d supply, fill #0

## 2021-06-04 MED ORDER — CHORIONIC GONADOTROPIN 10000 UNITS IM SOLR
INTRAMUSCULAR | 2 refills | Status: DC
  Filled 2021-06-04: qty 1, 1d supply, fill #0

## 2021-06-04 MED ORDER — "BD LUER-LOK SYRINGE 18G X 1-1/2"" 3 ML MISC"
2 refills | Status: DC
  Filled 2021-06-04: qty 1, 1d supply, fill #0

## 2021-06-07 DIAGNOSIS — R262 Difficulty in walking, not elsewhere classified: Secondary | ICD-10-CM | POA: Diagnosis not present

## 2021-06-07 DIAGNOSIS — M2560 Stiffness of unspecified joint, not elsewhere classified: Secondary | ICD-10-CM | POA: Diagnosis not present

## 2021-06-07 DIAGNOSIS — M542 Cervicalgia: Secondary | ICD-10-CM | POA: Diagnosis not present

## 2021-06-07 DIAGNOSIS — M6281 Muscle weakness (generalized): Secondary | ICD-10-CM | POA: Diagnosis not present

## 2021-06-07 DIAGNOSIS — M5451 Vertebrogenic low back pain: Secondary | ICD-10-CM | POA: Diagnosis not present

## 2021-06-10 DIAGNOSIS — Z3189 Encounter for other procreative management: Secondary | ICD-10-CM | POA: Diagnosis not present

## 2021-06-11 ENCOUNTER — Other Ambulatory Visit (HOSPITAL_COMMUNITY): Payer: Self-pay

## 2021-06-18 ENCOUNTER — Encounter (HOSPITAL_BASED_OUTPATIENT_CLINIC_OR_DEPARTMENT_OTHER): Payer: Self-pay | Admitting: Family Medicine

## 2021-06-18 ENCOUNTER — Ambulatory Visit (HOSPITAL_BASED_OUTPATIENT_CLINIC_OR_DEPARTMENT_OTHER): Payer: 59 | Admitting: Family Medicine

## 2021-06-18 ENCOUNTER — Other Ambulatory Visit: Payer: Self-pay

## 2021-06-18 VITALS — BP 128/90 | HR 92 | Ht 64.0 in | Wt 291.0 lb

## 2021-06-18 DIAGNOSIS — Z87442 Personal history of urinary calculi: Secondary | ICD-10-CM | POA: Insufficient documentation

## 2021-06-18 DIAGNOSIS — S39012D Strain of muscle, fascia and tendon of lower back, subsequent encounter: Secondary | ICD-10-CM

## 2021-06-18 NOTE — Patient Instructions (Signed)
  Medication Instructions:  Your physician recommends that you continue on your current medications as directed. Please refer to the Current Medication list given to you today. --If you need a refill on any your medications before your next appointment, please call your pharmacy first. If no refills are authorized on file call the office.--  Referrals/Procedures/Imaging: A referral has been placed for you to [facility] for evaluation and treatment. Someone from the scheduling department will be in contact with you in regards to coordinating your consultation. If you do not hear from any of the schedulers within 7-10 business days please give our office a call.  Follow-Up: Your next appointment:   Your physician recommends that you schedule a follow-up appointment in: 3 MONTHS with Dr. de [place]  Thanks for letting us be apart of your health journey!!  Primary Care and Sports Medicine   Dr. Ceasar Mons [place]   We encourage you to activate your patient portal called "MyChart".  Sign up information is provided on this After Visit Summary.  MyChart is used to connect with patients for Virtual Visits (Telemedicine).  Patients are able to view lab/test results, encounter notes, upcoming appointments, etc.  Non-urgent messages can be sent to your provider as well. To learn more about what you can do with MyChart, please visit --  [email or web address].

## 2021-06-18 NOTE — Progress Notes (Signed)
    Procedures performed today:    None.  Independent interpretation of notes and tests performed by another provider:   None.  Brief History, Exam, Impression, and Recommendations:    BP 128/90   Pulse 92   Ht 5\' 4"  (1.626 m)   Wt 291 lb (132 kg)   SpO2 99%   BMI 49.95 kg/m   Lumbar strain Has been doing well since last visit.  Has returned to a more normal work schedule, has had some longer days.  On the longer days, she has noticed some increased in back pain, soreness.  She does continue to work on home exercise program.  Denies any new symptoms.  Has completed physical therapy sessions. Recommend the patient can continue with work without restrictions at this time Recommend continuing with home exercise program as instructed by physical therapy If symptoms are worsened by activities at work or otherwise, can consider referral back to physical therapy for further treatment  History of kidney stones Had previously been referred to urology for further evaluation and management, did not completely pass, requesting referral to urology today Referral placed for patient to establish with urology, possibly proceed with evaluation  Morbid (severe) obesity due to excess calories [facility]) Patient is currently undergoing infertility treatment with specialist, they were discussing Ozempic to help with.  Patient had some questions regarding this due to reviewing side effects and precautions. Discussed common side effects with patient including GI upset, nausea, vomiting.  Discussed other potential side effects and precautions including related to kidney function.  Discussed that history of kidney stones should not preclude use of medication as renal impact is primarily related to any underlying kidney disease as opposed to nephrolithiasis.  Plan for follow-up in about 3 to 6 months.  We will schedule for 3 months, however she is doing well instruct that she could push his appointment back  further regarding follow-up  Spent 30 minutes on this patient encounter, including preparation, chart review, face-to-face counseling with patient and coordination of care, and documentation of encounter   ___________________________________________ Amadu Schlageter de [facility], MD, ABFM, CAQSM Primary Care and Sports Medicine [facility]

## 2021-06-18 NOTE — Assessment & Plan Note (Signed)
Has been doing well since last visit.  Has returned to a more normal work schedule, has had some longer days.  On the longer days, she has noticed some increased in back pain, soreness.  She does continue to work on home exercise program.  Denies any new symptoms.  Has completed physical therapy sessions. Recommend the patient can continue with work without restrictions at this time Recommend continuing with home exercise program as instructed by physical therapy If symptoms are worsened by activities at work or otherwise, can consider referral back to physical therapy for further treatment

## 2021-06-18 NOTE — Assessment & Plan Note (Signed)
Patient is currently undergoing infertility treatment with specialist, they were discussing Ozempic to help with.  Patient had some questions regarding this due to reviewing side effects and precautions. Discussed common side effects with patient including GI upset, nausea, vomiting.  Discussed other potential side effects and precautions including related to kidney function.  Discussed that history of kidney stones should not preclude use of medication as renal impact is primarily related to any underlying kidney disease as opposed to nephrolithiasis.

## 2021-06-18 NOTE — Assessment & Plan Note (Signed)
Had previously been referred to urology for further evaluation and management, did not completely pass, requesting referral to urology today Referral placed for patient to establish with urology, possibly proceed with evaluation

## 2021-06-26 ENCOUNTER — Ambulatory Visit (INDEPENDENT_AMBULATORY_CARE_PROVIDER_SITE_OTHER): Payer: 59 | Admitting: Bariatrics

## 2021-06-26 ENCOUNTER — Encounter (INDEPENDENT_AMBULATORY_CARE_PROVIDER_SITE_OTHER): Payer: Self-pay | Admitting: Bariatrics

## 2021-06-26 ENCOUNTER — Other Ambulatory Visit: Payer: Self-pay

## 2021-06-26 VITALS — BP 140/77 | HR 92 | Temp 98.0°F | Ht 64.0 in | Wt 286.0 lb

## 2021-06-26 DIAGNOSIS — Z87442 Personal history of urinary calculi: Secondary | ICD-10-CM

## 2021-06-26 DIAGNOSIS — E282 Polycystic ovarian syndrome: Secondary | ICD-10-CM

## 2021-06-26 DIAGNOSIS — R0602 Shortness of breath: Secondary | ICD-10-CM | POA: Diagnosis not present

## 2021-06-26 DIAGNOSIS — Z1331 Encounter for screening for depression: Secondary | ICD-10-CM

## 2021-06-26 DIAGNOSIS — Z9189 Other specified personal risk factors, not elsewhere classified: Secondary | ICD-10-CM | POA: Diagnosis not present

## 2021-06-26 DIAGNOSIS — R5383 Other fatigue: Secondary | ICD-10-CM | POA: Diagnosis not present

## 2021-06-26 DIAGNOSIS — E559 Vitamin D deficiency, unspecified: Secondary | ICD-10-CM | POA: Diagnosis not present

## 2021-06-26 DIAGNOSIS — E538 Deficiency of other specified B group vitamins: Secondary | ICD-10-CM | POA: Diagnosis not present

## 2021-06-26 DIAGNOSIS — Z6841 Body Mass Index (BMI) 40.0 and over, adult: Secondary | ICD-10-CM

## 2021-06-26 DIAGNOSIS — Z0289 Encounter for other administrative examinations: Secondary | ICD-10-CM

## 2021-06-27 ENCOUNTER — Encounter (INDEPENDENT_AMBULATORY_CARE_PROVIDER_SITE_OTHER): Payer: Self-pay | Admitting: Bariatrics

## 2021-06-27 DIAGNOSIS — R7989 Other specified abnormal findings of blood chemistry: Secondary | ICD-10-CM | POA: Insufficient documentation

## 2021-06-27 LAB — TSH+T4F+T3FREE
Free T4: 1.35 ng/dL (ref 0.82–1.77)
T3, Free: 4 pg/mL (ref 2.0–4.4)
TSH: 0.934 u[IU]/mL (ref 0.450–4.500)

## 2021-06-27 LAB — LIPID PANEL WITH LDL/HDL RATIO
Cholesterol, Total: 194 mg/dL (ref 100–199)
HDL: 57 mg/dL (ref >39)
LDL Chol Calc (NIH): 108 mg/dL — ABNORMAL HIGH (ref 0–99)
LDL/HDL Ratio: 1.9 ratio (ref 0.0–3.2)
Triglycerides: 165 mg/dL — ABNORMAL HIGH (ref 0–149)
VLDL Cholesterol Cal: 29 mg/dL (ref 5–40)

## 2021-06-27 LAB — COMPREHENSIVE METABOLIC PANEL
ALT: 54 IU/L — ABNORMAL HIGH (ref 0–32)
AST: 57 IU/L — ABNORMAL HIGH (ref 0–40)
Albumin/Globulin Ratio: 1.6 (ref 1.2–2.2)
Albumin: 4.6 g/dL (ref 3.8–4.8)
Alkaline Phosphatase: 75 IU/L (ref 44–121)
BUN/Creatinine Ratio: 14 (ref 9–23)
BUN: 9 mg/dL (ref 6–20)
Bilirubin Total: 0.4 mg/dL (ref 0.0–1.2)
CO2: 18 mmol/L — ABNORMAL LOW (ref 20–29)
Calcium: 9.7 mg/dL (ref 8.7–10.2)
Chloride: 105 mmol/L (ref 96–106)
Creatinine, Ser: 0.63 mg/dL (ref 0.57–1.00)
Globulin, Total: 2.8 g/dL (ref 1.5–4.5)
Glucose: 89 mg/dL (ref 65–99)
Potassium: 4.5 mmol/L (ref 3.5–5.2)
Sodium: 141 mmol/L (ref 134–144)
Total Protein: 7.4 g/dL (ref 6.0–8.5)
eGFR: 122 mL/min/{1.73_m2} (ref >59)

## 2021-06-27 LAB — VITAMIN B12: Vitamin B-12: 492 pg/mL (ref 232–1245)

## 2021-06-27 LAB — INSULIN, RANDOM: INSULIN: 26.1 u[IU]/mL — ABNORMAL HIGH (ref 2.6–24.9)

## 2021-06-27 LAB — HEMOGLOBIN A1C
Est. average glucose Bld gHb Est-mCnc: 123 mg/dL
Hgb A1c MFr Bld: 5.9 % — ABNORMAL HIGH (ref 4.8–5.6)

## 2021-06-27 LAB — VITAMIN D 25 HYDROXY (VIT D DEFICIENCY, FRACTURES): Vit D, 25-Hydroxy: 43.3 ng/mL (ref 30.0–100.0)

## 2021-06-27 NOTE — Progress Notes (Signed)
Chief Complaint:   OBESITY Jacqueline [name] (MR# [id number]) is a 30 y.o. female who presents for evaluation and treatment of obesity and related comorbidities. Current BMI is Body mass index is 49.09 kg/m. Jacqueline [name] has been struggling with her weight for many years and has been unsuccessful in either losing weight, maintaining weight loss, or reaching her healthy weight goal.  Rolla usually likes to cook but she notes time as an obstacle due to working late. She craves chips and sweets. She is thinking of starting a family (possibly IVF).   [name] is currently in the action stage of change and ready to dedicate time achieving and maintaining a healthier weight. Jacqueline [name] is interested in becoming our patient and working on intensive lifestyle modifications including (but not limited to) diet and exercise for weight loss.  Jacqueline [name]'s habits were reviewed today and are as follows: Her family eats meals together, she thinks her family will eat healthier with her, her desired weight loss is 106 lbs, she has been heavy most of her life, she started gaining weight in 2017, her heaviest weight ever was 275 pounds, she has significant food cravings issues, she snacks frequently in the evenings, she skips meals frequently, she is frequently drinking liquids with calories, she frequently makes poor food choices, she frequently eats larger portions than normal, and she struggles with emotional eating.  Depression Screen Jacqueline [name]'s Food and Mood (modified PHQ-9) score was 17.  Depression screen PHQ 2/9 06/26/2021  Decreased Interest 3  Down, Depressed, Hopeless 2  PHQ - 2 Score 5  Altered sleeping 3  Tired, decreased energy 3  Change in appetite 1  Feeling bad or failure about yourself  1  Trouble concentrating 1  Moving slowly or fidgety/restless 3  Suicidal thoughts 0  PHQ-9 Score 17  Difficult doing work/chores Not difficult at all   Subjective:   1. Other fatigue [name] admits to daytime  somnolence and admits to waking up still tired. Patent has a history of symptoms of daytime fatigue and morning headache. Jacqueline [name] generally gets 6 or 8 hours of sleep per night, and states that she has generally restful sleep. Snoring is present. Apneic episodes are not present. [name] is 6.  2. SOB (shortness of breath) on exertion [name] notes increasing shortness of breath with exercising and seems to be worsening over time with weight gain. She notes getting out of breath sooner with activity than she used to. This has not gotten worse recently. [name] denies shortness of breath at rest or orthopnea.  3. Polycystic ovarian syndrome Jacqueline [name]'s last A1c was 5.6. She is taking metformin, and she has a prescription for Ozempic but she has not started it yet.  4. Vitamin D deficiency Jacqueline [name] is taking Vit D prescription.  5. History of kidney stones Jacqueline [name] has a history of kidney stones, and she is not on medications.  6. Vitamin B 12 deficiency Jacqueline [name] is not currently on medications for B12.  7. At risk for activity intolerance Jacqueline [name] is at risk for exercise intolerance due to obesity and weather.  Assessment/Plan:   1. Other fatigue Jacqueline [name] does feel that her weight is causing her energy to be lower than it should be. Fatigue may be related to obesity, depression or many other causes. Labs will be ordered, and in the meanwhile, [name] will focus on self care including making healthy food choices, increasing physical activity and focusing on stress reduction.  - EKG 12-Lead - Vitamin B12 - Comprehensive  metabolic panel - Hemoglobin A1c - Insulin, random - Lipid Panel With LDL/HDL Ratio - VITAMIN D 25 Hydroxy (Vit-D Deficiency, Fractures) - TSH+T4F+T3Free  2. SOB (shortness of breath) on exertion Jacqueline [name] does feel that she gets out of breath more easily that she used to when she exercises. Jacqueline [name]'s shortness of breath appears to be obesity related and exercise induced.  She has agreed to work on weight loss and gradually increase exercise to treat her exercise induced shortness of breath. Will continue to monitor closely.  3. Polycystic ovarian syndrome Intensive lifestyle modifications are first line treatment for this issue. We discussed several lifestyle modifications today. Jacqueline [name] will continue metformin, and she will start Ozempic this week or next week. We will check labs today, and she will continue to work on diet, exercise and weight loss efforts. Orders and follow up as documented in patient record.  Counseling PCOS is a leading cause of menstrual irregularities and infertility. It is also associated with obesity, hirsutism (excessive hair growth on the face, chest, or back), and cardiovascular risk factors such as high cholesterol and insulin resistance. Insulin resistance appears to play a central role.  Women with PCOS have been shown to have impaired appetite-regulating hormones. Metformin is one medication that can improve metabolic parameters.  Women with polycystic ovary syndrome (PCOS) have an increased risk for cardiovascular disease (CVD) - European Journal of Preventive Cardiology.  - Hemoglobin A1c - Insulin, random  4. Vitamin D deficiency Low Vitamin D level contributes to fatigue and are associated with obesity, breast, and colon cancer. We will check labs today. Jacqueline [name] will follow-up for routine testing of Vitamin D, at least 2-3 times per year to avoid over-replacement.  - VITAMIN D 25 Hydroxy (Vit-D Deficiency, Fractures)  5. History of kidney stones Jacqueline [name] will keep her water intake high, and we will follow up at her office visit.  6. Vitamin B 12 deficiency The diagnosis was reviewed with the patient. Counseling provided today, see below. We will check labs today, and will continue to monitor. Orders and follow up as documented in patient record.  Counseling The body needs vitamin B12: to make red blood cells; to make DNA;  and to help the nerves work properly so they can carry messages from the brain to the body.  The main causes of vitamin B12 deficiency include dietary deficiency, digestive diseases, pernicious anemia, and having a surgery in which part of the stomach or small intestine is removed.  Certain medicines can make it harder for the body to absorb vitamin B12. These medicines include: heartburn medications; some antibiotics; some medications used to treat diabetes, gout, and high cholesterol.  In some cases, there are no symptoms of this condition. If the condition leads to anemia or nerve damage, various symptoms can occur, such as weakness or fatigue, shortness of breath, and numbness or tingling in your hands and feet.   Treatment:  May include taking vitamin B12 supplements.  Avoid alcohol.  Eat lots of healthy foods that contain vitamin B12: Beef, pork, chicken, [place], and organ meats, such as liver.  Seafood: This includes clams, rainbow trout, salmon, tuna, and haddock. Eggs.  Cereal and dairy products that are fortified: This means that vitamin B12 has been added to the food.   - Vitamin B12  7. Depression screen Tykeria had a positive depression screening. Depression is commonly associated with obesity and often results in emotional eating behaviors. We will monitor this closely and work on CBT to help improve the non-hunger  eating patterns. Referral to Psychology may be required if no improvement is seen as she continues in our clinic.  8. At risk for activity intolerance Joni Reiningicole was given approximately 15 minutes of exercise intolerance counseling today. She is 30 y.o. female and has risk factors exercise intolerance including obesity. We discussed intensive lifestyle modifications today with an emphasis on specific weight loss instructions and strategies. Joni Reiningicole will slowly increase activity as tolerated.  Repetitive spaced learning was employed today to elicit superior memory formation and  behavioral change.  9. Class 3 severe obesity with serious comorbidity and body mass index (BMI) of 45.0 to 49.9 in adult, unspecified obesity type [facility](HCC) Joni Reiningicole is currently in the action stage of change and her goal is to continue with weight loss efforts. I recommend Joni Reiningicole begin the structured treatment plan as follows:  She has agreed to the Category 3 Plan.  Intentional eating was discussed.  Exercise goals: No exercise has been prescribed at this time.   Behavioral modification strategies: increasing lean protein intake, decreasing simple carbohydrates, increasing vegetables, increasing water intake, decreasing eating out, no skipping meals, meal planning and cooking strategies, keeping healthy foods in the home, ways to avoid boredom eating, and planning for success.  She was informed of the importance of frequent follow-up visits to maximize her success with intensive lifestyle modifications for her multiple health conditions. She was informed we would discuss her lab results at her next visit unless there is a critical issue that needs to be addressed sooner. Joni Reiningicole agreed to keep her next visit at the agreed upon time to discuss these results.  Objective:   Blood pressure 140/77, pulse 92, temperature 98 F (36.7 C), height 5\' 4"  (1.626 m), weight 286 lb (129.7 kg), last menstrual period 06/02/2021, SpO2 99 %. Body mass index is 49.09 kg/m.  EKG: Normal sinus rhythm, rate 76 BPM.  Indirect Calorimeter completed today shows a VO2 of 294 and a REE of 2030.  Her calculated basal metabolic rate is [id number] thus her basal metabolic rate is worse than expected.  General: Cooperative, alert, well developed, in no acute distress. HEENT: Conjunctivae and lids unremarkable. Cardiovascular: Regular rhythm.  Lungs: Normal work of breathing. Neurologic: No focal deficits.   Lab Results  Component Value Date   CREATININE 0.63 06/26/2021   BUN 9 06/26/2021   NA 141 06/26/2021   K 4.5  06/26/2021   CL 105 06/26/2021   CO2 18 (L) 06/26/2021   Lab Results  Component Value Date   ALT 54 (H) 06/26/2021   AST 57 (H) 06/26/2021   ALKPHOS 75 06/26/2021   BILITOT 0.4 06/26/2021   Lab Results  Component Value Date   HGBA1C 5.9 (H) 06/26/2021   HGBA1C 5.8 06/09/2018   HGBA1C 5.8 02/04/2017   Lab Results  Component Value Date   INSULIN 26.1 (H) 06/26/2021   Lab Results  Component Value Date   TSH 0.934 06/26/2021   Lab Results  Component Value Date   CHOL 194 06/26/2021   HDL 57 06/26/2021   LDLCALC 108 (H) 06/26/2021   TRIG 165 (H) 06/26/2021   CHOLHDL 3 06/09/2018   Lab Results  Component Value Date   WBC 10.0 09/22/2020   HGB 14.0 09/22/2020   HCT 43.1 09/22/2020   MCV 88.0 09/22/2020   PLT 280 09/22/2020   No results found for: IRON, TIBC, FERRITIN  Attestation Statements:   Reviewed by clinician on day of visit: allergies, medications, problem list, medical history, surgical history, family history,  social history, and previous encounter notes.   Trude Mcburney, am acting as Energy manager for [employer], DO.  I have reviewed the above documentation for accuracy and completeness, and I agree with the above. Corinna Capra, DO

## 2021-07-01 ENCOUNTER — Encounter (INDEPENDENT_AMBULATORY_CARE_PROVIDER_SITE_OTHER): Payer: Self-pay | Admitting: Bariatrics

## 2021-07-09 ENCOUNTER — Ambulatory Visit (INDEPENDENT_AMBULATORY_CARE_PROVIDER_SITE_OTHER): Payer: 59 | Admitting: Bariatrics

## 2021-07-09 ENCOUNTER — Other Ambulatory Visit: Payer: Self-pay

## 2021-07-09 ENCOUNTER — Encounter (INDEPENDENT_AMBULATORY_CARE_PROVIDER_SITE_OTHER): Payer: Self-pay | Admitting: Bariatrics

## 2021-07-09 VITALS — BP 120/82 | HR 96 | Temp 97.9°F | Ht 64.0 in | Wt 286.0 lb

## 2021-07-09 DIAGNOSIS — Z9189 Other specified personal risk factors, not elsewhere classified: Secondary | ICD-10-CM | POA: Diagnosis not present

## 2021-07-09 DIAGNOSIS — Z6841 Body Mass Index (BMI) 40.0 and over, adult: Secondary | ICD-10-CM | POA: Diagnosis not present

## 2021-07-09 DIAGNOSIS — R7989 Other specified abnormal findings of blood chemistry: Secondary | ICD-10-CM

## 2021-07-09 DIAGNOSIS — R7303 Prediabetes: Secondary | ICD-10-CM | POA: Diagnosis not present

## 2021-07-10 ENCOUNTER — Encounter (INDEPENDENT_AMBULATORY_CARE_PROVIDER_SITE_OTHER): Payer: Self-pay | Admitting: Bariatrics
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
## 2021-07-22 ENCOUNTER — Other Ambulatory Visit (HOSPITAL_COMMUNITY): Payer: Self-pay

## 2021-07-23 DIAGNOSIS — M5020 Other cervical disc displacement, unspecified cervical region: Secondary | ICD-10-CM | POA: Diagnosis not present

## 2021-07-23 DIAGNOSIS — M5126 Other intervertebral disc displacement, lumbar region: Secondary | ICD-10-CM | POA: Diagnosis not present

## 2021-07-25 ENCOUNTER — Other Ambulatory Visit (HOSPITAL_COMMUNITY): Payer: Self-pay

## 2021-07-25 MED ORDER — OZEMPIC (0.25 OR 0.5 MG/DOSE) 2 MG/1.5ML ~~LOC~~ SOPN
0.5000 mg | PEN_INJECTOR | SUBCUTANEOUS | 2 refills | Status: DC
  Filled 2021-07-25: qty 1.5, 28d supply, fill #0
  Filled 2021-09-21: qty 6, 112d supply, fill #0
  Filled 2021-09-23 – 2021-09-27 (×2): qty 4.5, 84d supply, fill #0
  Filled 2021-10-01: qty 1.5, 28d supply, fill #0

## 2021-07-25 MED ORDER — OZEMPIC (0.25 OR 0.5 MG/DOSE) 2 MG/1.5ML ~~LOC~~ SOPN
PEN_INJECTOR | SUBCUTANEOUS | 3 refills | Status: DC
  Filled 2021-07-25: qty 4.5, 84d supply, fill #0
  Filled 2021-07-25: qty 4.5, 56d supply, fill #0

## 2021-07-31 ENCOUNTER — Encounter (INDEPENDENT_AMBULATORY_CARE_PROVIDER_SITE_OTHER): Payer: Self-pay | Admitting: Family Medicine

## 2021-07-31 ENCOUNTER — Ambulatory Visit (INDEPENDENT_AMBULATORY_CARE_PROVIDER_SITE_OTHER): Payer: 59 | Admitting: Family Medicine

## 2021-07-31 ENCOUNTER — Other Ambulatory Visit: Payer: Self-pay

## 2021-07-31 VITALS — BP 133/82 | HR 63 | Temp 97.7°F | Ht 64.0 in | Wt 278.0 lb

## 2021-07-31 DIAGNOSIS — E559 Vitamin D deficiency, unspecified: Secondary | ICD-10-CM

## 2021-07-31 DIAGNOSIS — Z6841 Body Mass Index (BMI) 40.0 and over, adult: Secondary | ICD-10-CM

## 2021-07-31 NOTE — Progress Notes (Signed)
Chief Complaint:   OBESITY Jacqueline [name] is here to discuss her progress with her obesity treatment plan along with follow-up of her obesity related diagnoses. Jacqueline [name] is on keeping a food journal and adhering to recommended goals of 1500 calories and 80-100 grams protein and states she is following her eating plan approximately 90% of the time with calories and 50% of the time with protein. Jacqueline [name] states she is swimming and hiking 45-180 minutes 3-4 times per week.  Today's visit was #: 4 Starting weight: 286 lbs Starting date: 06/26/2021 Today's weight: 278 lbs Today's date: 07/31/2021 Total lbs lost to date: 8 Total lbs lost since last in-office visit: 8  Interim History: Jacqueline [name] was previously seen by Dr. Manson Passey. She has previously been seen by Dr. Earlene Plater and wishes to be treated by her in the future. Her calorie intake is high and it is hard to hit protein goal. Jacqueline [name] gets her Ozempic and Metformin from fertility doctor.  Subjective:   1. Vitamin D deficiency Jacqueline [name] is tolerating medication(s) well without side effects.  Medication compliance is good and patient appears to be taking it as prescribed.  Denies additional concerns regarding this condition.   Assessment/Plan:  No orders of the defined types were placed in this encounter.   There are no discontinued medications.   No orders of the defined types were placed in this encounter.    1. Vitamin D deficiency Low Vitamin D level contributes to fatigue and are associated with obesity, breast, and colon cancer. She agrees to continue to take prescription Vitamin D 50,000 IU every other week and will follow-up for routine testing of Vitamin D, at least 2-3 times per year to avoid over-replacement.  2. Obesity with current BMI of 47.9  Jacqueline [name] is currently in the action stage of change. As such, her goal is to continue with weight loss efforts. She has agreed to keeping a food journal and adhering to recommended goals  of 1500 calories and 80-100 grams protein.   Keep a food log and bring it to next OV. Extensive counseling done to increase proteins and stay within calorie goals daily.  Exercise goals:  As is  Behavioral modification strategies: keeping a strict food journal.  Jacqueline [name] has agreed to follow-up with our clinic in 3 weeks. She was informed of the importance of frequent follow-up visits to maximize her success with intensive lifestyle modifications for her multiple health conditions.   Objective:   Blood pressure 133/82, pulse 63, temperature 97.7 F (36.5 C), height 5\' 4"  (1.626 m), weight 278 lb (126.1 kg), last menstrual period 07/03/2021, SpO2 98 %. Body mass index is 47.72 kg/m.  General: Cooperative, alert, well developed, in no acute distress. HEENT: Conjunctivae and lids unremarkable. Cardiovascular: Regular rhythm.  Lungs: Normal work of breathing. Neurologic: No focal deficits.   Lab Results  Component Value Date   CREATININE 0.63 06/26/2021   BUN 9 06/26/2021   NA 141 06/26/2021   K 4.5 06/26/2021   CL 105 06/26/2021   CO2 18 (L) 06/26/2021   Lab Results  Component Value Date   ALT 54 (H) 06/26/2021   AST 57 (H) 06/26/2021   ALKPHOS 75 06/26/2021   BILITOT 0.4 06/26/2021   Lab Results  Component Value Date   HGBA1C 5.9 (H) 06/26/2021   HGBA1C 5.8 06/09/2018   HGBA1C 5.8 02/04/2017   Lab Results  Component Value Date   INSULIN 26.1 (H) 06/26/2021   Lab Results  Component Value Date  TSH 0.934 06/26/2021   Lab Results  Component Value Date   CHOL 194 06/26/2021   HDL 57 06/26/2021   LDLCALC 108 (H) 06/26/2021   TRIG 165 (H) 06/26/2021   CHOLHDL 3 06/09/2018   Lab Results  Component Value Date   VD25OH 43.3 06/26/2021   VD25OH 19.95 (L) 06/09/2018   Lab Results  Component Value Date   WBC 10.0 09/22/2020   HGB 14.0 09/22/2020   HCT 43.1 09/22/2020   MCV 88.0 09/22/2020   PLT 280 09/22/2020    Attestation Statements:   Reviewed by  clinician on day of visit: allergies, medications, problem list, medical history, surgical history, family history, social history, and previous encounter notes.  Time spent on visit including pre-visit chart review and post-visit care and charting was 30 minutes.   Jacqueline [name], CMA, am acting as transcriptionist for [employer], DO.  I have reviewed the above documentation for accuracy and completeness, and I agree with the above. Jacqueline [name], D.O.  The 21st Century Cures Act was signed into law in 2016 which includes the topic of electronic health records.  This provides immediate access to information in MyChart.  This includes consultation notes, operative notes, office notes, lab results and pathology reports.  If you have any questions about what you read please let us know at your next visit so we can discuss your concerns and take corrective action if need be.  We are right here with you.

## 2021-08-08 ENCOUNTER — Other Ambulatory Visit: Payer: Self-pay

## 2021-08-08 ENCOUNTER — Encounter (HOSPITAL_BASED_OUTPATIENT_CLINIC_OR_DEPARTMENT_OTHER): Payer: Self-pay | Admitting: Obstetrics & Gynecology

## 2021-08-08 ENCOUNTER — Ambulatory Visit (HOSPITAL_BASED_OUTPATIENT_CLINIC_OR_DEPARTMENT_OTHER): Payer: 59 | Admitting: Obstetrics & Gynecology

## 2021-08-08 ENCOUNTER — Other Ambulatory Visit (HOSPITAL_COMMUNITY)
Admission: RE | Admit: 2021-08-08 | Discharge: 2021-08-08 | Disposition: A | Discharge status: Home / Self Care | Payer: 59 | Source: Ambulatory Visit | Attending: Obstetrics & Gynecology | Admitting: Obstetrics & Gynecology

## 2021-08-08 VITALS — BP 121/73 | HR 64 | Ht 64.0 in | Wt 281.6 lb

## 2021-08-08 DIAGNOSIS — N970 Female infertility associated with anovulation: Secondary | ICD-10-CM

## 2021-08-08 DIAGNOSIS — E282 Polycystic ovarian syndrome: Secondary | ICD-10-CM

## 2021-08-08 DIAGNOSIS — Z01419 Encounter for gynecological examination (general) (routine) without abnormal findings: Secondary | ICD-10-CM | POA: Diagnosis not present

## 2021-08-08 DIAGNOSIS — Q519 Congenital malformation of uterus and cervix, unspecified: Secondary | ICD-10-CM | POA: Diagnosis not present

## 2021-08-08 DIAGNOSIS — Z124 Encounter for screening for malignant neoplasm of cervix: Secondary | ICD-10-CM | POA: Insufficient documentation

## 2021-08-08 DIAGNOSIS — N132 Hydronephrosis with renal and ureteral calculous obstruction: Secondary | ICD-10-CM | POA: Diagnosis not present

## 2021-08-08 DIAGNOSIS — E559 Vitamin D deficiency, unspecified: Secondary | ICD-10-CM | POA: Diagnosis not present

## 2021-08-08 DIAGNOSIS — N201 Calculus of ureter: Secondary | ICD-10-CM | POA: Diagnosis not present

## 2021-08-08 NOTE — Progress Notes (Signed)
30 y.o. G0P0000 Married White or Caucasian female here for annual exam/new patient exam.  Hasn't had a gyn exam in a few years.  She is seeing Dr. April Manson.  She is working on weight loss.  She is on Ozempic.  Goal is to take this for 6 months.  She is down 10 pounds.    Patient's last menstrual period was 07/04/2021.          Sexually active: Yes.    The current method of family planning is none.    Exercising: Yes.     Swimming at the [employer] Smoker:  no  Health Maintenance: Pap:  obtained today History of abnormal Pap:  no MMG:  guidelines reviewed Colonoscopy:  guidelines reviewed TDaP:  07/01/2012 Screening Labs: done 06/2021   reports that she has never smoked. She has never used smokeless tobacco. She reports that she does not currently use alcohol. She reports that she does not use drugs.  Past Medical History:  Diagnosis Date   Adenopathy, Left Cervical    Amenorrhea    Back pain    Female infertility    GERD (gastroesophageal reflux disease)    History of Epstein-Barr virus infection    Kidney stone    Migraines    Morbid (severe) obesity due to excess calories (HCC) 02/03/2017   PCOS (polycystic ovarian syndrome)    Pericarditis 11/11/2015   Vitamin D deficiency 02/03/2017    Past Surgical History:  Procedure Laterality Date   HYSTEROSCOPY  10/2020   LYMPH NODE BIOPSY Left 12/11/2014   Procedure: LYMPH NODE BIOPSY;  Surgeon: Flo Shanks, MD;  Location: [facility];  Service: ENT;  Laterality: Left;   WISDOM TOOTH EXTRACTION      Current Outpatient Medications  Medication Sig Dispense Refill   Cholecalciferol (VITAMIN D3) 1.25 MG (50000 UT) CAPS Take 1 capsule by mouth once a week for 8 weeks, then take 1 capsule by mouth every other week for 8 weeks. 4 capsule 3   Semaglutide,0.25 or 0.5MG /DOS, (OZEMPIC, 0.25 OR 0.5 MG/DOSE,) 2 MG/1.5ML SOPN Inject 0.5 mg into the skin once a week. 6 mL 2   SYRINGE-NEEDLE, DISP, 3 ML (B-D 3CC LUER-LOK SYR 18GX1-1/2)  18G X 1-1/2" 3 ML MISC Use to mix HCG 1 each 2   acetaminophen (TYLENOL) 325 MG tablet Take 650 mg by mouth every 6 (six) hours as needed.     human chorionic gonadotropin (PREGNYL) 24580 units injection Dilute with 71ml of diluent and inject 48ml  subcutaneously when instructed (Patient not taking: Reported on 08/08/2021) 1 each 2   letrozole (FEMARA) 2.5 MG tablet Take 3 tablets (7.5 mg total) by mouth daily on menstrual cycle days 3-7 (Patient not taking: Reported on 08/08/2021) 15 tablet 3   metFORMIN (GLUCOPHAGE) 1000 MG tablet Take 1 tablet (1,000 mg total) by mouth 2 (two) times daily. (Patient not taking: Reported on 08/08/2021) 180 tablet 3   Prenatal Vit-Fe Fumarate-FA (PRENATAL MULTIVITAMIN) TABS tablet Take 1 tablet by mouth daily at 12 noon. (Patient not taking: Reported on 08/08/2021)     Semaglutide,0.25 or 0.5MG /DOS, (OZEMPIC, 0.25 OR 0.5 MG/DOSE,) 2 MG/1.5ML SOPN Inject 0.25 mg into the skin once a week for week 1-4, THEN 0.5 mg once a week starting week 5 4.5 mL 3   No current facility-administered medications for this visit.    Family History  Problem Relation Age of Onset   Diabetes Mother    Hypertension Mother    High Cholesterol Mother  Thyroid disease Mother    Obesity Mother    Obesity Father    Cancer Maternal Grandmother        Hodgkin's Lymphoma   Diabetes Paternal Grandmother    Heart disease Paternal Grandmother    Heart disease Paternal Grandfather    Polycystic ovary syndrome Paternal Aunt     Review of Systems  All other systems reviewed and are negative.  Exam:   BP 121/73 (BP Location: Right Arm, Patient Position: Sitting, Cuff Size: Large)   Pulse 64   Ht 5\' 4"  (1.626 m) Comment: reported  Wt 281 lb 9.6 oz (127.7 kg)   LMP 07/04/2021   BMI 48.34 kg/m   Height: 5\' 4"  (162.6 cm) (reported)  General appearance: alert, cooperative and appears stated age Head: Normocephalic, without obvious abnormality, atraumatic Neck: no adenopathy, supple,  symmetrical, trachea midline and thyroid normal to inspection and palpation Lungs: clear to auscultation bilaterally Breasts: normal appearance, no masses or tenderness Heart: regular rate and rhythm Abdomen: soft, non-tender; bowel sounds normal; no masses,  no organomegaly Extremities: extremities normal, atraumatic, no cyanosis or edema Skin: Skin color, texture, turgor normal. No rashes or lesions Lymph nodes: Cervical, supraclavicular, and axillary nodes normal. No abnormal inguinal nodes palpated Neurologic: Grossly normal   Pelvic: External genitalia:  no lesions              Urethra:  normal appearing urethra with no masses, tenderness or lesions              Bartholins and Skenes: normal                 Vagina: normal appearing vagina with normal color and no discharge, no lesions              Cervix: no lesions              Pap taken: Yes.   Bimanual Exam:  Uterus:  normal size, contour, position, consistency, mobility, non-tender              Adnexa: normal adnexa and no mass, fullness, tenderness               Rectovaginal: Confirms               Anus:  normal sphincter tone, no lesions  Chaperone, 07/06/2021, CMA, was present for exam.  Assessment/Plan: 1. Well woman exam with routine gynecological exam - pap with HR HPV obtained today - breast and colon cancer screening guidelines reviewed - vaccines reviewed/updated, care gaps updated  2. Cervical cancer screening - Cytology - PAP( [facility])  3. Polycystic ovarian syndrome - on metformin - working on weight loss with Ozempic  4. Vitamin D deficiency  5. Uterine anomaly - s/p resection  6. H/o anovulation - pt aware to call for progesterone challenge if hasn't cycles for 90 days

## 2021-08-12 LAB — CYTOLOGY - PAP
Comment: NEGATIVE
Diagnosis: NEGATIVE
High risk HPV: NEGATIVE

## 2021-08-27 ENCOUNTER — Encounter (INDEPENDENT_AMBULATORY_CARE_PROVIDER_SITE_OTHER): Payer: Self-pay

## 2021-08-27 ENCOUNTER — Ambulatory Visit (INDEPENDENT_AMBULATORY_CARE_PROVIDER_SITE_OTHER): Payer: 59 | Admitting: Physician Assistant

## 2021-09-04 ENCOUNTER — Ambulatory Visit: Payer: 59 | Admitting: Internal Medicine

## 2021-09-04 ENCOUNTER — Other Ambulatory Visit (HOSPITAL_COMMUNITY): Payer: Self-pay

## 2021-09-04 MED ORDER — OZEMPIC (1 MG/DOSE) 4 MG/3ML ~~LOC~~ SOPN
1.0000 mg | PEN_INJECTOR | SUBCUTANEOUS | 3 refills | Status: DC
  Filled 2021-09-04: qty 3, 28d supply, fill #0

## 2021-09-04 NOTE — Progress Notes (Deleted)
**Note Jacqueline-Identified via Obfuscation** Name: Jacqueline [name]  MRN/ DOB: [id number], 1991/05/31    Age/ Sex: 30 y.o., female    PCP: Jacqueline Peru, Jacqueline Kos, MD   Reason for Endocrinology Evaluation: PCOS     Date of Initial Endocrinology Evaluation: 09/04/2021     HPI: Ms. Jacqueline [name] is a 30 y.o. female with a past medical history of ***. The patient presented for initial endocrinology clinic visit on 09/04/2021 for consultative assistance with her PCOS.   She was evaluated by Gyn in 2019 for PCOS. She was noted to have normal 17-OH progesterone, DHEAS, TFT's , prolactin but with slight elevation of testosterone at 67 ng/dL ( reference 0-97)    Of note, the pt follows with [facility]   She has been diagnosed with pre-diabetes , she is on Metformin and Ozempic       HISTORY:  Past Medical History:  Past Medical History:  Diagnosis Date   Adenopathy, Left Cervical    Amenorrhea    Back pain    Female infertility    GERD (gastroesophageal reflux disease)    History of Epstein-Barr virus infection    Kidney stone    Migraines    Morbid (severe) obesity due to excess calories (HCC) 02/03/2017   PCOS (polycystic ovarian syndrome)    Pericarditis 11/11/2015   Vitamin D deficiency 02/03/2017   Past Surgical History:  Past Surgical History:  Procedure Laterality Date   HYSTEROSCOPY  10/2020   with septum resection   LYMPH NODE BIOPSY Left 12/11/2014   Procedure: LYMPH NODE BIOPSY;  Surgeon: Jacqueline Shanks, MD;  Location: [facility];  Service: ENT;  Laterality: Left;   WISDOM TOOTH EXTRACTION      Social History:  reports that she has never smoked. She has never used smokeless tobacco. She reports that she does not currently use alcohol. She reports that she does not use drugs. Family History: family history includes Cancer in her maternal grandmother; Cancer (age of onset: 87) in her paternal grandmother; Diabetes in her mother and paternal grandmother; Heart disease in  her paternal grandfather and paternal grandmother; High Cholesterol in her mother; Hypertension in her mother; Obesity in her father and mother; Polycystic ovary syndrome in her paternal aunt; Thyroid disease in her mother.   HOME MEDICATIONS: Allergies as of 09/04/2021       Reactions   Colchicine Diarrhea, Other (See Comments)   DIARRHEA AND MOUTH SORES   Indomethacin Other (See Comments)   Blood in stool   Phentermine Rash        Medication List        Accurate as of September 04, 2021  7:28 AM. If you have any questions, ask your nurse or doctor.          B-D 3CC LUER-LOK SYR 18GX1-1/2 18G X 1-1/2" 3 ML Misc Generic drug: SYRINGE-NEEDLE (DISP) 3 ML Use to mix HCG   D3-50 1.25 MG (50000 UT) capsule Generic drug: Cholecalciferol Take 1 capsule by mouth once a week for 8 weeks, then take 1 capsule by mouth every other week for 8 weeks.   human chorionic gonadotropin 35329 units injection Commonly known as: Pregnyl Dilute with 36ml of diluent and inject 13ml  subcutaneously when instructed   letrozole 2.5 MG tablet Commonly known as: FEMARA Take 3 tablets (7.5 mg total) by mouth daily on menstrual cycle days 3-7   metFORMIN 1000 MG tablet Commonly known as: GLUCOPHAGE Take 1 tablet (1,000 mg total) by  mouth 2 (two) times daily.   Ozempic (0.25 or 0.5 MG/DOSE) 2 MG/1.5ML Sopn Generic drug: Semaglutide(0.25 or 0.5MG /DOS) Inject 0.5 mg into the skin once a week.   prenatal multivitamin Tabs tablet Take 1 tablet by mouth daily at 12 noon.          REVIEW OF SYSTEMS: A comprehensive ROS was conducted with the patient and is negative except as per HPI and below:  ROS     OBJECTIVE:  VS: There were no vitals taken for this visit.   Wt Readings from Last 3 Encounters:  08/08/21 281 lb 9.6 oz (127.7 kg)  07/31/21 278 lb (126.1 kg)  07/09/21 286 lb (129.7 kg)     EXAM: General: Pt appears well and is in NAD  Hydration: Well-hydrated with moist mucous  membranes and good skin turgor  Eyes: External eye exam normal without stare, lid lag or exophthalmos.  EOM intact.  PERRL.  Ears, Nose, Throat: Hearing: Grossly intact bilaterally Dental: Good dentition  Throat: Clear without mass, erythema or exudate  Neck: General: Supple without adenopathy. Thyroid: Thyroid size normal.  No goiter or nodules appreciated. No thyroid bruit.  Lungs: Clear with good BS bilat with no rales, rhonchi, or wheezes  Heart: Auscultation: RRR.  Abdomen: Normoactive bowel sounds, soft, nontender, without masses or organomegaly palpable  Extremities: Gait and station: Normal gait  Digits and nails: No clubbing, cyanosis, petechiae, or nodes Head and neck: Normal alignment and mobility BL UE: Normal ROM and strength. BL LE: No pretibial edema normal ROM and strength.  Skin: Hair: Texture and amount normal with gender appropriate distribution Skin Inspection: No rashes, acanthosis nigricans/skin tags. No lipohypertrophy Skin Palpation: Skin temperature, texture, and thickness normal to palpation  Neuro: Cranial nerves: II - XII grossly intact  Cerebellar: Normal coordination and movement; no tremor Motor: Normal strength throughout DTRs: 2+ and symmetric in UE without delay in relaxation phase  Mental Status: Judgment, insight: Intact Orientation: Oriented to time, place, and person Memory: Intact for recent and remote events Mood and affect: No depression, anxiety, or agitation     DATA REVIEWED: ***    ASSESSMENT/PLAN/RECOMMENDATIONS:   ***    Medications :  Signed electronically by: Jacqueline Herrlich, MD  [facility] Endocrinology  [facility] [address] [place], [place] [address] Phone: [phone] FAX: [phone]   CC: Jacqueline Peru, Raymond J, MD [address] [place] [address] Phone: [phone] Fax: [phone]   Return to Endocrinology clinic as below: Future Appointments  Date Time  Provider Department Center  09/04/2021 10:10 AM Jacqueline [name], Jacqueline Dolores, MD [facility] None  09/23/2021  1:10 PM Jacqueline Peru, Raymond J, MD [facility]  10/01/2021  9:00 AM Jacqueline Rima, Jacqueline [name] [facility] None

## 2021-09-12 ENCOUNTER — Other Ambulatory Visit (HOSPITAL_COMMUNITY): Payer: Self-pay

## 2021-09-12 ENCOUNTER — Other Ambulatory Visit (HOSPITAL_BASED_OUTPATIENT_CLINIC_OR_DEPARTMENT_OTHER): Payer: Self-pay | Admitting: Obstetrics & Gynecology

## 2021-09-12 ENCOUNTER — Encounter (HOSPITAL_BASED_OUTPATIENT_CLINIC_OR_DEPARTMENT_OTHER): Payer: Self-pay

## 2021-09-12 DIAGNOSIS — N201 Calculus of ureter: Secondary | ICD-10-CM | POA: Diagnosis not present

## 2021-09-12 MED ORDER — MEDROXYPROGESTERONE ACETATE 10 MG PO TABS
10.0000 mg | ORAL_TABLET | Freq: Every day | ORAL | 0 refills | Status: DC
  Filled 2021-09-12: qty 10, 10d supply, fill #0

## 2021-09-13 ENCOUNTER — Other Ambulatory Visit (HOSPITAL_COMMUNITY): Payer: Self-pay

## 2021-09-23 ENCOUNTER — Ambulatory Visit (HOSPITAL_BASED_OUTPATIENT_CLINIC_OR_DEPARTMENT_OTHER): Payer: 59 | Admitting: Family Medicine

## 2021-09-23 ENCOUNTER — Other Ambulatory Visit (HOSPITAL_COMMUNITY): Payer: Self-pay

## 2021-09-23 MED ORDER — OZEMPIC (1 MG/DOSE) 4 MG/3ML ~~LOC~~ SOPN
1.0000 mg | PEN_INJECTOR | SUBCUTANEOUS | 3 refills | Status: DC
  Filled 2021-09-23: qty 3, 28d supply, fill #0

## 2021-09-27 ENCOUNTER — Other Ambulatory Visit (HOSPITAL_COMMUNITY): Payer: Self-pay

## 2021-09-30 ENCOUNTER — Encounter (INDEPENDENT_AMBULATORY_CARE_PROVIDER_SITE_OTHER): Payer: Self-pay | Admitting: Family Medicine

## 2021-10-01 ENCOUNTER — Other Ambulatory Visit (HOSPITAL_COMMUNITY): Payer: Self-pay

## 2021-10-01 ENCOUNTER — Ambulatory Visit (INDEPENDENT_AMBULATORY_CARE_PROVIDER_SITE_OTHER): Payer: 59 | Admitting: Family Medicine

## 2021-10-01 MED ORDER — OZEMPIC (1 MG/DOSE) 4 MG/3ML ~~LOC~~ SOPN
1.0000 mg | PEN_INJECTOR | SUBCUTANEOUS | 3 refills | Status: DC
  Filled 2021-10-01 – 2021-10-07 (×3): qty 3, 28d supply, fill #0
  Filled 2021-10-30: qty 3, 28d supply, fill #1

## 2021-10-02 ENCOUNTER — Other Ambulatory Visit (HOSPITAL_COMMUNITY): Payer: Self-pay

## 2021-10-04 ENCOUNTER — Other Ambulatory Visit (HOSPITAL_COMMUNITY): Payer: Self-pay

## 2021-10-07 ENCOUNTER — Other Ambulatory Visit (HOSPITAL_COMMUNITY): Payer: Self-pay

## 2021-10-08 ENCOUNTER — Other Ambulatory Visit (HOSPITAL_COMMUNITY): Payer: Self-pay

## 2021-10-19 ENCOUNTER — Encounter (HOSPITAL_BASED_OUTPATIENT_CLINIC_OR_DEPARTMENT_OTHER): Payer: Self-pay | Admitting: Obstetrics & Gynecology

## 2021-10-30 ENCOUNTER — Other Ambulatory Visit (HOSPITAL_COMMUNITY): Payer: Self-pay

## 2021-11-01 ENCOUNTER — Other Ambulatory Visit (HOSPITAL_BASED_OUTPATIENT_CLINIC_OR_DEPARTMENT_OTHER): Payer: Self-pay | Admitting: Obstetrics & Gynecology

## 2021-11-01 ENCOUNTER — Other Ambulatory Visit (HOSPITAL_COMMUNITY): Payer: Self-pay

## 2021-11-01 MED ORDER — NORETHINDRONE ACETATE 5 MG PO TABS
5.0000 mg | ORAL_TABLET | Freq: Every day | ORAL | 0 refills | Status: DC
  Filled 2021-11-01: qty 21, 28d supply, fill #0
  Filled 2022-01-30: qty 21, 21d supply, fill #0

## 2021-11-06 ENCOUNTER — Other Ambulatory Visit (HOSPITAL_COMMUNITY): Payer: Self-pay

## 2021-11-06 MED ORDER — MOUNJARO 7.5 MG/0.5ML ~~LOC~~ SOAJ
7.5000 mg | SUBCUTANEOUS | 3 refills | Status: DC
  Filled 2021-11-06: qty 2, 28d supply, fill #0

## 2021-11-13 ENCOUNTER — Other Ambulatory Visit: Payer: Self-pay

## 2021-11-13 ENCOUNTER — Encounter (HOSPITAL_BASED_OUTPATIENT_CLINIC_OR_DEPARTMENT_OTHER): Payer: Self-pay | Admitting: Family Medicine

## 2021-11-13 ENCOUNTER — Ambulatory Visit (HOSPITAL_BASED_OUTPATIENT_CLINIC_OR_DEPARTMENT_OTHER): Payer: 59 | Admitting: Family Medicine

## 2021-11-13 DIAGNOSIS — M25512 Pain in left shoulder: Secondary | ICD-10-CM | POA: Diagnosis not present

## 2021-11-13 NOTE — Assessment & Plan Note (Addendum)
Patient presents with 1 month history of left shoulder pain.  She denies any injury or inciting event.  She does report that she has been swimming a lot more recently and wonders if this could have led to her current pain.  Pain is primarily worse with abduction, overhead motion, reaching behind her back.  She has tried Tylenol with some relief.  Denies any associated numbness or tingling.  She denies any prior issues.  Patient is right-handed. On exam, Left shoulder: Obvious swelling, bruising or erythema: absent Deformity of the shoulder: absent Active ROM: full range with pain Passive ROM: full range with pain Strength: normal/normal Empty can: Positive Hawkins: Negative Neer's: Positive Neurovascular exam: intact Feel the symptoms most likely are related to impingement, possible rotator cuff strain Recommend referral to physical therapy for further evaluation and management, home exercise program as per PT Can continue with OTC measures including Tylenol, icing, heat Plan for follow-up in about 2 months to monitor progress If not improving as expected, consider imaging

## 2021-11-13 NOTE — Progress Notes (Signed)
° ° °  Procedures performed today:    None.  Independent interpretation of notes and tests performed by another provider:   None.  Brief History, Exam, Impression, and Recommendations:    BP 108/74    Pulse (!) 105    Ht 5\' 4"  (1.626 m)    Wt 268 lb (121.6 kg)    SpO2 100%    BMI 46.00 kg/m   Left shoulder pain Patient presents with 1 month history of left shoulder pain.  She denies any injury or inciting event.  She does report that she has been swimming a lot more recently and wonders if this could have led to her current pain.  Pain is primarily worse with abduction, overhead motion, reaching behind her back.  She has tried Tylenol with some relief.  Denies any associated numbness or tingling.  She denies any prior issues.  Patient is right-handed. On exam, Left shoulder: Obvious swelling, bruising or erythema: absent Deformity of the shoulder: absent Active ROM: full range with pain Passive ROM: full range with pain Strength: normal/normal Empty can: Positive Hawkins: Negative Neer's: Positive Neurovascular exam: intact Feel the symptoms most likely are related to impingement, possible rotator cuff strain Recommend referral to physical therapy for further evaluation and management, home exercise program as per PT Can continue with OTC measures including Tylenol, icing, heat Plan for follow-up in about 2 months to monitor progress If not improving as expected, consider imaging   ___________________________________________ Latroy Gaymon de , MD, ABFM, CAQSM Primary Care and Sports Medicine [facility]

## 2021-11-13 NOTE — Patient Instructions (Signed)
°  Medication Instructions:  Your physician recommends that you continue on your current medications as directed. Please refer to the Current Medication list given to you today. --If you need a refill on any your medications before your next appointment, please call your pharmacy first. If no refills are authorized on file call the office.--  Referrals/Procedures/Imaging: A referral has been placed for you to [facility] for evaluation and treatment. Someone from the scheduling department will be in contact with you in regards to coordinating your consultation. If you do not hear from any of the schedulers within 7-10 business days please give their office a call.  Follow-Up: Your next appointment:   Your physician recommends that you schedule a follow-up appointment in: 2 MONTHS with Dr. de [place]  You will receive a text message or e-mail with a link to a survey about your care and experience with [place] today! We would greatly appreciate your feedback!   Thanks for letting us be apart of your health journey!!  Primary Care and Sports Medicine   Dr. Arlina Robes [place]   We encourage you to activate your patient portal called "MyChart".  Sign up information is provided on this After Visit Summary.  MyChart is used to connect with patients for Virtual Visits (Telemedicine).  Patients are able to view lab/test results, encounter notes, upcoming appointments, etc.  Non-urgent messages can be sent to your provider as well. To learn more about what you can do with MyChart, please visit --  [email or web address].

## 2021-11-14 ENCOUNTER — Other Ambulatory Visit (HOSPITAL_COMMUNITY): Payer: Self-pay

## 2021-11-26 ENCOUNTER — Other Ambulatory Visit (HOSPITAL_COMMUNITY): Payer: Self-pay

## 2021-11-26 MED ORDER — MOUNJARO 10 MG/0.5ML ~~LOC~~ SOAJ
10.0000 mg | SUBCUTANEOUS | 2 refills | Status: DC
Start: 2021-11-26 — End: 2022-06-10
  Filled 2021-11-26: qty 2, 28d supply, fill #0
  Filled 2021-12-13 (×2): qty 2, 28d supply, fill #1

## 2021-12-13 ENCOUNTER — Other Ambulatory Visit (HOSPITAL_COMMUNITY): Payer: Self-pay

## 2021-12-13 MED ORDER — MEDROXYPROGESTERONE ACETATE 10 MG PO TABS
10.0000 mg | ORAL_TABLET | Freq: Every day | ORAL | 0 refills | Status: DC
  Filled 2021-12-13: qty 10, 10d supply, fill #0

## 2021-12-17 ENCOUNTER — Other Ambulatory Visit (HOSPITAL_COMMUNITY): Payer: Self-pay

## 2021-12-23 ENCOUNTER — Other Ambulatory Visit (HOSPITAL_COMMUNITY): Payer: Self-pay

## 2021-12-25 ENCOUNTER — Other Ambulatory Visit (HOSPITAL_COMMUNITY): Payer: Self-pay

## 2022-01-10 ENCOUNTER — Other Ambulatory Visit (HOSPITAL_COMMUNITY): Payer: Self-pay

## 2022-01-10 MED ORDER — MOUNJARO 12.5 MG/0.5ML ~~LOC~~ SOAJ
12.5000 mg | SUBCUTANEOUS | 2 refills | Status: DC
  Filled 2022-01-10: qty 2, 28d supply, fill #0
  Filled 2022-01-21 – 2022-02-03 (×4): qty 2, 28d supply, fill #1
  Filled 2022-02-03: qty 2, 28d supply, fill #0
  Filled 2022-02-18: qty 2, 28d supply, fill #1

## 2022-01-21 ENCOUNTER — Other Ambulatory Visit (HOSPITAL_COMMUNITY): Payer: Self-pay

## 2022-01-22 ENCOUNTER — Ambulatory Visit: Payer: 59 | Admitting: Internal Medicine

## 2022-01-22 ENCOUNTER — Other Ambulatory Visit (HOSPITAL_COMMUNITY): Payer: Self-pay

## 2022-01-27 ENCOUNTER — Other Ambulatory Visit (HOSPITAL_COMMUNITY): Payer: Self-pay

## 2022-01-28 ENCOUNTER — Other Ambulatory Visit (HOSPITAL_COMMUNITY): Payer: Self-pay

## 2022-01-29 ENCOUNTER — Other Ambulatory Visit (HOSPITAL_COMMUNITY): Payer: Self-pay

## 2022-01-30 ENCOUNTER — Ambulatory Visit (HOSPITAL_BASED_OUTPATIENT_CLINIC_OR_DEPARTMENT_OTHER): Payer: 59 | Admitting: Family Medicine

## 2022-01-30 ENCOUNTER — Other Ambulatory Visit (HOSPITAL_COMMUNITY): Payer: Self-pay

## 2022-01-30 DIAGNOSIS — Z113 Encounter for screening for infections with a predominantly sexual mode of transmission: Secondary | ICD-10-CM | POA: Diagnosis not present

## 2022-01-30 DIAGNOSIS — N981 Hyperstimulation of ovaries: Secondary | ICD-10-CM | POA: Diagnosis not present

## 2022-01-30 DIAGNOSIS — N970 Female infertility associated with anovulation: Secondary | ICD-10-CM | POA: Diagnosis not present

## 2022-01-30 DIAGNOSIS — Z319 Encounter for procreative management, unspecified: Secondary | ICD-10-CM | POA: Diagnosis not present

## 2022-01-30 DIAGNOSIS — E282 Polycystic ovarian syndrome: Secondary | ICD-10-CM | POA: Diagnosis not present

## 2022-01-30 DIAGNOSIS — Z3141 Encounter for fertility testing: Secondary | ICD-10-CM | POA: Diagnosis not present

## 2022-01-30 DIAGNOSIS — Z31430 Encounter of female for testing for genetic disease carrier status for procreative management: Secondary | ICD-10-CM | POA: Diagnosis not present

## 2022-01-30 MED ORDER — NORETHINDRONE ACETATE 5 MG PO TABS: 5.0000 mg | ORAL_TABLET | Freq: Every day | ORAL | 0 refills | Status: DC

## 2022-01-30 MED ORDER — DOXYCYCLINE HYCLATE 100 MG PO CAPS
100.0000 mg | ORAL_CAPSULE | Freq: Two times a day (BID) | ORAL | 0 refills | Status: DC
  Filled 2022-01-30: qty 10, 5d supply, fill #0

## 2022-01-31 ENCOUNTER — Other Ambulatory Visit (HOSPITAL_COMMUNITY): Payer: Self-pay

## 2022-01-31 MED ORDER — "NEEDLE (DISP) 30G X 1/2"" MISC"
2 refills | Status: DC
  Filled 2022-01-31: qty 30, 30d supply, fill #0

## 2022-01-31 MED ORDER — CETRORELIX ACETATE 0.25 MG ~~LOC~~ KIT
PACK | SUBCUTANEOUS | 1 refills | Status: DC
Start: 2022-01-31 — End: 2023-03-15
  Filled 2022-01-31 (×2): qty 5, 5d supply, fill #0

## 2022-01-31 MED ORDER — METHYLPREDNISOLONE 8 MG PO TABS
8.0000 mg | ORAL_TABLET | Freq: Two times a day (BID) | ORAL | 0 refills | Status: DC
  Filled 2022-01-31: qty 8, 4d supply, fill #0

## 2022-01-31 MED ORDER — ESTRADIOL 2 MG PO TABS
2.0000 mg | ORAL_TABLET | Freq: Two times a day (BID) | ORAL | 2 refills | Status: DC
  Filled 2022-01-31: qty 60, 30d supply, fill #0
  Filled 2022-05-23: qty 60, 30d supply, fill #1
  Filled 2022-06-29: qty 60, 30d supply, fill #2

## 2022-01-31 MED ORDER — PROGESTERONE 50 MG/ML IM OIL
50.0000 mg | TOPICAL_OIL | Freq: Every day | INTRAMUSCULAR | 2 refills | Status: DC
  Filled 2022-01-31: qty 30, 30d supply, fill #0
  Filled 2022-06-29: qty 30, 30d supply, fill #1

## 2022-01-31 MED ORDER — GANIRELIX ACETATE 250 MCG/0.5ML ~~LOC~~ SOSY
250.0000 ug | PREFILLED_SYRINGE | Freq: Every day | SUBCUTANEOUS | 1 refills | Status: DC
  Filled 2022-01-31: qty 2.5, 5d supply, fill #0

## 2022-01-31 MED ORDER — FOLLITROPIN ALFA 900 UNIT/1.5ML ~~LOC~~ SOPN
300.0000 [IU] | PEN_INJECTOR | Freq: Every day | SUBCUTANEOUS | 2 refills | Status: DC
  Filled 2022-01-31: qty 4.5, 9d supply, fill #0

## 2022-01-31 MED ORDER — DOXYCYCLINE HYCLATE 100 MG PO TABS
100.0000 mg | ORAL_TABLET | Freq: Two times a day (BID) | ORAL | 0 refills | Status: DC
  Filled 2022-01-31: qty 40, 20d supply, fill #0

## 2022-01-31 MED ORDER — NOVAREL 5000 UNITS IM SOLR
INTRAMUSCULAR | 0 refills | Status: DC
Start: 2022-01-31 — End: 2023-03-15
  Filled 2022-01-31: qty 2, 1d supply, fill #0

## 2022-01-31 MED ORDER — "SYRINGE/NEEDLE (DISP) 18G X 1-1/2"" 3 ML MISC"
2 refills | Status: DC
  Filled 2022-01-31: qty 60, 30d supply, fill #0
  Filled 2022-07-04: qty 60, 30d supply, fill #1

## 2022-01-31 MED ORDER — MENOTROPINS 75 UNITS ~~LOC~~ SOLR
75.0000 [IU] | Freq: Every day | SUBCUTANEOUS | 1 refills | Status: DC
  Filled 2022-01-31: qty 10, 10d supply, fill #0

## 2022-01-31 MED ORDER — GANIRELIX ACETATE 250 MCG/0.5ML ~~LOC~~ SOSY
250.0000 ug | PREFILLED_SYRINGE | Freq: Every day | SUBCUTANEOUS | 1 refills | Status: DC
  Filled 2022-01-31 (×2): qty 2.5, 5d supply, fill #0

## 2022-01-31 MED ORDER — "NEEDLE (DISP) 23G X 1"" MISC"
2 refills | Status: DC
  Filled 2022-01-31 (×2): qty 30, 30d supply, fill #0
  Filled 2022-06-29: qty 30, 30d supply, fill #1

## 2022-02-03 ENCOUNTER — Other Ambulatory Visit (HOSPITAL_COMMUNITY): Payer: Self-pay

## 2022-02-03 ENCOUNTER — Other Ambulatory Visit (HOSPITAL_BASED_OUTPATIENT_CLINIC_OR_DEPARTMENT_OTHER): Payer: Self-pay

## 2022-02-04 ENCOUNTER — Other Ambulatory Visit (HOSPITAL_COMMUNITY): Payer: Self-pay

## 2022-02-06 ENCOUNTER — Encounter (HOSPITAL_BASED_OUTPATIENT_CLINIC_OR_DEPARTMENT_OTHER): Payer: Self-pay | Admitting: Family Medicine

## 2022-02-07 ENCOUNTER — Other Ambulatory Visit (HOSPITAL_COMMUNITY): Payer: Self-pay

## 2022-02-07 DIAGNOSIS — Z3183 Encounter for assisted reproductive fertility procedure cycle: Secondary | ICD-10-CM | POA: Diagnosis not present

## 2022-02-07 MED ORDER — ESTRADIOL 0.1 MG/24HR TD PTTW
1.0000 | MEDICATED_PATCH | TRANSDERMAL | 4 refills | Status: DC
  Filled 2022-02-07: qty 8, 28d supply, fill #0
  Filled 2022-05-05: qty 8, 28d supply, fill #1
  Filled 2022-05-07: qty 24, 84d supply, fill #0
  Filled 2022-05-07: qty 24, 84d supply, fill #1

## 2022-02-08 HISTORY — PX: OVUM / OOCYTE RETRIEVAL: SUR1269

## 2022-02-10 ENCOUNTER — Other Ambulatory Visit (HOSPITAL_COMMUNITY): Payer: Self-pay

## 2022-02-12 DIAGNOSIS — Z3183 Encounter for assisted reproductive fertility procedure cycle: Secondary | ICD-10-CM | POA: Diagnosis not present

## 2022-02-13 ENCOUNTER — Other Ambulatory Visit (HOSPITAL_COMMUNITY): Payer: Self-pay

## 2022-02-13 DIAGNOSIS — Z31430 Encounter of female for testing for genetic disease carrier status for procreative management: Secondary | ICD-10-CM | POA: Diagnosis not present

## 2022-02-13 DIAGNOSIS — N970 Female infertility associated with anovulation: Secondary | ICD-10-CM | POA: Diagnosis not present

## 2022-02-13 DIAGNOSIS — Z113 Encounter for screening for infections with a predominantly sexual mode of transmission: Secondary | ICD-10-CM | POA: Diagnosis not present

## 2022-02-13 DIAGNOSIS — E282 Polycystic ovarian syndrome: Secondary | ICD-10-CM | POA: Diagnosis not present

## 2022-02-13 DIAGNOSIS — N981 Hyperstimulation of ovaries: Secondary | ICD-10-CM | POA: Diagnosis not present

## 2022-02-13 DIAGNOSIS — Z319 Encounter for procreative management, unspecified: Secondary | ICD-10-CM | POA: Diagnosis not present

## 2022-02-13 MED ORDER — NORETHINDRONE ACETATE 5 MG PO TABS
5.0000 mg | ORAL_TABLET | Freq: Every morning | ORAL | 2 refills | Status: DC
  Filled 2022-02-13: qty 30, 30d supply, fill #0

## 2022-02-14 ENCOUNTER — Other Ambulatory Visit (HOSPITAL_COMMUNITY): Payer: Self-pay

## 2022-02-17 ENCOUNTER — Other Ambulatory Visit (HOSPITAL_COMMUNITY): Payer: Self-pay

## 2022-02-18 DIAGNOSIS — E282 Polycystic ovarian syndrome: Secondary | ICD-10-CM | POA: Diagnosis not present

## 2022-02-18 DIAGNOSIS — N83292 Other ovarian cyst, left side: Secondary | ICD-10-CM | POA: Diagnosis not present

## 2022-02-18 DIAGNOSIS — Z31430 Encounter of female for testing for genetic disease carrier status for procreative management: Secondary | ICD-10-CM | POA: Diagnosis not present

## 2022-02-19 ENCOUNTER — Other Ambulatory Visit (HOSPITAL_COMMUNITY): Payer: Self-pay

## 2022-02-21 ENCOUNTER — Other Ambulatory Visit (HOSPITAL_COMMUNITY): Payer: Self-pay

## 2022-02-24 ENCOUNTER — Other Ambulatory Visit (HOSPITAL_COMMUNITY): Payer: Self-pay

## 2022-02-24 DIAGNOSIS — N981 Hyperstimulation of ovaries: Secondary | ICD-10-CM | POA: Diagnosis not present

## 2022-02-24 DIAGNOSIS — Z319 Encounter for procreative management, unspecified: Secondary | ICD-10-CM | POA: Diagnosis not present

## 2022-02-24 DIAGNOSIS — Z113 Encounter for screening for infections with a predominantly sexual mode of transmission: Secondary | ICD-10-CM | POA: Diagnosis not present

## 2022-02-24 DIAGNOSIS — Z3183 Encounter for assisted reproductive fertility procedure cycle: Secondary | ICD-10-CM | POA: Diagnosis not present

## 2022-02-24 DIAGNOSIS — Z31430 Encounter of female for testing for genetic disease carrier status for procreative management: Secondary | ICD-10-CM | POA: Diagnosis not present

## 2022-02-24 DIAGNOSIS — N970 Female infertility associated with anovulation: Secondary | ICD-10-CM | POA: Diagnosis not present

## 2022-02-24 DIAGNOSIS — E282 Polycystic ovarian syndrome: Secondary | ICD-10-CM | POA: Diagnosis not present

## 2022-02-25 ENCOUNTER — Other Ambulatory Visit (HOSPITAL_COMMUNITY): Payer: Self-pay

## 2022-02-27 DIAGNOSIS — Z3183 Encounter for assisted reproductive fertility procedure cycle: Secondary | ICD-10-CM | POA: Diagnosis not present

## 2022-03-03 DIAGNOSIS — Z3183 Encounter for assisted reproductive fertility procedure cycle: Secondary | ICD-10-CM | POA: Diagnosis not present

## 2022-03-05 DIAGNOSIS — Z3183 Encounter for assisted reproductive fertility procedure cycle: Secondary | ICD-10-CM | POA: Diagnosis not present

## 2022-03-07 DIAGNOSIS — Z3183 Encounter for assisted reproductive fertility procedure cycle: Secondary | ICD-10-CM | POA: Diagnosis not present

## 2022-03-07 DIAGNOSIS — N8500 Endometrial hyperplasia, unspecified: Secondary | ICD-10-CM | POA: Diagnosis not present

## 2022-03-07 DIAGNOSIS — Q5128 Other doubling of uterus, other specified: Secondary | ICD-10-CM | POA: Diagnosis not present

## 2022-03-07 DIAGNOSIS — N840 Polyp of corpus uteri: Secondary | ICD-10-CM | POA: Diagnosis not present

## 2022-03-12 DIAGNOSIS — Z3183 Encounter for assisted reproductive fertility procedure cycle: Secondary | ICD-10-CM | POA: Diagnosis not present

## 2022-03-13 DIAGNOSIS — Z3183 Encounter for assisted reproductive fertility procedure cycle: Secondary | ICD-10-CM | POA: Diagnosis not present

## 2022-04-02 ENCOUNTER — Other Ambulatory Visit (HOSPITAL_COMMUNITY): Payer: Self-pay

## 2022-04-02 DIAGNOSIS — N856 Intrauterine synechiae: Secondary | ICD-10-CM | POA: Diagnosis not present

## 2022-04-02 DIAGNOSIS — Q51810 Arcuate uterus: Secondary | ICD-10-CM | POA: Diagnosis not present

## 2022-04-02 DIAGNOSIS — E282 Polycystic ovarian syndrome: Secondary | ICD-10-CM | POA: Diagnosis not present

## 2022-04-02 MED ORDER — MEDROXYPROGESTERONE ACETATE 10 MG PO TABS
10.0000 mg | ORAL_TABLET | Freq: Every day | ORAL | 6 refills | Status: DC
  Filled 2022-04-02: qty 10, 10d supply, fill #0

## 2022-04-02 MED ORDER — DOXYCYCLINE HYCLATE 100 MG PO TABS
100.0000 mg | ORAL_TABLET | Freq: Two times a day (BID) | ORAL | 0 refills | Status: DC
  Filled 2022-04-02: qty 10, 5d supply, fill #0

## 2022-04-04 ENCOUNTER — Other Ambulatory Visit (HOSPITAL_COMMUNITY): Payer: Self-pay

## 2022-04-04 MED ORDER — MOUNJARO 15 MG/0.5ML ~~LOC~~ SOAJ
15.0000 mg | SUBCUTANEOUS | 0 refills | Status: DC
  Filled 2022-04-04: qty 2, 28d supply, fill #0
  Filled 2022-05-05 – 2022-06-29 (×2): qty 2, 28d supply, fill #1

## 2022-04-08 ENCOUNTER — Other Ambulatory Visit (HOSPITAL_COMMUNITY): Payer: Self-pay

## 2022-05-05 ENCOUNTER — Other Ambulatory Visit (HOSPITAL_COMMUNITY): Payer: Self-pay

## 2022-05-07 ENCOUNTER — Other Ambulatory Visit (HOSPITAL_COMMUNITY): Payer: Self-pay

## 2022-05-07 ENCOUNTER — Other Ambulatory Visit (HOSPITAL_BASED_OUTPATIENT_CLINIC_OR_DEPARTMENT_OTHER): Payer: Self-pay

## 2022-05-19 ENCOUNTER — Other Ambulatory Visit (HOSPITAL_BASED_OUTPATIENT_CLINIC_OR_DEPARTMENT_OTHER): Payer: Self-pay

## 2022-05-19 DIAGNOSIS — N856 Intrauterine synechiae: Secondary | ICD-10-CM | POA: Diagnosis not present

## 2022-05-19 DIAGNOSIS — Z3183 Encounter for assisted reproductive fertility procedure cycle: Secondary | ICD-10-CM | POA: Diagnosis not present

## 2022-05-20 ENCOUNTER — Other Ambulatory Visit (HOSPITAL_COMMUNITY): Payer: Self-pay

## 2022-05-20 MED ORDER — NORETHINDRONE ACETATE 5 MG PO TABS
5.0000 mg | ORAL_TABLET | Freq: Every day | ORAL | 0 refills | Status: DC
  Filled 2022-05-20: qty 30, 30d supply, fill #0

## 2022-05-21 ENCOUNTER — Other Ambulatory Visit (HOSPITAL_COMMUNITY): Payer: Self-pay

## 2022-05-23 ENCOUNTER — Other Ambulatory Visit (HOSPITAL_COMMUNITY): Payer: Self-pay

## 2022-05-28 ENCOUNTER — Other Ambulatory Visit (HOSPITAL_COMMUNITY): Payer: Self-pay

## 2022-06-02 ENCOUNTER — Other Ambulatory Visit (HOSPITAL_COMMUNITY): Payer: Self-pay

## 2022-06-02 DIAGNOSIS — N856 Intrauterine synechiae: Secondary | ICD-10-CM | POA: Diagnosis not present

## 2022-06-02 DIAGNOSIS — N840 Polyp of corpus uteri: Secondary | ICD-10-CM | POA: Diagnosis not present

## 2022-06-03 ENCOUNTER — Other Ambulatory Visit (HOSPITAL_COMMUNITY): Payer: Self-pay

## 2022-06-10 ENCOUNTER — Other Ambulatory Visit (HOSPITAL_COMMUNITY): Payer: Self-pay

## 2022-06-16 ENCOUNTER — Other Ambulatory Visit (HOSPITAL_COMMUNITY): Payer: Self-pay

## 2022-06-18 ENCOUNTER — Encounter (INDEPENDENT_AMBULATORY_CARE_PROVIDER_SITE_OTHER): Payer: Self-pay

## 2022-06-19 ENCOUNTER — Other Ambulatory Visit (HOSPITAL_COMMUNITY): Payer: Self-pay

## 2022-06-19 DIAGNOSIS — Z3183 Encounter for assisted reproductive fertility procedure cycle: Secondary | ICD-10-CM | POA: Diagnosis not present

## 2022-06-19 DIAGNOSIS — Z319 Encounter for procreative management, unspecified: Secondary | ICD-10-CM | POA: Diagnosis not present

## 2022-06-19 MED ORDER — VITAMIN E 180 MG (400 UNIT) PO CAPS
400.0000 [IU] | ORAL_CAPSULE | Freq: Every day | ORAL | 4 refills | Status: DC
  Filled 2022-06-19: qty 30, 30d supply, fill #0

## 2022-06-19 MED ORDER — PENTOXIFYLLINE ER 400 MG PO TBCR
400.0000 mg | EXTENDED_RELEASE_TABLET | Freq: Three times a day (TID) | ORAL | 1 refills | Status: DC
  Filled 2022-06-19: qty 270, 90d supply, fill #0

## 2022-06-23 ENCOUNTER — Other Ambulatory Visit (HOSPITAL_COMMUNITY): Payer: Self-pay

## 2022-06-27 DIAGNOSIS — Z3183 Encounter for assisted reproductive fertility procedure cycle: Secondary | ICD-10-CM | POA: Diagnosis not present

## 2022-06-29 ENCOUNTER — Other Ambulatory Visit (HOSPITAL_COMMUNITY): Payer: Self-pay

## 2022-06-30 ENCOUNTER — Other Ambulatory Visit (HOSPITAL_COMMUNITY): Payer: Self-pay

## 2022-07-03 ENCOUNTER — Other Ambulatory Visit (HOSPITAL_COMMUNITY): Payer: Self-pay

## 2022-07-03 MED ORDER — METFORMIN HCL 1000 MG PO TABS
1000.0000 mg | ORAL_TABLET | Freq: Two times a day (BID) | ORAL | 0 refills | Status: DC
  Filled 2022-07-03: qty 180, 90d supply, fill #0

## 2022-07-04 ENCOUNTER — Other Ambulatory Visit (HOSPITAL_COMMUNITY): Payer: Self-pay

## 2022-07-05 DIAGNOSIS — Z3183 Encounter for assisted reproductive fertility procedure cycle: Secondary | ICD-10-CM | POA: Diagnosis not present

## 2022-07-07 DIAGNOSIS — Z3200 Encounter for pregnancy test, result unknown: Secondary | ICD-10-CM | POA: Diagnosis not present

## 2022-07-18 DIAGNOSIS — Z3200 Encounter for pregnancy test, result unknown: Secondary | ICD-10-CM | POA: Diagnosis not present

## 2022-07-25 DIAGNOSIS — O200 Threatened abortion: Secondary | ICD-10-CM | POA: Diagnosis not present

## 2022-07-29 ENCOUNTER — Other Ambulatory Visit (HOSPITAL_COMMUNITY): Payer: Self-pay

## 2022-08-06 ENCOUNTER — Other Ambulatory Visit (HOSPITAL_COMMUNITY): Payer: Self-pay

## 2022-08-06 DIAGNOSIS — O0900 Supervision of pregnancy with history of infertility, unspecified trimester: Secondary | ICD-10-CM | POA: Diagnosis not present

## 2022-08-06 MED ORDER — PROGESTERONE 200 MG PO CAPS
200.0000 mg | ORAL_CAPSULE | Freq: Every day | ORAL | 0 refills | Status: DC
  Filled 2022-08-06: qty 14, 14d supply, fill #0

## 2022-08-13 LAB — OB RESULTS CONSOLE ABO/RH: RH Type: POSITIVE

## 2022-08-20 DIAGNOSIS — O09819 Supervision of pregnancy resulting from assisted reproductive technology, unspecified trimester: Secondary | ICD-10-CM | POA: Diagnosis not present

## 2022-08-20 DIAGNOSIS — O368990 Maternal care for other specified fetal problems, unspecified trimester, not applicable or unspecified: Secondary | ICD-10-CM | POA: Diagnosis not present

## 2022-08-20 DIAGNOSIS — Z3689 Encounter for other specified antenatal screening: Secondary | ICD-10-CM | POA: Diagnosis not present

## 2022-08-20 DIAGNOSIS — Z3A10 10 weeks gestation of pregnancy: Secondary | ICD-10-CM | POA: Diagnosis not present

## 2022-08-20 DIAGNOSIS — Z3400 Encounter for supervision of normal first pregnancy, unspecified trimester: Secondary | ICD-10-CM | POA: Diagnosis not present

## 2022-08-20 DIAGNOSIS — Z3401 Encounter for supervision of normal first pregnancy, first trimester: Secondary | ICD-10-CM | POA: Diagnosis not present

## 2022-08-20 DIAGNOSIS — O368910 Maternal care for other specified fetal problems, first trimester, not applicable or unspecified: Secondary | ICD-10-CM | POA: Diagnosis not present

## 2022-08-20 DIAGNOSIS — Z36 Encounter for antenatal screening for chromosomal anomalies: Secondary | ICD-10-CM | POA: Diagnosis not present

## 2022-08-20 LAB — OB RESULTS CONSOLE HEPATITIS B SURFACE ANTIGEN: Hepatitis B Surface Ag: NEGATIVE

## 2022-08-20 LAB — OB RESULTS CONSOLE HIV ANTIBODY (ROUTINE TESTING): HIV: NONREACTIVE

## 2022-08-20 LAB — OB RESULTS CONSOLE RUBELLA ANTIBODY, IGM: Rubella: IMMUNE

## 2022-08-20 LAB — OB RESULTS CONSOLE ANTIBODY SCREEN: Antibody Screen: NEGATIVE

## 2022-08-20 LAB — OB RESULTS CONSOLE RPR: RPR: NONREACTIVE

## 2022-08-29 LAB — OB RESULTS CONSOLE GC/CHLAMYDIA
Chlamydia: NEGATIVE
Neisseria Gonorrhea: NEGATIVE

## 2022-09-08 DIAGNOSIS — Z118 Encounter for screening for other infectious and parasitic diseases: Secondary | ICD-10-CM | POA: Diagnosis not present

## 2022-09-08 DIAGNOSIS — Z3A13 13 weeks gestation of pregnancy: Secondary | ICD-10-CM | POA: Diagnosis not present

## 2022-09-08 DIAGNOSIS — O09811 Supervision of pregnancy resulting from assisted reproductive technology, first trimester: Secondary | ICD-10-CM | POA: Diagnosis not present

## 2022-09-08 DIAGNOSIS — Z3689 Encounter for other specified antenatal screening: Secondary | ICD-10-CM | POA: Diagnosis not present

## 2022-09-24 DIAGNOSIS — O09812 Supervision of pregnancy resulting from assisted reproductive technology, second trimester: Secondary | ICD-10-CM | POA: Diagnosis not present

## 2022-09-24 DIAGNOSIS — E282 Polycystic ovarian syndrome: Secondary | ICD-10-CM | POA: Diagnosis not present

## 2022-09-24 DIAGNOSIS — Z3A15 15 weeks gestation of pregnancy: Secondary | ICD-10-CM | POA: Diagnosis not present

## 2022-09-24 DIAGNOSIS — Z361 Encounter for antenatal screening for raised alphafetoprotein level: Secondary | ICD-10-CM | POA: Diagnosis not present

## 2022-10-10 HISTORY — PX: UTERINE SEPTUM RESECTION: SHX5386

## 2022-10-24 DIAGNOSIS — Z3A19 19 weeks gestation of pregnancy: Secondary | ICD-10-CM | POA: Diagnosis not present

## 2022-10-24 DIAGNOSIS — O09812 Supervision of pregnancy resulting from assisted reproductive technology, second trimester: Secondary | ICD-10-CM | POA: Diagnosis not present

## 2022-10-24 DIAGNOSIS — Z363 Encounter for antenatal screening for malformations: Secondary | ICD-10-CM | POA: Diagnosis not present

## 2022-11-06 DIAGNOSIS — O09812 Supervision of pregnancy resulting from assisted reproductive technology, second trimester: Secondary | ICD-10-CM | POA: Diagnosis not present

## 2022-11-06 DIAGNOSIS — Z362 Encounter for other antenatal screening follow-up: Secondary | ICD-10-CM | POA: Diagnosis not present

## 2022-11-06 DIAGNOSIS — Z3A21 21 weeks gestation of pregnancy: Secondary | ICD-10-CM | POA: Diagnosis not present

## 2022-11-10 NOTE — L&D Delivery Note (Signed)
Delivery Note At 10:24 PM a viable and healthy female was delivered via Vaginal, Spontaneous (Presentation: Left Occiput Anterior).  APGAR: 6, 9; weight pending .   Placenta status: Manual removal, Intact.  Cord: 3 vessels with the following complications: None.  Cord pH: na  Anesthesia: Epidural Episiotomy:  na Lacerations: 2nd degree Suture Repair: 2.0 vicryl rapide Est. Blood Loss (mL): 291  Mom to postpartum.  Baby to Couplet care / Skin to Skin.  Nayellie Sanseverino J 03/12/2023, 11:06 PM

## 2022-11-19 DIAGNOSIS — Z3689 Encounter for other specified antenatal screening: Secondary | ICD-10-CM | POA: Diagnosis not present

## 2022-11-19 DIAGNOSIS — Z3A27 27 weeks gestation of pregnancy: Secondary | ICD-10-CM | POA: Diagnosis not present

## 2022-11-19 DIAGNOSIS — O09812 Supervision of pregnancy resulting from assisted reproductive technology, second trimester: Secondary | ICD-10-CM | POA: Diagnosis not present

## 2022-11-19 DIAGNOSIS — Z3400 Encounter for supervision of normal first pregnancy, unspecified trimester: Secondary | ICD-10-CM | POA: Diagnosis not present

## 2022-11-19 DIAGNOSIS — Z3A23 23 weeks gestation of pregnancy: Secondary | ICD-10-CM | POA: Diagnosis not present

## 2022-12-11 DIAGNOSIS — Z3400 Encounter for supervision of normal first pregnancy, unspecified trimester: Secondary | ICD-10-CM | POA: Diagnosis not present

## 2022-12-11 DIAGNOSIS — Z3A27 27 weeks gestation of pregnancy: Secondary | ICD-10-CM | POA: Diagnosis not present

## 2022-12-11 DIAGNOSIS — Z3689 Encounter for other specified antenatal screening: Secondary | ICD-10-CM | POA: Diagnosis not present

## 2022-12-11 DIAGNOSIS — O09812 Supervision of pregnancy resulting from assisted reproductive technology, second trimester: Secondary | ICD-10-CM | POA: Diagnosis not present

## 2022-12-12 DIAGNOSIS — Z3482 Encounter for supervision of other normal pregnancy, second trimester: Secondary | ICD-10-CM | POA: Diagnosis not present

## 2022-12-12 DIAGNOSIS — Z3483 Encounter for supervision of other normal pregnancy, third trimester: Secondary | ICD-10-CM | POA: Diagnosis not present

## 2022-12-16 DIAGNOSIS — O09812 Supervision of pregnancy resulting from assisted reproductive technology, second trimester: Secondary | ICD-10-CM | POA: Diagnosis not present

## 2022-12-16 DIAGNOSIS — Z3689 Encounter for other specified antenatal screening: Secondary | ICD-10-CM | POA: Diagnosis not present

## 2022-12-16 DIAGNOSIS — Z3A27 27 weeks gestation of pregnancy: Secondary | ICD-10-CM | POA: Diagnosis not present

## 2022-12-24 DIAGNOSIS — Z3A28 28 weeks gestation of pregnancy: Secondary | ICD-10-CM | POA: Diagnosis not present

## 2022-12-24 DIAGNOSIS — Z3689 Encounter for other specified antenatal screening: Secondary | ICD-10-CM | POA: Diagnosis not present

## 2022-12-24 DIAGNOSIS — O99810 Abnormal glucose complicating pregnancy: Secondary | ICD-10-CM | POA: Diagnosis not present

## 2023-01-13 DIAGNOSIS — O26893 Other specified pregnancy related conditions, third trimester: Secondary | ICD-10-CM | POA: Diagnosis not present

## 2023-01-13 DIAGNOSIS — Z3A31 31 weeks gestation of pregnancy: Secondary | ICD-10-CM | POA: Diagnosis not present

## 2023-01-13 DIAGNOSIS — R1032 Left lower quadrant pain: Secondary | ICD-10-CM | POA: Diagnosis not present

## 2023-01-13 DIAGNOSIS — O09813 Supervision of pregnancy resulting from assisted reproductive technology, third trimester: Secondary | ICD-10-CM | POA: Diagnosis not present

## 2023-01-13 DIAGNOSIS — Z6841 Body Mass Index (BMI) 40.0 and over, adult: Secondary | ICD-10-CM | POA: Diagnosis not present

## 2023-01-28 DIAGNOSIS — O09813 Supervision of pregnancy resulting from assisted reproductive technology, third trimester: Secondary | ICD-10-CM | POA: Diagnosis not present

## 2023-01-28 DIAGNOSIS — Z23 Encounter for immunization: Secondary | ICD-10-CM | POA: Diagnosis not present

## 2023-01-28 DIAGNOSIS — Z3A33 33 weeks gestation of pregnancy: Secondary | ICD-10-CM | POA: Diagnosis not present

## 2023-02-13 DIAGNOSIS — O09813 Supervision of pregnancy resulting from assisted reproductive technology, third trimester: Secondary | ICD-10-CM | POA: Diagnosis not present

## 2023-02-13 DIAGNOSIS — O09613 Supervision of young primigravida, third trimester: Secondary | ICD-10-CM | POA: Diagnosis not present

## 2023-02-13 DIAGNOSIS — Z3685 Encounter for antenatal screening for Streptococcus B: Secondary | ICD-10-CM | POA: Diagnosis not present

## 2023-02-13 DIAGNOSIS — O99213 Obesity complicating pregnancy, third trimester: Secondary | ICD-10-CM | POA: Diagnosis not present

## 2023-02-13 DIAGNOSIS — Z3A35 35 weeks gestation of pregnancy: Secondary | ICD-10-CM | POA: Diagnosis not present

## 2023-02-13 LAB — OB RESULTS CONSOLE GBS: GBS: POSITIVE

## 2023-02-17 DIAGNOSIS — Z3A36 36 weeks gestation of pregnancy: Secondary | ICD-10-CM | POA: Diagnosis not present

## 2023-02-17 DIAGNOSIS — O99213 Obesity complicating pregnancy, third trimester: Secondary | ICD-10-CM | POA: Diagnosis not present

## 2023-02-17 DIAGNOSIS — O365930 Maternal care for other known or suspected poor fetal growth, third trimester, not applicable or unspecified: Secondary | ICD-10-CM | POA: Diagnosis not present

## 2023-02-24 DIAGNOSIS — O09813 Supervision of pregnancy resulting from assisted reproductive technology, third trimester: Secondary | ICD-10-CM | POA: Diagnosis not present

## 2023-02-24 DIAGNOSIS — O99213 Obesity complicating pregnancy, third trimester: Secondary | ICD-10-CM | POA: Diagnosis not present

## 2023-02-24 DIAGNOSIS — Z3A37 37 weeks gestation of pregnancy: Secondary | ICD-10-CM | POA: Diagnosis not present

## 2023-02-26 ENCOUNTER — Encounter (HOSPITAL_COMMUNITY): Payer: Self-pay | Admitting: *Deleted

## 2023-02-26 ENCOUNTER — Telehealth (HOSPITAL_COMMUNITY): Payer: Self-pay | Admitting: *Deleted

## 2023-02-26 NOTE — Telephone Encounter (Signed)
Preadmission screen  

## 2023-03-05 DIAGNOSIS — Z3A38 38 weeks gestation of pregnancy: Secondary | ICD-10-CM | POA: Diagnosis not present

## 2023-03-05 DIAGNOSIS — O09813 Supervision of pregnancy resulting from assisted reproductive technology, third trimester: Secondary | ICD-10-CM | POA: Diagnosis not present

## 2023-03-05 DIAGNOSIS — O09613 Supervision of young primigravida, third trimester: Secondary | ICD-10-CM | POA: Diagnosis not present

## 2023-03-10 ENCOUNTER — Other Ambulatory Visit: Payer: Self-pay | Admitting: Obstetrics and Gynecology

## 2023-03-10 DIAGNOSIS — Z3400 Encounter for supervision of normal first pregnancy, unspecified trimester: Secondary | ICD-10-CM | POA: Diagnosis not present

## 2023-03-12 ENCOUNTER — Inpatient Hospital Stay (HOSPITAL_COMMUNITY): Payer: Commercial Managed Care - PPO | Admitting: Anesthesiology

## 2023-03-12 ENCOUNTER — Encounter (HOSPITAL_COMMUNITY): Payer: Self-pay | Admitting: Obstetrics and Gynecology

## 2023-03-12 ENCOUNTER — Inpatient Hospital Stay (HOSPITAL_COMMUNITY)
Admission: AD | Admit: 2023-03-12 | Discharge: 2023-03-15 | DRG: 797 | Disposition: A | Discharge status: Home / Self Care | Payer: Commercial Managed Care - PPO | Attending: Obstetrics and Gynecology | Admitting: Obstetrics and Gynecology

## 2023-03-12 ENCOUNTER — Inpatient Hospital Stay (HOSPITAL_COMMUNITY): Payer: Commercial Managed Care - PPO

## 2023-03-12 DIAGNOSIS — Z87442 Personal history of urinary calculi: Secondary | ICD-10-CM

## 2023-03-12 DIAGNOSIS — D62 Acute posthemorrhagic anemia: Secondary | ICD-10-CM | POA: Diagnosis not present

## 2023-03-12 DIAGNOSIS — Z3A39 39 weeks gestation of pregnancy: Secondary | ICD-10-CM | POA: Diagnosis not present

## 2023-03-12 DIAGNOSIS — O99824 Streptococcus B carrier state complicating childbirth: Secondary | ICD-10-CM | POA: Diagnosis not present

## 2023-03-12 DIAGNOSIS — O9902 Anemia complicating childbirth: Secondary | ICD-10-CM | POA: Diagnosis not present

## 2023-03-12 DIAGNOSIS — O99891 Other specified diseases and conditions complicating pregnancy: Secondary | ICD-10-CM

## 2023-03-12 DIAGNOSIS — Z3490 Encounter for supervision of normal pregnancy, unspecified, unspecified trimester: Principal | ICD-10-CM | POA: Diagnosis present

## 2023-03-12 DIAGNOSIS — O99214 Obesity complicating childbirth: Principal | ICD-10-CM | POA: Diagnosis present

## 2023-03-12 DIAGNOSIS — D649 Anemia, unspecified: Secondary | ICD-10-CM | POA: Diagnosis not present

## 2023-03-12 LAB — CBC
HCT: 34.4 % — ABNORMAL LOW (ref 36.0–46.0)
Hemoglobin: 11.5 g/dL — ABNORMAL LOW (ref 12.0–15.0)
MCH: 27.1 pg (ref 26.0–34.0)
MCHC: 33.4 g/dL (ref 30.0–36.0)
MCV: 81.1 fL (ref 80.0–100.0)
Platelets: 233 10*3/uL (ref 150–400)
RBC: 4.24 MIL/uL (ref 3.87–5.11)
RDW: 15.1 % (ref 11.5–15.5)
WBC: 11.4 10*3/uL — ABNORMAL HIGH (ref 4.0–10.5)
nRBC: 0.3 % — ABNORMAL HIGH (ref 0.0–0.2)

## 2023-03-12 LAB — TYPE AND SCREEN: Antibody Screen: NEGATIVE

## 2023-03-12 LAB — RPR: RPR Ser Ql: NONREACTIVE

## 2023-03-12 MED ORDER — OXYTOCIN BOLUS FROM INFUSION
333.0000 mL | Freq: Once | INTRAVENOUS | Status: AC
  Administered 2023-03-12: 333 mL via INTRAVENOUS

## 2023-03-12 MED ORDER — TERBUTALINE SULFATE 1 MG/ML IJ SOLN: 0.2500 mg | Freq: Once | INTRAMUSCULAR | Status: DC | PRN

## 2023-03-12 MED ORDER — OXYTOCIN-SODIUM CHLORIDE 30-0.9 UT/500ML-% IV SOLN
1.0000 m[IU]/min | INTRAVENOUS | Status: DC
  Administered 2023-03-12: 12 m[IU]/min via INTRAVENOUS
  Administered 2023-03-12: 2 m[IU]/min via INTRAVENOUS
  Filled 2023-03-12: qty 500

## 2023-03-12 MED ORDER — TRANEXAMIC ACID-NACL 1000-0.7 MG/100ML-% IV SOLN
INTRAVENOUS | Status: AC
  Filled 2023-03-12: qty 100

## 2023-03-12 MED ORDER — PENICILLIN G POT IN DEXTROSE 60000 UNIT/ML IV SOLN
3.0000 10*6.[IU] | INTRAVENOUS | Status: DC
  Administered 2023-03-12 (×2): 3 10*6.[IU] via INTRAVENOUS
  Filled 2023-03-12 (×2): qty 50

## 2023-03-12 MED ORDER — PHENYLEPHRINE 80 MCG/ML (10ML) SYRINGE FOR IV PUSH (FOR BLOOD PRESSURE SUPPORT): 80.0000 ug | PREFILLED_SYRINGE | INTRAVENOUS | Status: DC | PRN

## 2023-03-12 MED ORDER — LACTATED RINGERS IV SOLN
500.0000 mL | INTRAVENOUS | Status: DC | PRN
  Administered 2023-03-12 – 2023-03-13 (×2): 500 mL via INTRAVENOUS
  Administered 2023-03-13: 1000 mL via INTRAVENOUS

## 2023-03-12 MED ORDER — EPHEDRINE 5 MG/ML INJ: 10.0000 mg | INTRAVENOUS | Status: DC | PRN

## 2023-03-12 MED ORDER — METHYLERGONOVINE MALEATE 0.2 MG/ML IJ SOLN
INTRAMUSCULAR | Status: AC
  Filled 2023-03-12: qty 1

## 2023-03-12 MED ORDER — FENTANYL-BUPIVACAINE-NACL 0.5-0.125-0.9 MG/250ML-% EP SOLN
12.0000 mL/h | EPIDURAL | Status: DC | PRN
  Filled 2023-03-12: qty 250

## 2023-03-12 MED ORDER — FENTANYL-BUPIVACAINE-NACL 0.5-0.125-0.9 MG/250ML-% EP SOLN
EPIDURAL | Status: DC | PRN
  Administered 2023-03-12: 12 mL/h via EPIDURAL

## 2023-03-12 MED ORDER — OXYTOCIN-SODIUM CHLORIDE 30-0.9 UT/500ML-% IV SOLN
2.5000 [IU]/h | INTRAVENOUS | Status: DC
  Administered 2023-03-12: 2.5 [IU]/h via INTRAVENOUS
  Filled 2023-03-12: qty 500

## 2023-03-12 MED ORDER — MISOPROSTOL 25 MCG QUARTER TABLET
25.0000 ug | ORAL_TABLET | ORAL | Status: DC | PRN
  Administered 2023-03-12 (×2): 25 ug via VAGINAL
  Filled 2023-03-12 (×2): qty 1

## 2023-03-12 MED ORDER — LIDOCAINE HCL (PF) 1 % IJ SOLN
INTRAMUSCULAR | Status: DC | PRN
  Administered 2023-03-12: 10 mL via EPIDURAL
  Administered 2023-03-12: 2 mL via EPIDURAL

## 2023-03-12 MED ORDER — SODIUM CHLORIDE 0.9 % IV SOLN
5.0000 10*6.[IU] | Freq: Once | INTRAVENOUS | Status: AC
  Administered 2023-03-12: 5 10*6.[IU] via INTRAVENOUS
  Filled 2023-03-12: qty 5

## 2023-03-12 MED ORDER — ONDANSETRON HCL 4 MG/2ML IJ SOLN
4.0000 mg | Freq: Four times a day (QID) | INTRAMUSCULAR | Status: DC | PRN
  Administered 2023-03-12: 4 mg via INTRAVENOUS
  Filled 2023-03-12: qty 2

## 2023-03-12 MED ORDER — DIPHENHYDRAMINE HCL 50 MG/ML IJ SOLN
12.5000 mg | INTRAMUSCULAR | Status: DC | PRN
  Administered 2023-03-12: 12.5 mg via INTRAVENOUS
  Filled 2023-03-12: qty 1

## 2023-03-12 MED ORDER — SOD CITRATE-CITRIC ACID 500-334 MG/5ML PO SOLN: 30.0000 mL | ORAL | Status: DC | PRN

## 2023-03-12 MED ORDER — TRANEXAMIC ACID-NACL 1000-0.7 MG/100ML-% IV SOLN
1000.0000 mg | Freq: Once | INTRAVENOUS | Status: AC
  Administered 2023-03-12: 1000 mg via INTRAVENOUS

## 2023-03-12 MED ORDER — LACTATED RINGERS IV SOLN: INTRAVENOUS | Status: DC

## 2023-03-12 MED ORDER — ACETAMINOPHEN 325 MG PO TABS: 650.0000 mg | ORAL_TABLET | ORAL | Status: DC | PRN

## 2023-03-12 MED ORDER — LIDOCAINE HCL (PF) 1 % IJ SOLN: 30.0000 mL | INTRAMUSCULAR | Status: DC | PRN

## 2023-03-12 MED ORDER — METHYLERGONOVINE MALEATE 0.2 MG/ML IJ SOLN
0.2000 mg | Freq: Once | INTRAMUSCULAR | Status: AC
  Administered 2023-03-12: 0.2 mg via INTRAMUSCULAR

## 2023-03-12 MED ORDER — LACTATED RINGERS IV SOLN
500.0000 mL | Freq: Once | INTRAVENOUS | Status: AC
  Administered 2023-03-12: 500 mL via INTRAVENOUS

## 2023-03-12 NOTE — Anesthesia Preprocedure Evaluation (Signed)
Anesthesia Evaluation  Patient identified by MRN, date of birth, ID band Patient awake    Reviewed: Allergy & Precautions, Patient's Chart, lab work & pertinent test results  History of Anesthesia Complications (+) PONV and history of anesthetic complications  Airway Mallampati: III  TM Distance: >3 FB Neck ROM: Full    Dental no notable dental hx.    Pulmonary neg pulmonary ROS   Pulmonary exam normal breath sounds clear to auscultation       Cardiovascular negative cardio ROS Normal cardiovascular exam Rhythm:Regular Rate:Normal     Neuro/Psych  Headaches  negative psych ROS   GI/Hepatic Neg liver ROS,GERD  Controlled,,  Endo/Other    Morbid obesityBMI 54  Renal/GU negative Renal ROS  negative genitourinary   Musculoskeletal negative musculoskeletal ROS (+)    Abdominal  (+) + obese  Peds negative pediatric ROS (+)  Hematology  (+) Blood dyscrasia, anemia Hb 11.5, plt 233   Anesthesia Other Findings   Reproductive/Obstetrics (+) Pregnancy IVF                             Anesthesia Physical Anesthesia Plan  ASA: 3  Anesthesia Plan: Epidural   Post-op Pain Management:    Induction:   PONV Risk Score and Plan: 2  Airway Management Planned: Natural Airway  Additional Equipment: None  Intra-op Plan:   Post-operative Plan:   Informed Consent: I have reviewed the patients History and Physical, chart, labs and discussed the procedure including the risks, benefits and alternatives for the proposed anesthesia with the patient or authorized representative who has indicated his/her understanding and acceptance.       Plan Discussed with:   Anesthesia Plan Comments:        Anesthesia Quick Evaluation

## 2023-03-12 NOTE — H&P (Addendum)
Jacqueline [name] is a 32 y.o. female presenting for  IOL for BMI>40 and history of IVF. OB History     Gravida  1   Para  0   Term  0   Preterm  0   AB  0   Living  0      SAB  0   IAB  0   Ectopic  0   Multiple  0   Live Births  0          Past Medical History:  Diagnosis Date   Adenopathy, Left Cervical    Amenorrhea    Back pain    Complication of anesthesia    Female infertility    GERD (gastroesophageal reflux disease)    History of Epstein-Barr virus infection    Kidney stone    Migraines    Morbid (severe) obesity due to excess calories (HCC) 02/03/2017   PCOS (polycystic ovarian syndrome)    Pericarditis 11/11/2015   PONV (postoperative nausea and vomiting)    Vitamin D deficiency 02/03/2017   Past Surgical History:  Procedure Laterality Date   egg retreival     HYSTEROSCOPY  10/2020   with septum resection   LYMPH NODE BIOPSY Left 12/11/2014   Procedure: LYMPH NODE BIOPSY;  Surgeon: Jacqueline Shanks, MD;  Location: [facility];  Service: ENT;  Laterality: Left;   WISDOM TOOTH EXTRACTION     Family History: family history includes Cancer in her maternal grandmother; Cancer (age of onset: 48) in her paternal grandmother; Diabetes in her mother and paternal grandmother; Heart disease in her paternal grandfather and paternal grandmother; High Cholesterol in her mother; Hypertension in her mother; Obesity in her father and mother; Polycystic ovary syndrome in her paternal aunt; Thyroid disease in her mother. Social History:  reports that she has never smoked. She has never used smokeless tobacco. She reports that she does not currently use alcohol. She reports that she does not use drugs.     Maternal Diabetes: No Genetic Screening: Normal Maternal Ultrasounds/Referrals: Normal Fetal Ultrasounds or other Referrals:  None Maternal Substance Abuse:  No Significant Maternal Medications:  None Significant Maternal Lab Results:  Group B  Strep positive Number of Prenatal Visits:greater than 3 verified prenatal visits Other Comments:  None  Review of Systems  Constitutional: Negative.   All other systems reviewed and are negative.  Maternal Medical History:  Reason for admission: Contractions.   Contractions: Onset was 2 days ago.   Frequency: rare.   Perceived severity is mild.   Fetal activity: Perceived fetal activity is normal.   Last perceived fetal movement was within the past hour.   Prenatal complications: no prenatal complications Prenatal Complications - Diabetes: none.   Dilation: Fingertip Effacement (%): 60, 70 Station: -3 Exam by:: A. Ananias Pilgrim, RN Blood pressure (!) 146/92, pulse 95, temperature 97.8 F (36.6 C), temperature source Oral, resp. rate 17, height 5\' 4"  (1.626 m), weight (!) 142.1 kg. Maternal Exam:  Uterine Assessment: Contraction strength is mild.  Contraction frequency is irregular.  Abdomen: Patient reports no abdominal tenderness. Fetal presentation: vertex Introitus: Normal vulva. Normal vagina.  Ferning test: not done.  Nitrazine test: not done. Amniotic fluid character: not assessed. Pelvis: adequate for delivery.   Cervix: Cervix evaluated by digital exam.     Physical Exam Constitutional:      Appearance: Normal appearance. She is obese.  HENT:     Head: Normocephalic and atraumatic.  Cardiovascular:     Rate  and Rhythm: Normal rate and regular rhythm.     Pulses: Normal pulses.     Heart sounds: Normal heart sounds.  Pulmonary:     Effort: Pulmonary effort is normal.     Breath sounds: Normal breath sounds.  Abdominal:     General: Bowel sounds are normal.     Palpations: Abdomen is soft.  Genitourinary:    General: Normal vulva.  Musculoskeletal:     Cervical back: Normal range of motion and neck supple.  Skin:    General: Skin is warm and dry.  Neurological:     General: No focal deficit present.     Mental Status: She is alert and oriented to person,  place, and time.  Psychiatric:        Mood and Affect: Mood normal.        Behavior: Behavior normal.     Prenatal labs: ABO, Rh: --/--/B POS (05/02 0048) Antibody: NEG (05/02 0048) Rubella: Immune (10/11 0000) RPR: Nonreactive (10/11 0000)  HBsAg: Negative (10/11 0000)  HIV: Non-reactive (10/11 0000)  GBS: Positive/-- (04/05 0000)   Assessment/Plan: 39 wk IUP BMI > 40 History of IVF GBS pos IOL and IV PCN  Jacqueline [name] J 03/12/2023, 6:51 AM

## 2023-03-12 NOTE — Progress Notes (Signed)
Jacqueline [name] is a 32 y.o. G1P0000 at [redacted]w[redacted]d by LMP admitted for induction of labor due to IVF and BMI.  Subjective: Comfortable  Objective: BP 100/63   Pulse 94   Temp 98.2 F (36.8 C)   Resp 17   Ht 5\' 4"  (1.626 m)   Wt (!) 142.1 kg   BMI 53.78 kg/m  No intake/output data recorded. No intake/output data recorded.  FHT:  FHR: 155 bpm, variability: moderate,  accelerations:  Present,  decelerations:  Absent UC:   irregular, every 2-4 minutes SVE:   Dilation: 7 Effacement (%): 100 Station: -2 Exam by:: m wilkins rnc IUPC placed without difficulty Labs: Lab Results  Component Value Date   WBC 11.4 (H) 03/12/2023   HGB 11.5 (L) 03/12/2023   HCT 34.4 (L) 03/12/2023   MCV 81.1 03/12/2023   PLT 233 03/12/2023    Assessment / Plan: Induction of labor due to ivf and bmi,  progressing well on pitocin  Labor: Progressing normally Preeclampsia:  no signs or symptoms of toxicity Fetal Wellbeing:  Category I Pain Control:  Epidural I/D:  n/a Anticipated MOD:   working towards VD  Lenoard Aden, MD 03/12/2023, 1:41 PM

## 2023-03-12 NOTE — Anesthesia Procedure Notes (Signed)
Epidural Patient location during procedure: OB Start time: 03/12/2023 10:34 AM End time: 03/12/2023 10:46 AM  Staffing Anesthesiologist: Lannie Fields, DO Performed: anesthesiologist   Preanesthetic Checklist Completed: patient identified, IV checked, risks and benefits discussed, monitors and equipment checked, pre-op evaluation and timeout performed  Epidural Patient position: sitting Prep: DuraPrep and site prepped and draped Patient monitoring: continuous pulse ox, blood pressure, heart rate and cardiac monitor Approach: midline Location: L3-L4 Injection technique: LOR air  Needle:  Needle type: Tuohy  Needle gauge: 17 G Needle length: 9 cm Needle insertion depth: 10 cm Catheter type: closed end flexible Catheter size: 19 Gauge Catheter at skin depth: 15 cm Test dose: negative  Assessment Sensory level: T8 Events: blood not aspirated, no cerebrospinal fluid, injection not painful, no injection resistance, no paresthesia and negative IV test  Additional Notes Patient identified. Risks/Benefits/Options discussed with patient including but not limited to bleeding, infection, nerve damage, paralysis, failed block, incomplete pain control, headache, blood pressure changes, nausea, vomiting, reactions to medication both or allergic, itching and postpartum back pain. Confirmed with bedside nurse the patient's most recent platelet count. Confirmed with patient that they are not currently taking any anticoagulation, have any bleeding history or any family history of bleeding disorders. Patient expressed understanding and wished to proceed. All questions were answered. Sterile technique was used throughout the entire procedure. Please see nursing notes for vital signs. Test dose was given through epidural catheter and negative prior to continuing to dose epidural or start infusion. Warning signs of high block given to the patient including shortness of breath, tingling/numbness in  hands, complete motor block, or any concerning symptoms with instructions to call for help. Patient was given instructions on fall risk and not to get out of bed. All questions and concerns addressed with instructions to call with any issues or inadequate analgesia.    Loss at 9+++Reason for block:procedure for pain

## 2023-03-13 ENCOUNTER — Encounter (HOSPITAL_COMMUNITY): Payer: Self-pay | Admitting: Obstetrics and Gynecology

## 2023-03-13 ENCOUNTER — Other Ambulatory Visit: Payer: Self-pay

## 2023-03-13 LAB — CBC
HCT: 25.7 % — ABNORMAL LOW (ref 36.0–46.0)
HCT: 26.3 % — ABNORMAL LOW (ref 36.0–46.0)
HCT: 30.6 % — ABNORMAL LOW (ref 36.0–46.0)
Hemoglobin: 8.3 g/dL — ABNORMAL LOW (ref 12.0–15.0)
Hemoglobin: 9 g/dL — ABNORMAL LOW (ref 12.0–15.0)
Hemoglobin: 9.8 g/dL — ABNORMAL LOW (ref 12.0–15.0)
MCH: 27 pg (ref 26.0–34.0)
MCH: 27.4 pg (ref 26.0–34.0)
MCH: 28 pg (ref 26.0–34.0)
MCHC: 32 g/dL (ref 30.0–36.0)
MCHC: 32.3 g/dL (ref 30.0–36.0)
MCHC: 34.2 g/dL (ref 30.0–36.0)
MCV: 81.9 fL (ref 80.0–100.0)
MCV: 84.3 fL (ref 80.0–100.0)
MCV: 84.8 fL (ref 80.0–100.0)
Platelets: 184 10*3/uL (ref 150–400)
Platelets: 240 10*3/uL (ref 150–400)
Platelets: 264 10*3/uL (ref 150–400)
RBC: 3.03 MIL/uL — ABNORMAL LOW (ref 3.87–5.11)
RBC: 3.21 MIL/uL — ABNORMAL LOW (ref 3.87–5.11)
RBC: 3.63 MIL/uL — ABNORMAL LOW (ref 3.87–5.11)
RDW: 15.1 % (ref 11.5–15.5)
RDW: 15.1 % (ref 11.5–15.5)
RDW: 15.4 % (ref 11.5–15.5)
WBC: 15.8 10*3/uL — ABNORMAL HIGH (ref 4.0–10.5)
WBC: 18.6 10*3/uL — ABNORMAL HIGH (ref 4.0–10.5)
WBC: 19.5 10*3/uL — ABNORMAL HIGH (ref 4.0–10.5)
nRBC: 0 % (ref 0.0–0.2)
nRBC: 0 % (ref 0.0–0.2)
nRBC: 0 % (ref 0.0–0.2)

## 2023-03-13 LAB — DIC (DISSEMINATED INTRAVASCULAR COAGULATION)PANEL
D-Dimer, Quant: 2.88 ug/mL-FEU — ABNORMAL HIGH (ref 0.00–0.50)
Fibrinogen: 543 mg/dL — ABNORMAL HIGH (ref 210–475)
INR: 1.2 (ref 0.8–1.2)
Platelets: 199 10*3/uL (ref 150–400)
Prothrombin Time: 15.4 seconds — ABNORMAL HIGH (ref 11.4–15.2)
Smear Review: NONE SEEN
aPTT: 27 seconds (ref 24–36)

## 2023-03-13 MED ORDER — IBUPROFEN 600 MG PO TABS
600.0000 mg | ORAL_TABLET | Freq: Four times a day (QID) | ORAL | Status: DC
  Administered 2023-03-13 – 2023-03-15 (×9): 600 mg via ORAL
  Filled 2023-03-13 (×10): qty 1

## 2023-03-13 MED ORDER — TETANUS-DIPHTH-ACELL PERTUSSIS 5-2.5-18.5 LF-MCG/0.5 IM SUSY: 0.5000 mL | PREFILLED_SYRINGE | Freq: Once | INTRAMUSCULAR | Status: DC

## 2023-03-13 MED ORDER — DIPHENHYDRAMINE HCL 25 MG PO CAPS: 25.0000 mg | ORAL_CAPSULE | Freq: Four times a day (QID) | ORAL | Status: DC | PRN

## 2023-03-13 MED ORDER — MISOPROSTOL 200 MCG PO TABS
ORAL_TABLET | ORAL | Status: AC
  Administered 2023-03-13: 1000 ug via RECTAL
  Filled 2023-03-13: qty 5

## 2023-03-13 MED ORDER — ONDANSETRON HCL 4 MG/2ML IJ SOLN: 4.0000 mg | INTRAMUSCULAR | Status: DC | PRN

## 2023-03-13 MED ORDER — BENZOCAINE-MENTHOL 20-0.5 % EX AERO
1.0000 | INHALATION_SPRAY | CUTANEOUS | Status: DC | PRN
  Administered 2023-03-13: 1 via TOPICAL
  Filled 2023-03-13: qty 56

## 2023-03-13 MED ORDER — CEFAZOLIN SODIUM-DEXTROSE 1-4 GM/50ML-% IV SOLN
1.0000 g | Freq: Three times a day (TID) | INTRAVENOUS | Status: AC
  Administered 2023-03-13 (×3): 1 g via INTRAVENOUS
  Filled 2023-03-13 (×3): qty 50

## 2023-03-13 MED ORDER — SENNOSIDES-DOCUSATE SODIUM 8.6-50 MG PO TABS
2.0000 | ORAL_TABLET | Freq: Every day | ORAL | Status: DC
  Administered 2023-03-13 – 2023-03-15 (×3): 2 via ORAL
  Filled 2023-03-13 (×3): qty 2

## 2023-03-13 MED ORDER — COCONUT OIL OIL
1.0000 | TOPICAL_OIL | Status: DC | PRN
  Administered 2023-03-14: 1 via TOPICAL
  Administered 2023-03-15: 3 via TOPICAL

## 2023-03-13 MED ORDER — DIBUCAINE (PERIANAL) 1 % EX OINT: 1.0000 | TOPICAL_OINTMENT | CUTANEOUS | Status: DC | PRN

## 2023-03-13 MED ORDER — FENTANYL CITRATE (PF) 100 MCG/2ML IJ SOLN
INTRAMUSCULAR | Status: AC
  Administered 2023-03-13: 100 ug via INTRAVENOUS
  Filled 2023-03-13: qty 2

## 2023-03-13 MED ORDER — METHYLERGONOVINE MALEATE 0.2 MG PO TABS: 0.2000 mg | ORAL_TABLET | ORAL | Status: DC | PRN

## 2023-03-13 MED ORDER — OXYCODONE-ACETAMINOPHEN 5-325 MG PO TABS
2.0000 | ORAL_TABLET | ORAL | Status: DC | PRN
  Administered 2023-03-13: 2 via ORAL
  Filled 2023-03-13: qty 2

## 2023-03-13 MED ORDER — SODIUM CHLORIDE 0.9 % IV SOLN
500.0000 mg | Freq: Once | INTRAVENOUS | Status: AC
  Administered 2023-03-13: 500 mg via INTRAVENOUS
  Filled 2023-03-13: qty 25

## 2023-03-13 MED ORDER — AMMONIA AROMATIC IN INHA
RESPIRATORY_TRACT | Status: AC
  Filled 2023-03-13: qty 10

## 2023-03-13 MED ORDER — ACETAMINOPHEN 325 MG PO TABS
650.0000 mg | ORAL_TABLET | ORAL | Status: DC | PRN
  Administered 2023-03-13 – 2023-03-15 (×5): 650 mg via ORAL
  Filled 2023-03-13 (×5): qty 2

## 2023-03-13 MED ORDER — WITCH HAZEL-GLYCERIN EX PADS: 1.0000 | MEDICATED_PAD | CUTANEOUS | Status: DC | PRN

## 2023-03-13 MED ORDER — OXYCODONE-ACETAMINOPHEN 5-325 MG PO TABS: 1.0000 | ORAL_TABLET | ORAL | Status: DC | PRN

## 2023-03-13 MED ORDER — METHYLERGONOVINE MALEATE 0.2 MG/ML IJ SOLN: 0.2000 mg | INTRAMUSCULAR | Status: DC | PRN

## 2023-03-13 MED ORDER — MISOPROSTOL 200 MCG PO TABS: 1000.0000 ug | ORAL_TABLET | ORAL | Status: AC

## 2023-03-13 MED ORDER — METHYLERGONOVINE MALEATE 0.2 MG PO TABS
0.2000 mg | ORAL_TABLET | Freq: Four times a day (QID) | ORAL | Status: AC
  Administered 2023-03-13 (×4): 0.2 mg via ORAL
  Filled 2023-03-13 (×4): qty 1

## 2023-03-13 MED ORDER — IRON SUCROSE 500 MG IVPB - SIMPLE MED
500.0000 mg | Freq: Once | INTRAVENOUS | Status: DC
  Filled 2023-03-13 (×2): qty 275

## 2023-03-13 MED ORDER — ONDANSETRON HCL 4 MG PO TABS: 4.0000 mg | ORAL_TABLET | ORAL | Status: DC | PRN

## 2023-03-13 MED ORDER — ZOLPIDEM TARTRATE 5 MG PO TABS: 5.0000 mg | ORAL_TABLET | Freq: Every evening | ORAL | Status: DC | PRN

## 2023-03-13 MED ORDER — PRENATAL MULTIVITAMIN CH
1.0000 | ORAL_TABLET | Freq: Every day | ORAL | Status: DC
  Administered 2023-03-14 – 2023-03-15 (×2): 1 via ORAL
  Filled 2023-03-13 (×2): qty 1

## 2023-03-13 MED ORDER — SIMETHICONE 80 MG PO CHEW: 80.0000 mg | CHEWABLE_TABLET | ORAL | Status: DC | PRN

## 2023-03-13 NOTE — Progress Notes (Signed)
No c/o; tol po, no lightheadedness/dizziness; voids w/o difficulty Minimal lochia  Patient Vitals for the past 24 hrs:  BP Temp Temp src Pulse Resp SpO2  03/13/23 0856 104/66 98 F (36.7 C) -- (!) 112 18 100 %  03/13/23 0800 110/77 -- -- (!) 106 -- 99 %  03/13/23 0747 119/62 -- -- 90 -- --  03/13/23 0735 -- 98 F (36.7 C) Oral -- -- --  03/13/23 0732 99/74 -- -- (!) 105 -- --  03/13/23 0717 115/67 -- -- 89 -- --  03/13/23 0702 (!) 115/58 -- -- 90 15 --  03/13/23 0647 114/65 -- -- 100 -- --  03/13/23 0632 118/83 -- -- (!) 110 -- --  03/13/23 0617 129/89 -- -- (!) 128 -- --  03/13/23 0602 (!) 110/50 98.4 F (36.9 C) Oral (!) 126 -- --  03/13/23 0547 121/69 -- -- (!) 134 -- --  03/13/23 0541 -- -- -- -- -- 98 %  03/13/23 0536 -- -- -- -- -- 98 %  03/13/23 0531 120/79 -- -- (!) 104 -- 97 %  03/13/23 0526 -- -- -- -- -- 97 %  03/13/23 0521 -- -- -- -- -- 96 %  03/13/23 0518 107/71 -- -- (!) 124 -- --  03/13/23 0516 -- -- -- -- -- 98 %  03/13/23 0515 -- -- -- -- -- 98 %  03/13/23 0511 -- -- -- -- -- 96 %  03/13/23 0506 -- -- -- -- -- 97 %  03/13/23 0501 107/67 -- -- (!) 132 -- 98 %  03/13/23 0459 106/70 -- -- (!) 135 -- --  03/13/23 0456 -- -- -- -- -- 98 %  03/13/23 0451 -- -- -- -- -- 99 %  03/13/23 0447 (!) 169/140 -- -- (!) 157 -- --  03/13/23 0446 -- -- -- -- -- 99 %  03/13/23 0445 -- -- -- -- -- 99 %  03/13/23 0441 -- -- -- -- -- 98 %  03/13/23 0436 -- -- -- -- -- 95 %  03/13/23 0431 (!) 167/92 -- -- (!) 101 -- (!) 82 %  03/13/23 0427 (!) 145/130 -- -- (!) 236 -- --  03/13/23 0423 (!) 140/96 -- -- (!) 122 -- --  03/13/23 0421 -- -- -- -- -- 98 %  03/13/23 0416 -- -- -- -- -- 97 %  03/13/23 0332 (!) 140/92 -- -- (!) 125 -- --  03/13/23 0330 (!) 140/92 -- -- (!) 125 -- --  03/13/23 0302 127/83 98.4 F (36.9 C) Oral (!) 107 16 --  03/13/23 0232 135/84 -- -- (!) 124 15 --  03/13/23 0227 117/75 -- -- (!) 124 16 --  03/13/23 0222 125/82 -- -- (!) 110 -- --  03/13/23 0217  135/82 -- -- (!) 115 -- --  03/13/23 0212 137/86 -- -- (!) 111 -- --  03/13/23 0207 131/76 -- -- (!) 120 -- --  03/13/23 0204 120/78 -- -- (!) 126 -- --  03/13/23 0203 120/78 -- -- (!) 126 -- --  03/13/23 0157 128/89 -- -- (!) 133 -- --  03/13/23 0152 (!) 121/93 -- -- (!) 130 -- --  03/13/23 0147 121/85 -- -- (!) 127 -- --  03/13/23 0141 121/78 -- -- (!) 112 -- --  03/13/23 0137 114/70 -- -- (!) 125 -- --  03/13/23 0132 113/70 -- -- (!) 115 -- --  03/13/23 0128 127/70 -- -- (!) 126 -- --  03/13/23 0125 120/76 -- -- (!) 125 16 --  03/13/23 0102 121/81 98.4 F (36.9 C) Oral (!) 128 15 --  03/13/23 0101 -- -- -- -- -- 99 %  03/13/23 0056 -- -- -- -- -- 99 %  03/13/23 0051 -- -- -- -- -- 98 %  03/13/23 0046 (!) 134/90 -- -- (!) 118 -- 97 %  03/13/23 0041 -- -- -- -- -- 97 %  03/13/23 0036 -- -- -- -- -- 97 %  03/13/23 0035 -- -- -- -- -- 97 %  03/13/23 0031 115/80 -- -- (!) 120 -- --  03/13/23 0016 114/73 -- -- (!) 120 -- --  03/13/23 0011 111/82 -- -- (!) 126 -- --  03/13/23 0006 116/72 -- -- (!) 127 -- --  03/13/23 0001 118/84 -- -- (!) 127 -- --  03/12/23 2356 112/84 -- -- (!) 128 -- --  03/12/23 2352 117/76 -- -- (!) 131 -- --  03/12/23 2347 116/77 -- -- (!) 126 -- --  03/12/23 2342 124/74 -- -- (!) 131 -- --  03/12/23 2337 (!) 140/78 -- -- (!) 122 -- --  03/12/23 2332 126/79 -- -- (!) 128 -- --  03/12/23 2327 122/75 -- -- (!) 129 -- --  03/12/23 2322 114/67 -- -- (!) 128 -- --  03/12/23 2317 118/69 -- -- (!) 129 -- --  03/12/23 2316 118/69 -- -- (!) 129 -- --  03/12/23 2314 122/78 -- -- (!) 134 -- --  03/12/23 2310 113/75 -- -- (!) 130 -- --  03/12/23 2302 122/71 -- -- (!) 133 -- --  03/12/23 2253 127/72 -- -- (!) 141 -- --  03/12/23 2236 (!) 147/99 -- -- (!) 136 -- --  03/12/23 2032 (!) 148/100 -- -- 90 -- --  03/12/23 2002 (!) 145/68 98.3 F (36.8 C) Oral (!) 103 16 --  03/12/23 1932 133/83 -- -- 97 -- --  03/12/23 1919 -- 98.2 F (36.8 C) Oral -- -- --  03/12/23  1902 129/76 -- -- 87 -- --  03/12/23 1858 -- 98.1 F (36.7 C) -- -- -- --  03/12/23 1832 (!) 107/54 -- -- 75 -- --  03/12/23 1802 (!) 115/58 -- -- 84 -- --  03/12/23 1732 107/60 -- -- 81 -- --  03/12/23 1700 123/65 -- -- 86 -- --  03/12/23 1632 123/70 -- -- (!) 104 -- --  03/12/23 1602 122/66 -- -- 91 -- --  03/12/23 1532 114/71 -- -- 95 -- --  03/12/23 1502 111/68 -- -- 97 -- --  03/12/23 1432 112/67 -- -- 87 -- --  03/12/23 1402 114/63 -- -- 98 -- --  03/12/23 1332 115/68 -- -- 90 -- --  03/12/23 1301 110/67 -- -- 76 -- --  03/12/23 1232 91/63 -- -- 91 -- --  03/12/23 1218 100/63 98.2 F (36.8 C) -- 94 -- --  03/12/23 1132 105/62 -- -- 94 -- --  03/12/23 1111 124/85 -- -- (!) 107 -- --  03/12/23 1107 113/76 -- -- (!) 102 -- --  03/12/23 1102 123/75 -- -- 100 -- --  03/12/23 1056 121/80 -- -- (!) 114 -- --  03/12/23 1051 122/86 -- -- (!) 118 -- --  03/12/23 1049 116/74 -- -- 96 -- --   A&ox3 Rrr Ctab Abd: soft,nt,nd; fundus firm and below umb LE: tr edema, nt bilat     Latest Ref Rng & Units 03/13/2023    5:52 AM 03/13/2023    3:02 AM 03/13/2023    3:01 AM  CBC  WBC  4.0 - 10.5 K/uL 18.6  15.8    Hemoglobin 12.0 - 15.0 g/dL 8.3  9.0    Hematocrit 36.0 - 46.0 % 25.7  26.3    Platelets 150 - 400 K/uL 264  184  199    A/P: ppd1 s/p svd, PPH >1042ml Pp - doing well, contin care; complete methergine x24 hr; abx for manual extraction and curettage Anemia d/t acute blood loss - stable, asymptomatic; will plan iv iron now, pt understands may need blood if symptomatic and will follow closely; caution with ambulation Morbid obesity Rh pos Rubella immune Retaned placenta, s/p sharp curettage at bedside

## 2023-03-13 NOTE — Lactation Note (Addendum)
This note was copied from a baby's chart. Lactation Consultation Note  Patient Name: Jacqueline [name] [id number] Date: 03/13/2023 Age:34 hours/ PPH, PCOS, infertility, IVF  Reason for consult: Initial assessment;Difficult latch;Primapara;1st time breastfeeding;Maternal endocrine disorder;Term During this consult baby stooled x2 and wet x 1 . LC assisted dad to change diapers.  LC noted baby has not been to the breast according to the doc flow sheets and per mom after her PPH , a nurse assisted to latch the baby.  LC offered to assist and mom receptive. Started on the right breast and due to areola edema baby unable to sustain a latch. Switched to the left breast, and after several attempts and assistance baby sustained a 5 mins latch with swallows and nipple well rounded when baby released.  Baby STS with mom and fell asleep.  LC discussed with parents baby is progressing to latch better, but if over night baby doesn't latch, supplementing would be indicated.  LC offered donor milk and per mom preferred the organic formula they broad from home. Parents are aware the latching may take time and being persistent due to the recessed chin and the high palate.  LC plan:  Due to position of the nipple - breast shells are noted appropriate.  Prior to latching on the 1st breast            Breast massage, hand express, pre-pump with the hand pump 10 -20 strokes and reverse pressure as shown.  Offer the breast with feeding cues and by 3 hours STS . ( 8-12 times in 24 hours)  After feeding the 1st breast offer the 2nd breast If unable to latch - supplement with the formula ( preferred by mom  Post pump both breast for 15 mins and save milk for the next feeding #21 F.    Maternal Data - mom reports several breast changes with pregnancy, change in size , areola larger and darkener. Sore in the 1st trimester.  Has patient been taught Hand Expression?: Yes Does the patient have breastfeeding experience prior to  this delivery?: No  Feeding Mother's Current Feeding Choice: Breast Milk  LATCH Score Latch: Repeated attempts needed to sustain latch, nipple held in mouth throughout feeding, stimulation needed to elicit sucking reflex.  Audible Swallowing: A few with stimulation  Type of Nipple: Everted at rest and after stimulation (areola compressible)  Comfort (Breast/Nipple): Soft / non-tender  Hold (Positioning): Assistance needed to correctly position infant at breast and maintain latch.  LATCH Score: 7   Lactation Tools Discussed/Used Tools: Pump;Flanges Flange Size: 21;24;Other (comment) (#21 F was the better fit) Breast pump type: Manual;Double-Electric Breast Pump Pump Education: Milk Storage;Setup, frequency, and cleaning Reason for Pumping: due to PPH , PCOS , infertity , DL  Interventions Interventions: Breast feeding basics reviewed;Assisted with latch;Skin to skin;Breast massage;Hand express;Pre-pump if needed;Reverse pressure;Breast compression;Adjust position;Support pillows;Position options;Hand pump;DEBP;Education;LC Services brochure  Discharge Pump: Personal;Manual  Consult Status Consult Status: Follow-up Date: 03/14/23 Follow-up type: In-patient    Matilde Sprang Jazlyne Gauger 03/13/2023, 1:17 PM

## 2023-03-13 NOTE — Progress Notes (Signed)
Dr. Amado Nash updated on patient's status and bleeding.

## 2023-03-13 NOTE — Progress Notes (Signed)
After completing routine postpartum care and preparation for transfer to mother/baby unit, patient had an episode of syncope while transferring to the wheelchair. Patient returned back to baseline by verbal commands and use of ammonia. Patient was transferred back into the bed and Dr. Amado Nash was called and updated on patient's situation.

## 2023-03-13 NOTE — Progress Notes (Signed)
Lochia good no clots noted upon admission to room. Patient told to call if she feels big gushes or blood clots or is not feeling well. Patient's color is better. Patient told to about room and call bell and change of color with infant and bulb suction. Patient and FOB are tired and want to rest. I told her to call with any concerns.

## 2023-03-13 NOTE — Anesthesia Postprocedure Evaluation (Signed)
Anesthesia Post Note  Patient: Jacqueline [name]  Procedure(s) Performed: AN AD HOC LABOR EPIDURAL     Patient location during evaluation: Mother Baby Anesthesia Type: Epidural Level of consciousness: awake and alert Pain management: pain level controlled Vital Signs Assessment: post-procedure vital signs reviewed and stable Respiratory status: spontaneous breathing, nonlabored ventilation and respiratory function stable Cardiovascular status: stable Postop Assessment: no headache, no backache, epidural receding, no apparent nausea or vomiting, patient able to bend at knees, able to ambulate and adequate PO intake Anesthetic complications: no   No notable events documented.  Last Vitals:  Vitals:   03/13/23 0800 03/13/23 0856  BP: 110/77 104/66  Pulse: (!) 106 (!) 112  Resp:  18  Temp:  36.7 C  SpO2: 99% 100%    Last Pain:  Vitals:   03/13/23 0735  TempSrc: Oral  PainSc: 0-No pain   Pain Goal:                   [place]

## 2023-03-13 NOTE — Progress Notes (Addendum)
Called by nursing about patient having episode of bleeding and concern for retained placenta.  She noted "sticky" tissue, which she thought was c/w placental pieces. She reported the placenta removed in pieces with manual extraction, with last fundal rub it took a while to resolve bleeding, small clots noted.  The patient is sitting in bed, doesn't feel lightheaded/dizzy  Temp:  [97.8 F (36.6 C)-98.4 F (36.9 C)] 98.4 F (36.9 C) (05/03 0302) Pulse Rate:  [75-141] 125 (05/03 0332) Resp:  [15-17] 16 (05/03 0302) BP: (91-148)/(54-100) 140/92 (05/03 0332) SpO2:  [97 %-99 %] 99 % (05/03 0101)   Bedside u/s: thickened stripe, appears possible clot/placenta fundal and anterior  BME performed and large number clots removed from the uterus and placenta membranes and tissue noted; unable to remove all tissue; at this time I counseled the patient on retained placenta and need for removal.  Options for D&C in OR vs bedside curretage; reviewed process of bedside curettage with assistance from anesthesia with nitrous to help release placenta, then sharp curettage to remove placenta and this would be done with u/s guidance to ensure complete removal of placenta; pt agrees to bedside procedure; the patient was given fentanyl and then anesthesia gave nitroglycerine.  Ring forcep placed on anterior cervix, then Sharp curettage was performed with large banjo and several passes were performed, removing pieces of placenta/membranes and clot.  The patient tolerated well. After procedure, u/s showed thin endometrium c/w pp state and no longer evidence of retained placenta. 1,063mcg cytotec placed rectally, plan methergine po x24 hrs Uterine tone was good.  Total EBL 1,049 ml with PPH  Plan repeat cbc now; pt is pale but not c/o sob/cp, not lightheaded/dizzy; will follow closely; reviewed with patient possible need for blood and will follow closely, if not symptomatic, will plan IV iron with oral therapy. Begin  ancef x24 hours to reduce risk of infection

## 2023-03-14 LAB — CBC
HCT: 16.4 % — ABNORMAL LOW (ref 36.0–46.0)
HCT: 23.1 % — ABNORMAL LOW (ref 36.0–46.0)
Hemoglobin: 5.4 g/dL — CL (ref 12.0–15.0)
Hemoglobin: 7.6 g/dL — ABNORMAL LOW (ref 12.0–15.0)
MCH: 27.8 pg (ref 26.0–34.0)
MCH: 28.3 pg (ref 26.0–34.0)
MCHC: 32.9 g/dL (ref 30.0–36.0)
MCHC: 32.9 g/dL (ref 30.0–36.0)
MCV: 84.5 fL (ref 80.0–100.0)
MCV: 85.9 fL (ref 80.0–100.0)
Platelets: 171 10*3/uL (ref 150–400)
Platelets: 212 10*3/uL (ref 150–400)
RBC: 1.94 MIL/uL — ABNORMAL LOW (ref 3.87–5.11)
RBC: 2.69 MIL/uL — ABNORMAL LOW (ref 3.87–5.11)
RDW: 15.8 % — ABNORMAL HIGH (ref 11.5–15.5)
RDW: 14.9 % (ref 11.5–15.5)
WBC: 10.4 10*3/uL (ref 4.0–10.5)
WBC: 12.6 10*3/uL — ABNORMAL HIGH (ref 4.0–10.5)
nRBC: 0.5 % — ABNORMAL HIGH (ref 0.0–0.2)
nRBC: 0.9 % — ABNORMAL HIGH (ref 0.0–0.2)

## 2023-03-14 LAB — TYPE AND SCREEN: ABO/RH(D): B POS

## 2023-03-14 LAB — ABO/RH: ABO/RH(D): B POS

## 2023-03-14 LAB — BPAM RBC
ISSUE DATE / TIME: 202405041006
Unit Type and Rh: 7300
Unit Type and Rh: 7300

## 2023-03-14 LAB — PREPARE RBC (CROSSMATCH)

## 2023-03-14 MED ORDER — SODIUM CHLORIDE 0.9% IV SOLUTION: Freq: Once | INTRAVENOUS | Status: AC

## 2023-03-14 MED ORDER — DIPHENHYDRAMINE HCL 50 MG/ML IJ SOLN
25.0000 mg | Freq: Once | INTRAMUSCULAR | Status: AC
  Administered 2023-03-14: 25 mg via INTRAVENOUS
  Filled 2023-03-14: qty 1

## 2023-03-14 MED ORDER — ACETAMINOPHEN 325 MG PO TABS
650.0000 mg | ORAL_TABLET | Freq: Once | ORAL | Status: AC
  Administered 2023-03-14: 650 mg via ORAL
  Filled 2023-03-14: qty 2

## 2023-03-14 NOTE — Lactation Note (Signed)
This note was copied from a baby's chart. Lactation Consultation Note  Patient Name: Jacqueline [name] [id number] Date: 03/14/2023 Age:34 hours Reason for consult: Follow-up assessment;Mother's request;Term;RN request;1st time breastfeeding;Breastfeeding assistance;Difficult latch See Birth Parent- MR: Birth Parent with hx: PCOS, PPH and IVF LC observe infant does have a  high palate, recessive chin with a un-coordinate suck. Birth Parent latched infant on her right breast using the football hold position, infant was on and off the breast for 7 minutes. Afterwards infant was supplemented with 10 mls of donor breast milk on LC gloved finger using curve tip syringe infant would suckle but then loose suction, suck was un-coordinated. Birth Parent will continue to use DEBP every 3 hours for 15 minutes on initial setting as she work towards latching infant at the breast, continue to pump to help stimulate and establish milk supply.   Current Breastfeeding plan:  1- Birth Parent will continue to work towards latching infant at the breast and ask RN/LC for further latch assistance, BF infant by cues, on demand, 8 to 12+ times, Skin to skin. 2- Birth Parent will continue to supplement infant with any EBM first and then donor breast milk. 3- Birth Parent will continue to use DEBP to help establish and stimulate her milk supply as she continue to work towards infant sustaining her latch at the breast.  Maternal Data    Feeding Mother's Current Feeding Choice: Breast Milk and Donor Milk  LATCH Score Latch: Repeated attempts needed to sustain latch, nipple held in mouth throughout feeding, stimulation needed to elicit sucking reflex.  Audible Swallowing: A few with stimulation  Type of Nipple: Everted at rest and after stimulation  Comfort (Breast/Nipple): Soft / non-tender  Hold (Positioning): Assistance needed to correctly position infant at breast and maintain latch.  LATCH Score: 7   Lactation  Tools Discussed/Used Tools: Pump Flange Size: 21 Breast pump type: Double-Electric Breast Pump Pump Education: Setup, frequency, and cleaning;Milk Storage Reason for Pumping: Infant not sustaining latch, PCOS, IVF and PPH Pumping frequency: Birth Parent will continue to use DEBP every 3 hours for 15 minutes on inital setting.  Interventions Interventions: Adjust position;Support pillows;Position options;Skin to skin;Expressed milk;Breast compression;Education;Pace feeding  Discharge    Consult Status Consult Status: Follow-up Date: 03/14/23 Follow-up type: In-patient    Frederico Hamman 03/14/2023, 2:18 AM

## 2023-03-14 NOTE — Lactation Note (Signed)
This note was copied from a baby's chart. Lactation Consultation Note  Patient Name: Girl Akala Laffitte WUJWJ'X Date: 03/14/2023 Age:34 hours Reason for consult: Breastfeeding assistance;Mother's request;Difficult latch;Term, infant with -5.61% weight loss  See Birth Parent- MR: Birth Parent with hx: PCOS, PPH and IVF LC observe infant does have a  high palate and recessive chin, now using 20 mm NS to help  infant sustaining latch. Birth Parent latched infant on her right breast using the football hold position, NS was initially filled with 0.5 mls of donor breast milk, infant sustained latch, swallows heard, Birth Parent having cramping with this latch, infant was supplement with 13 mls of donor breast milk at the breast and infant BF for 16 minutes. Birth Parent was happy with latch this is the longest infant has BF since birth. Afterward LC assisted with making hands free bra due to Birth Parent receiving blood transfusion. LC re-fitted Birth Parent with 21 mm breast flange and ask Birth Parent to only use 21 mm breast flanges  because their are different sizes in her kit and using the wrong size may contribute to nipple soreness.   Birth Parent current feeding plan: 1- Continue to apply 20 mm NS, BF infant by cues on demand, 8 to 12+ times within 24 hours, STS. Continue to ask RN/LC for further latch assistance if needed. 2- Continue to supplement infant with donor breast milk as she is working toward infant sustaining latch and feeding for longer duration at the breast. Birth Parent knows on day two after latching infant at the breast to offer (7-12 mls) of donor  breast milk or more if infant wants it. LC gave handout "Supplementing with Breastfeeding." 3- Birth Parent will continue to use DEBP every 3 hours for 15 minutes on initial setting and will give infant back any EBM first that is pumped before supplementing with donor breast milk after latching infant at the breast.  Maternal Data     Feeding Mother's Current Feeding Choice: Breast Milk and Formula  LATCH Score Latch: Grasps breast easily, tongue down, lips flanged, rhythmical sucking. (Sustains latch with nipple shield, infant has high arched palate)  Audible Swallowing: Spontaneous and intermittent  Type of Nipple: Everted at rest and after stimulation  Comfort (Breast/Nipple): Soft / non-tender  Hold (Positioning): Assistance needed to correctly position infant at breast and maintain latch.  LATCH Score: 9   Lactation Tools Discussed/Used Flange Size: 21 Breast pump type: Double-Electric Breast Pump Pump Education: Setup, frequency, and cleaning;Milk Storage Reason for Pumping: Working on infant having better latch, PCOS, Anemia, PPH, blood transfusion Pumping frequency: Birth Parent will continue to pump every 3 hours for 15 minutes.  Interventions Interventions: Support pillows;Position options;Skin to skin;Assisted with latch;Adjust position;Breast compression;Education;DEBP  Discharge Pump: Personal;DEBP (Spectra 2 DEBP at home.)  Consult Status Consult Status: Follow-up Date: 03/15/23 Follow-up type: In-patient    Frederico Hamman 03/14/2023, 5:02 PM

## 2023-03-14 NOTE — Progress Notes (Signed)
Post Partum Day Two NSVD c/b retained placenta and PPH Subjective:  Called by RN this morning with critical lab value for Hgb 5.4 Patient seen and evaluated at bedside. Patient resting comfortably in bed working with LC on BF. She reports feeling very weak and tired. Difficulties with ambulating much further than to the bathroom but denies any significant dizziness at rest. VB has been WNL overnight and this morning. No passing of clots. Has been spontaneously voiding without difficulties. No N/V, tolerating regular diet. No CP, SOB, HA. Baby girl doing well at bedside.   Objective: Patient Vitals for the past 24 hrs:  BP Temp Temp src Pulse Resp SpO2  03/14/23 0523 121/73 97.8 F (36.6 C) Axillary 77 20 99 %  03/13/23 1943 (!) 126/57 98.1 F (36.7 C) Oral 92 18 99 %  03/13/23 1525 123/71 98 F (36.7 C) -- 83 18 --  03/13/23 1207 125/75 98.2 F (36.8 C) Oral (!) 102 18 100 %    Physical Exam:  General: alert, no distress, and pale Lochia: appropriate Uterine Fundus: firm DVT Evaluation: No evidence of DVT seen on physical exam.  Recent Labs    03/12/23 2351 03/13/23 0301 03/13/23 0302 03/13/23 0552 03/14/23 0728  WBC 19.5*  --  15.8* 18.6* 10.4  HGB 9.8*  --  9.0* 8.3* 5.4*  HCT 30.6*  --  26.3* 25.7* 16.4*  PLT 240 199 184 264 171    No results for input(s): "NA", "K", "CL", "CO2CT", "BUN", "CREATININE", "GLUCOSE", "BILITOT", "ALT", "AST", "ALKPHOS", "PROT", "ALBUMIN" in the last 72 hours.  No results for input(s): "CALCIUM", "MG", "PHOS" in the last 72 hours.  Recent Labs    03/13/23 0301  APTT 27  INR 1.2    Recent Labs    03/13/23 0301  APTT 27  INR 1.2  FIBRINOGEN 543*   Assessment/Plan:  Jacqueline [name] 31 y.o. G1P1001 PPD#2 sp NSVD c/b retained placenta and PPH 1. PPH 2/2 retained placenta s/p bedside D&C on PPD1. Patient received Methergine series, Cytotec, TXA, and Pitocin. PPD2 bleeding appropriate. Hgb drop from 11.5 starting to 5.4 this  morning. Patient consented for blood transfusion. Reviewed risks for transfusion reaction and infection rare. Answered patient questions and advised safe to continue BF without interruption. Patient agrees to blood transfusion, 2U PRBC ordered with pre-transfusion meds. Plan for repeat post-transfusion CBC later this afternoon 2. Continue routine PP care with PP pain regimen, regular diet, encourage ambulation as tolerated 3. RH POS/Rubella Imm  We discussed possible DC home later today pending blood transfusion and clinical status versus additional night inpatient. Will reassess this afternoon   LOS: 2 days   Zachari Alberta A Zachry Hopfensperger 03/14/2023, 9:32 AM

## 2023-03-14 NOTE — Lactation Note (Signed)
This note was copied from a baby's chart. Lactation Consultation Note  Patient Name: Jacqueline [name] [id number] Date: 03/14/2023 Age:34 hours Reason for consult: Follow-up assessment;Primapara;1st time breastfeeding;Term;Infant weight loss;Breastfeeding assistance;Other (Comment) (5.61% WL; IVF)  The infant was at 16 hours old.  LC entered the room and the infant was laying next to the birth parent.  The birth parent stated that the infant had a good feeding at 0600, but she has been sleeping since then.  LC assisted the birth parent with attempting to put the infant to the left breast in the football position.  The infant was sleepy and uninterested in feeding.  The birth parent said that she was given a size #24 and a size #20 NS last night by the nurse and that helped with feeding.  LC encouraged the birth parent to try the size #20.  The MD entered the room and the [facility] exited the room.   LC returned to the room at 0940.  The birth parent stated that she was told that she needed a blood transfusion.  She became tearful and said that she wanted to maintain her supply of breast milk.  LC encouraged her to continue latching the infant and pumping when she feels better.  LC spoke with the birth parent about using formula to supplement the infant if she wakes up and is hungry prior to the [facility] coming to the floor. LC spoke with the RN who stated that she just put in the order for the [facility].  LC spoke with the parents about sterilizing powdered formula with boiled sterile water, adding the scoops of formula as indicated on the can, and allowing the formula to cool prior to feeding it to baby.  The parents were given 1 bottle of Similac Total Care 360 20 cal to feed to the infant when she is ready for her next feeding if needed.  All questions were answered.    Maternal Data    Feeding Mother's Current Feeding Choice: Breast Milk and Donor Milk  LATCH Score Latch: Repeated attempts needed to  sustain latch, nipple held in mouth throughout feeding, stimulation needed to elicit sucking reflex.  Audible Swallowing: A few with stimulation  Type of Nipple: Everted at rest and after stimulation  Comfort (Breast/Nipple): Filling, red/small blisters or bruises, mild/mod discomfort  Hold (Positioning): Full assist, staff holds infant at breast  LATCH Score: 5   Lactation Tools Discussed/Used    Interventions Interventions: Assisted with latch;Adjust position;Support pillows;Education  Discharge    Consult Status Consult Status: Follow-up Date: 03/15/23 Follow-up type: In-patient    Delene Loll 03/14/2023, 9:26 AM

## 2023-03-15 DIAGNOSIS — O99891 Other specified diseases and conditions complicating pregnancy: Secondary | ICD-10-CM

## 2023-03-15 LAB — TYPE AND SCREEN
Unit division: 0
Unit division: 0

## 2023-03-15 LAB — BPAM RBC
Blood Product Expiration Date: 202405122359
Blood Product Expiration Date: 202405122359
ISSUE DATE / TIME: 202405041415

## 2023-03-15 MED ORDER — IBUPROFEN 600 MG PO TABS
600.0000 mg | ORAL_TABLET | Freq: Four times a day (QID) | ORAL | 0 refills | Status: DC
  Filled 2023-03-15: qty 30, 8d supply, fill #0

## 2023-03-15 MED ORDER — ACETAMINOPHEN 325 MG PO TABS: 650.0000 mg | ORAL_TABLET | ORAL | Status: DC | PRN

## 2023-03-15 MED ORDER — PRENATAL MULTIVITAMIN CH: 1.0000 | ORAL_TABLET | Freq: Every day | ORAL | Status: DC

## 2023-03-15 MED ORDER — SENNOSIDES-DOCUSATE SODIUM 8.6-50 MG PO TABS: 2.0000 | ORAL_TABLET | Freq: Every day | ORAL | Status: DC

## 2023-03-15 NOTE — Discharge Summary (Signed)
Postpartum Discharge Summary  Date of Service updated 03/15/23     Patient Name: Jacqueline [name] DOB: 10/16/1991 MRN: [id number]  Date of admission: 03/12/2023 Delivery date:03/12/2023  Delivering provider: Olivia Mackie  Date of discharge: 03/15/2023  Admitting diagnosis: Encounter for induction of labor [Z34.90] Intrauterine pregnancy: [redacted]w[redacted]d     Secondary diagnosis:  Principal Problem:   Postpartum care following vaginal delivery 5/2 Active Problems:   Encounter for induction of labor   Retained placenta with hemorrhage, postpartum condition   Maternal blood transfusion  Additional problems:     Discharge diagnosis: Term Pregnancy Delivered and PPH                                              Post partum procedures:blood transfusion and curettage  Augmentation: Pitocin and Cytotec Complications: Hemorrhage>1072mL  Hospital course: Induction of Labor With Vaginal Delivery   32 y.o. yo G1P1001 at [redacted]w[redacted]d was admitted to the hospital 03/12/2023 for induction of labor.  Indication for induction:  IVF, BMI >40 .  Patient had an labor course complicated by none Membrane Rupture Time/Date: 8:36 AM ,03/12/2023   Delivery Method:Vaginal, Spontaneous  Episiotomy: None  Lacerations:  2nd degree  Details of delivery can be found in separate delivery note.  Patient had a postpartum course complicated by PPH secondary to retained placenta requiring bedside curettage and blood transfusion 2U received on PPD2. Patient is discharged home 03/15/23 hemodynamically stable and asymptomatic on PPD3.  Newborn Data: Birth date:03/12/2023  Birth time:10:24 PM  Gender:Female  Living status:Living  Apgars:6 ,8  Weight:3920 g   Magnesium Sulfate received: No BMZ received: No Rhophylac:N/A MMR:N/A Transfusion:Yes  Physical exam  Vitals:   03/14/23 1437 03/14/23 1745 03/14/23 1945 03/15/23 0418  BP: 128/70 127/74 (!) 120/56 98/69  Pulse: 92 98 92 99  Resp: 18 18 16 18   Temp: 98.2 F (36.8 C)  98 F (36.7 C) 98 F (36.7 C) 97.8 F (36.6 C)  TempSrc:  Oral Oral Oral  SpO2:   100% 99%  Weight:      Height:       General: alert and no distress Lochia: appropriate Uterine Fundus: firm Incision: N/A DVT Evaluation: Calf/Ankle edema is present Labs: Lab Results  Component Value Date   WBC 12.6 (H) 03/14/2023   HGB 7.6 (L) 03/14/2023   HCT 23.1 (L) 03/14/2023   MCV 85.9 03/14/2023   PLT 212 03/14/2023      Latest Ref Rng & Units 06/26/2021    9:50 AM  CMP  Glucose 65 - 99 mg/dL 89   BUN 6 - 20 mg/dL 9   Creatinine 4.09 - 8.11 mg/dL 9.14   Sodium 782 - 956 mmol/L 141   Potassium 3.5 - 5.2 mmol/L 4.5   Chloride 96 - 106 mmol/L 105   CO2 20 - 29 mmol/L 18   Calcium 8.7 - 10.2 mg/dL 9.7   Total Protein 6.0 - 8.5 g/dL 7.4   Total Bilirubin 0.0 - 1.2 mg/dL 0.4   Alkaline Phos 44 - 121 IU/L 75   AST 0 - 40 IU/L 57   ALT 0 - 32 IU/L 54    Edinburgh Score:    03/13/2023    8:58 AM  Edinburgh Postnatal Depression Scale Screening Tool  I have been able to laugh and see the funny side of things. 0  I  have looked forward with enjoyment to things. 0  I have blamed myself unnecessarily when things went wrong. 0  I have been anxious or worried for no good reason. 0  I have felt scared or panicky for no good reason. 0  Things have been getting on top of me. 0  I have been so unhappy that I have had difficulty sleeping. 0  I have felt sad or miserable. 0  I have been so unhappy that I have been crying. 0  The thought of harming myself has occurred to me. 0  Edinburgh Postnatal Depression Scale Total 0      After visit meds:  Allergies as of 03/15/2023       Reactions   Colchicine Diarrhea, Other (See Comments)   DIARRHEA AND MOUTH SORES   Indomethacin Other (See Comments)   Blood in stool   Phentermine Rash        Medication List     STOP taking these medications    B-D 3CC LUER-LOK SYR 18GX1-1/2 18G X 1-1/2" 3 ML Misc Generic drug: SYRINGE-NEEDLE (DISP) 3  ML   BD Disp Needle 23G X 1" Misc Generic drug: NEEDLE (DISP) 23 G   BD Disp Needles 30G X 1/2" Misc Generic drug: NEEDLE (DISP) 30 G   Cetrotide 0.25 MG Kit Generic drug: Cetrorelix Acetate   D3-50 1.25 MG (50000 UT) capsule Generic drug: Cholecalciferol   doxycycline 100 MG capsule Commonly known as: VIBRAMYCIN   doxycycline 100 MG tablet Commonly known as: VIBRA-TABS   estradiol 0.1 MG/24HR patch Commonly known as: Vivelle-Dot   estradiol 2 MG tablet Commonly known as: ESTRACE   Ganirelix Acetate 250 MCG/0.5ML Sosy   Gonal-f RFF Rediject 900 UNIT/1.5ML Sopn Generic drug: Follitropin Alfa   medroxyPROGESTERone 10 MG tablet Commonly known as: PROVERA   Menopur 75 units Solr Generic drug: Menotropins   metFORMIN 1000 MG tablet Commonly known as: GLUCOPHAGE   methylPREDNISolone 8 MG tablet Commonly known as: Medrol   Mounjaro 12.5 MG/0.5ML Pen Generic drug: tirzepatide   Mounjaro 15 MG/0.5ML Pen Generic drug: tirzepatide   norethindrone 5 MG tablet Commonly known as: Aygestin   Novarel 5000 units Solr Generic drug: Chorionic Gonadotropin   pentoxifylline 400 MG CR tablet Commonly known as: TRENTAL   progesterone 200 MG capsule Commonly known as: Prometrium   progesterone 50 MG/ML injection   vitamin E 180 MG (400 UNITS) capsule       TAKE these medications    acetaminophen 325 MG tablet Commonly known as: Tylenol Take 2 tablets (650 mg total) by mouth every 4 (four) hours as needed (for pain scale < 4).   ibuprofen 600 MG tablet Commonly known as: ADVIL Take 1 tablet (600 mg total) by mouth every 6 (six) hours.   prenatal multivitamin Tabs tablet Take 1 tablet by mouth daily at 12 noon.   senna-docusate 8.6-50 MG tablet Commonly known as: Senokot-S Take 2 tablets by mouth daily.               Discharge Care Instructions  (From admission, onward)           Start     Ordered   03/15/23 0000  Discharge wound care:        Comments: Sitz baths 2 times /day with warm water x 1 week. May add herbals: 1 ounce dried comfrey leaf* 1 ounce calendula flowers 1 ounce lavender flowers  Supplies can be found online at [employer] sources at [employer],  Deep Roots  1/2 ounce dried uva ursi leaves 1/2 ounce witch hazel blossoms (if you can find them) 1/2 ounce dried sage leaf 1/2 cup sea salt Directions: Bring 2 quarts of water to a boil. Turn off heat, and place 1 ounce (approximately 1 large handful) of the above mixed herbs (not the salt) into the pot. Steep, covered, for 30 minutes.  Strain the liquid well with a fine mesh strainer, and discard the herb material. Add 2 quarts of liquid to the tub, along with the 1/2 cup of salt. This medicinal liquid can also be made into compresses and peri-rinses.   03/15/23 0757             Discharge home in stable condition Infant Feeding: Breast Infant Disposition:home with mother Discharge instruction: per After Visit Summary and Postpartum booklet. Activity: Advance as tolerated. Pelvic rest for 6 weeks.  Diet: routine diet Anticipated Birth Control: Unsure Postpartum Appointment:6 weeks Additional Postpartum F/U:  n/a Future Appointments:No future appointments. Follow up Visit:  Patient to follow up with Dr. Billy Coast at [facility] as scheduled for 6 week postpartum visit or sooner as needed    03/15/2023 Addalynne Golding A Juris Gosnell, DO

## 2023-03-15 NOTE — Lactation Note (Signed)
This note was copied from a baby's chart. Lactation Consultation Note  Patient Name: Jacqueline [name] [id number] Date: 03/15/2023 Age:34 hours Reason for consult: Follow-up assessment;Infant weight loss;Primapara;1st time breastfeeding;Term;Maternal endocrine disorder (8 % weight loss) Per mom the baby recently BF and supplemented. Per mom baby has been breast feeding so well without the NS. LC reviewed the [facility] plan and recommended since the milk has not come in and mom has only pumped x 2 the last 24 hours drops.  LC recommended to latch on the 1st breast , supplement at least 30 ml or more to satisfy the baby and post pump both breast for 15 mins .  Mom showed LC her left nipple, tiny intact blood blister and pinky red.  Mom using coconut oil .  LC reviewed the steps for latching and the engorgement prevention and tx.  LC stressed the importance of consistent pumping after feedings to establish her milk supply.  LC recommended and LC O/P appt and mom receptive. LC provided a #24 NS if needed. Per mom had been using a #20 NS.    Maternal Data Has patient been taught Hand Expression?: Yes  Feeding Mother's Current Feeding Choice: Breast Milk and Donor Milk Nipple Type: Nfant Slow Flow (purple)  LATCH Score  Latch  8-9   Lactation Tools Discussed/Used Tools: Pump;Flanges Flange Size: 21;24 Breast pump type: Manual;Double-Electric Breast Pump Pump Education: Milk Storage;Setup, frequency, and cleaning Pumped volume:  (per mom drops)  Interventions Interventions: Breast feeding basics reviewed;Education;LC Services brochure  Discharge Discharge Education: Engorgement and breast care;Warning signs for feeding baby;Outpatient recommendation;Outpatient Epic message sent;Other (comment) (mom receptive and awarew she will receieve a call from L:C O/P) Pump: Personal;DEBP;Manual  Consult Status Consult Status: Complete Date: 03/15/23    Kathrin Greathouse 03/15/2023, 11:38 AM

## 2023-03-16 ENCOUNTER — Other Ambulatory Visit (HOSPITAL_COMMUNITY): Payer: Self-pay

## 2023-03-16 ENCOUNTER — Other Ambulatory Visit: Payer: Self-pay

## 2023-03-24 ENCOUNTER — Telehealth (HOSPITAL_COMMUNITY): Payer: Self-pay | Admitting: *Deleted

## 2023-03-24 ENCOUNTER — Other Ambulatory Visit (HOSPITAL_COMMUNITY): Payer: Self-pay

## 2023-03-24 NOTE — Telephone Encounter (Signed)
Left phone voicemail message.  Duffy Rhody, RN 03-24-2023 at 2:32pm

## 2023-04-23 DIAGNOSIS — Z124 Encounter for screening for malignant neoplasm of cervix: Secondary | ICD-10-CM | POA: Diagnosis not present

## 2023-04-23 DIAGNOSIS — Z1331 Encounter for screening for depression: Secondary | ICD-10-CM | POA: Diagnosis not present

## 2023-05-06 ENCOUNTER — Encounter (HOSPITAL_BASED_OUTPATIENT_CLINIC_OR_DEPARTMENT_OTHER): Payer: Self-pay | Admitting: Family Medicine

## 2023-05-06 ENCOUNTER — Ambulatory Visit (HOSPITAL_BASED_OUTPATIENT_CLINIC_OR_DEPARTMENT_OTHER): Payer: Commercial Managed Care - PPO | Admitting: Family Medicine

## 2023-05-06 ENCOUNTER — Other Ambulatory Visit (HOSPITAL_COMMUNITY): Payer: Self-pay

## 2023-05-06 DIAGNOSIS — M654 Radial styloid tenosynovitis [de Quervain]: Secondary | ICD-10-CM

## 2023-05-06 DIAGNOSIS — R7303 Prediabetes: Secondary | ICD-10-CM

## 2023-05-06 MED ORDER — DICLOFENAC SODIUM 1 % EX GEL
2.0000 g | Freq: Four times a day (QID) | CUTANEOUS | 1 refills | Status: DC
  Filled 2023-05-06: qty 100, 13d supply, fill #0

## 2023-05-06 MED ORDER — ZEPBOUND 2.5 MG/0.5ML ~~LOC~~ SOAJ
2.5000 mg | SUBCUTANEOUS | 1 refills | Status: DC
Start: 2023-05-06 — End: 2023-06-29
  Filled 2023-05-06 – 2023-06-11 (×3): qty 2, 28d supply, fill #0

## 2023-05-06 NOTE — Assessment & Plan Note (Signed)
Patient states that she was using Mounjaro in the past to assist with fertility.  She wonders about being able to resume medication to assist with blood sugar control/weight loss.  She did not have any issues in the past with [facility], reports that she had titrated medication up to 12.5 mg dose when she was taking it We discussed general considerations, we also discussed lack of data pertaining to use of tirzepatide with breast-feeding/lactation.  We discussed considerations related to risk, side effects as well as potential cost of medication.  After discussion, patient elected to proceed with medication.  Prescription sent to pharmacy on file.  We will look to restart medication at low-dose of 2.5 mg weekly.  Discussed that typically we would look to titrate medication monthly as long as tolerating and not noticing significant side effects Will plan for follow-up in about 1 to 2 months, can adjust follow-up based upon when she is able to obtain approval for medication

## 2023-05-06 NOTE — Patient Instructions (Signed)
  Medication Instructions:  Your physician recommends that you continue on your current medications as directed. Please refer to the Current Medication list given to you today. --If you need a refill on any your medications before your next appointment, please call your pharmacy first. If no refills are authorized on file call the office.-- Lab Work: Your physician has recommended that you have lab work today: No If you have labs (blood work) drawn today and your tests are completely normal, you will receive your results via MyChart message OR a phone call from our staff.  Please ensure you check your voicemail in the event that you authorized detailed messages to be left on a delegated number. If you have any lab test that is abnormal or we need to change your treatment, we will call you to review the results.  Referrals/Procedures/Imaging: No  Follow-Up: Your next appointment:   Your physician recommends that you schedule a follow-up appointment in 1-2 months with Dr. de Cuba.  You will receive a text message or e-mail with a link to a survey about your care and experience with us today! We would greatly appreciate your feedback!   Thanks for letting us be apart of your health journey!!  Primary Care and Sports Medicine   Dr. Raymond de Cuba   We encourage you to activate your patient portal called "MyChart".  Sign up information is provided on this After Visit Summary.  MyChart is used to connect with patients for Virtual Visits (Telemedicine).  Patients are able to view lab/test results, encounter notes, upcoming appointments, etc.  Non-urgent messages can be sent to your provider as well. To learn more about what you can do with MyChart, please visit --  [email or web address].    

## 2023-05-06 NOTE — Assessment & Plan Note (Signed)
Patient reports that during recent pregnancy she had some wrist swelling and suspected carpal tunnel syndrome and bilateral wrist.  She indicates that right wrist is currently without any symptoms, however left wrist has been more painful.  Pain is mostly along radial aspect of wrist, no associated numbness or tingling.  Pain will be worse with certain activities, does not have any nighttime symptoms On exam, no noticeable muscle wasting in either hand bilaterally.  No tenderness to palpation along ulnar aspect of left wrist or centrally through left wrist.  She does have some tenderness to palpation near base of left first metacarpal extending proximally.  Distal neurovascular exam is intact.  Positive Finkelstein test. Discussed that symptoms are consistent with de Quervain's tenosynovitis.  Discussed that this is typically related to inflammation of tendon sheath.  We discussed measures which can be considered including thumb spica splint, topical Voltaren gel, ultrasound-guided steroid injection.  At this time, she would prefer to proceed with use of splint, Voltaren gel.  She would prefer to avoid steroid injection if possible. Patient was provided with thumb spica splint in office today, ensure appropriate fit Can monitor progress moving forward

## 2023-05-06 NOTE — Progress Notes (Signed)
    Procedures performed today:    None.  Independent interpretation of notes and tests performed by another provider:   None.  Brief History, Exam, Impression, and Recommendations:    BP (!) 122/58 (BP Location: Left Arm, Patient Position: Sitting, Cuff Size: Large)   Pulse 76   Ht 5\' 4"  (1.626 m)   Wt 292 lb 4.8 oz (132.6 kg)   SpO2 100%   Breastfeeding Yes Comment: breast and bottle  BMI 50.17 kg/m   De Quervain's tenosynovitis Assessment & Plan: Patient reports that during recent pregnancy she had some wrist swelling and suspected carpal tunnel syndrome and bilateral wrist.  She indicates that right wrist is currently without any symptoms, however left wrist has been more painful.  Pain is mostly along radial aspect of wrist, no associated numbness or tingling.  Pain will be worse with certain activities, does not have any nighttime symptoms On exam, no noticeable muscle wasting in either hand bilaterally.  No tenderness to palpation along ulnar aspect of left wrist or centrally through left wrist.  She does have some tenderness to palpation near base of left first metacarpal extending proximally.  Distal neurovascular exam is intact.  Positive Finkelstein test. Discussed that symptoms are consistent with de Quervain's tenosynovitis.  Discussed that this is typically related to inflammation of tendon sheath.  We discussed measures which can be considered including thumb spica splint, topical Voltaren gel, ultrasound-guided steroid injection.  At this time, she would prefer to proceed with use of splint, Voltaren gel.  She would prefer to avoid steroid injection if possible. Patient was provided with thumb spica splint in office today, ensure appropriate fit Can monitor progress moving forward   Prediabetes -     Zepbound; Inject 2.5 mg into the skin once a week.  Dispense: 2 mL; Refill: 1  Severe obesity (BMI >= 40) (HCC) Assessment & Plan: Patient states that she was using  Mounjaro in the past to assist with fertility.  She wonders about being able to resume medication to assist with blood sugar control/weight loss.  She did not have any issues in the past with [facility], reports that she had titrated medication up to 12.5 mg dose when she was taking it We discussed general considerations, we also discussed lack of data pertaining to use of tirzepatide with breast-feeding/lactation.  We discussed considerations related to risk, side effects as well as potential cost of medication.  After discussion, patient elected to proceed with medication.  Prescription sent to pharmacy on file.  We will look to restart medication at low-dose of 2.5 mg weekly.  Discussed that typically we would look to titrate medication monthly as long as tolerating and not noticing significant side effects Will plan for follow-up in about 1 to 2 months, can adjust follow-up based upon when she is able to obtain approval for medication  Orders: -     Zepbound; Inject 2.5 mg into the skin once a week.  Dispense: 2 mL; Refill: 1  Other orders -     Diclofenac Sodium; Apply 2 g topically 4 (four) times daily. To affected joint.  Dispense: 100 g; Refill: 1  Return in about 6 weeks (around 06/17/2023) for med check.   ___________________________________________ Dexton Zwilling de [place], MD, ABFM, CAQSM Primary Care and Sports Medicine [facility]

## 2023-05-13 ENCOUNTER — Other Ambulatory Visit (HOSPITAL_COMMUNITY): Payer: Self-pay

## 2023-05-15 ENCOUNTER — Other Ambulatory Visit (HOSPITAL_COMMUNITY): Payer: Self-pay

## 2023-05-18 ENCOUNTER — Other Ambulatory Visit (HOSPITAL_COMMUNITY): Payer: Self-pay

## 2023-05-18 ENCOUNTER — Encounter (HOSPITAL_BASED_OUTPATIENT_CLINIC_OR_DEPARTMENT_OTHER): Payer: Self-pay | Admitting: Family Medicine

## 2023-05-19 NOTE — Telephone Encounter (Signed)
Prior auth sent in 07/09, delay in cover my meds uploading documents

## 2023-05-25 ENCOUNTER — Other Ambulatory Visit (HOSPITAL_COMMUNITY): Payer: Self-pay

## 2023-06-09 ENCOUNTER — Telehealth (HOSPITAL_BASED_OUTPATIENT_CLINIC_OR_DEPARTMENT_OTHER): Payer: Self-pay | Admitting: Family Medicine

## 2023-06-09 ENCOUNTER — Other Ambulatory Visit (HOSPITAL_BASED_OUTPATIENT_CLINIC_OR_DEPARTMENT_OTHER): Payer: Self-pay

## 2023-06-09 NOTE — Telephone Encounter (Signed)
Spoke with patient about PA, looks like pharmacy has been using the wrong insurance. Waiting on pharmacy to reprocess insurance and resend [place] the PA. Pharmacy states PA will still be denied, but we will do the PA for the patient anyway

## 2023-06-09 NOTE — Telephone Encounter (Signed)
Please call the patient back regarding medication

## 2023-06-09 NOTE — Telephone Encounter (Signed)
Pt was called in regards to Prior Authorization. Jacqueline [name] has spoke with her directly after I reached out to the pharmacy downstairs.

## 2023-06-11 ENCOUNTER — Other Ambulatory Visit (HOSPITAL_COMMUNITY): Payer: Self-pay

## 2023-06-11 NOTE — Telephone Encounter (Signed)
Pt was called in regards to prior authorization. Pt is aware that her prior authorization has been approved. She can now contact the pharmacy and have them to fill he Zepbound.

## 2023-06-11 NOTE — Telephone Encounter (Signed)
Pharmacy called regarding this prescription and is faxing over the prior authorization information they need

## 2023-06-17 ENCOUNTER — Ambulatory Visit (HOSPITAL_BASED_OUTPATIENT_CLINIC_OR_DEPARTMENT_OTHER): Payer: Commercial Managed Care - PPO | Admitting: Family Medicine

## 2023-06-25 ENCOUNTER — Encounter (HOSPITAL_BASED_OUTPATIENT_CLINIC_OR_DEPARTMENT_OTHER): Payer: Self-pay | Admitting: Family Medicine

## 2023-06-29 ENCOUNTER — Other Ambulatory Visit (HOSPITAL_COMMUNITY): Payer: Self-pay

## 2023-06-29 ENCOUNTER — Telehealth (INDEPENDENT_AMBULATORY_CARE_PROVIDER_SITE_OTHER): Payer: Commercial Managed Care - PPO | Admitting: Family Medicine

## 2023-06-29 ENCOUNTER — Telehealth (HOSPITAL_BASED_OUTPATIENT_CLINIC_OR_DEPARTMENT_OTHER): Payer: Self-pay | Admitting: *Deleted

## 2023-06-29 ENCOUNTER — Encounter (HOSPITAL_BASED_OUTPATIENT_CLINIC_OR_DEPARTMENT_OTHER): Payer: Self-pay | Admitting: Family Medicine

## 2023-06-29 MED ORDER — ZEPBOUND 5 MG/0.5ML ~~LOC~~ SOAJ
5.0000 mg | SUBCUTANEOUS | 1 refills | Status: DC
Start: 2023-06-29 — End: 2023-07-23
  Filled 2023-06-29 – 2023-07-02 (×2): qty 2, 28d supply, fill #0
  Filled 2023-07-09: qty 2, 28d supply, fill #1

## 2023-06-29 NOTE — Progress Notes (Signed)
   Virtual Visit   I connected with  Jacqueline [name]  on 06/29/23 and verified that I am speaking with the correct person using two identifiers. Visit conducted via video   I discussed the limitations, risks, security and privacy concerns of performing an evaluation and management service by telephone, including the higher likelihood of inaccurate diagnosis and treatment, and the availability of in person appointments.  We also discussed the likely need of an additional face to face encounter for complete and high quality delivery of care.  I also discussed with the patient that there may be a patient responsible charge related to this service. The patient expressed understanding and wishes to proceed.  Provider location is in medical facility. Patient location is at their home, different from provider location. People involved in care of the patient during this telehealth encounter were myself, my nurse/medical assistant, and my front office/scheduling team member.  Review of Systems: No fevers, chills, night sweats, weight loss, chest pain, or shortness of breath.   Objective Findings:    General: Speaking full sentences, no audible heavy breathing.  Sounds alert and appropriately interactive.    Independent interpretation of tests performed by another provider:   None.  Brief History, Exam, Impression, and Recommendations:    Severe obesity (BMI >= 40) (HCC) Patient has [name] utilizing Mounjaro 2.5 mg weekly dose without issue, denies any issues with GI upset, nausea, vomiting.  She has administered 3 doses so far, fourth dose will be this upcoming weekend.  She wonders about titrating dose of medication.  She has utilized medication in the past related to fertility, indicates that at that time she was able to titrate up to 12.5 mg dose.  She has [name] monitoring her weight at home, has lost about 7 pounds so far with use of medication and lifestyle modifications Given that patient has  [name] tolerating current dose well, we can titrate dose to 5 mg weekly dose.  Again cautioned about potential side effects/adverse reactions and discussed that this can be seen when starting this medication or increasing dose. We will plan for follow-up in about 1 month to assess progress with higher dose of medication and determine if looking to proceed with dose titration at that time  I discussed the above assessment and treatment plan with the patient. The patient was provided an opportunity to ask questions and all were answered. The patient agreed with the plan and demonstrated an understanding of the instructions.   The patient was advised to call back or seek an in-person evaluation if the symptoms worsen or if the condition fails to improve as anticipated.   I provided 11 minutes of face to face and non-face-to-face time during this encounter date, time was needed to gather information, review chart, records, communicate/coordinate with staff remotely, as well as complete documentation.  Return in about 4 weeks (around 07/27/2023) for med check, can be virtual.   ___________________________________________ Jacqueline Womac de [place], MD, ABFM, [facility] Primary Care and Sports Medicine [facility]

## 2023-06-29 NOTE — Assessment & Plan Note (Signed)
Patient has been utilizing Mounjaro 2.5 mg weekly dose without issue, denies any issues with GI upset, nausea, vomiting.  She has administered 3 doses so far, fourth dose will be this upcoming weekend.  She wonders about titrating dose of medication.  She has utilized medication in the past related to fertility, indicates that at that time she was able to titrate up to 12.5 mg dose.  She has been monitoring her weight at home, has lost about 7 pounds so far with use of medication and lifestyle modifications Given that patient has been tolerating current dose well, we can titrate dose to 5 mg weekly dose.  Again cautioned about potential side effects/adverse reactions and discussed that this can be seen when starting this medication or increasing dose. We will plan for follow-up in about 1 month to assess progress with higher dose of medication and determine if looking to proceed with dose titration at that time

## 2023-06-29 NOTE — Telephone Encounter (Signed)
LVM for pt to call back to go over her pre-visit questions prior to her visit.

## 2023-07-02 ENCOUNTER — Other Ambulatory Visit (HOSPITAL_COMMUNITY): Payer: Self-pay

## 2023-07-02 DIAGNOSIS — R9389 Abnormal findings on diagnostic imaging of other specified body structures: Secondary | ICD-10-CM | POA: Diagnosis not present

## 2023-07-02 DIAGNOSIS — N939 Abnormal uterine and vaginal bleeding, unspecified: Secondary | ICD-10-CM | POA: Diagnosis not present

## 2023-07-09 ENCOUNTER — Other Ambulatory Visit (HOSPITAL_COMMUNITY): Payer: Self-pay

## 2023-07-10 ENCOUNTER — Telehealth (HOSPITAL_BASED_OUTPATIENT_CLINIC_OR_DEPARTMENT_OTHER): Payer: Self-pay | Admitting: Family Medicine

## 2023-07-10 NOTE — Telephone Encounter (Signed)
Patient calling saying pharmacy states zepbound is needing a new auth. Insurance company stated they needed a verbal and to call [phone]. If done online it will not be accepted.  She needs meds by 07/11/23.

## 2023-07-10 NOTE — Telephone Encounter (Signed)
Called [phone] to submit prior auth for zepbound over the phone.  Standard request could take up to 24-72 hours.  Reference number for the prior auth: [id number]   Called and spoke with pt to let her know about the prior auth being submitted and that it could take up to 72 hours to receive a response and she verbalized understanding. Will leave encounter open.

## 2023-07-14 NOTE — Telephone Encounter (Signed)
Called phone number to check status of the PA that was submitted. As of 9/3, prior auth was not approved. Fax will be sent to the office for more information on why it was not approved.  Fax received. Reason why the prior auth was denied was due to Zepbound being classified as a glucagon-like peptide-1 weight loss medication. Patient's plan does not cover GLP-1 weight loss medications under pharmacy benefit.  Covered alternatives may include phentermine, orlistat, Contrave, or Qsymia.  An appeal can be made by provider if they don't agree with the decision made. To file an appeal, provider can call [phone].   Routing to Dr. De Peru for review.

## 2023-07-15 ENCOUNTER — Encounter (HOSPITAL_BASED_OUTPATIENT_CLINIC_OR_DEPARTMENT_OTHER): Payer: Self-pay | Admitting: Family Medicine

## 2023-07-15 DIAGNOSIS — Z3202 Encounter for pregnancy test, result negative: Secondary | ICD-10-CM | POA: Diagnosis not present

## 2023-07-20 ENCOUNTER — Other Ambulatory Visit: Payer: Self-pay | Admitting: Obstetrics and Gynecology

## 2023-07-20 ENCOUNTER — Encounter (HOSPITAL_BASED_OUTPATIENT_CLINIC_OR_DEPARTMENT_OTHER): Payer: Self-pay | Admitting: Obstetrics and Gynecology

## 2023-07-20 NOTE — Telephone Encounter (Signed)
Refer to mychart encounter. 

## 2023-07-21 ENCOUNTER — Other Ambulatory Visit (HOSPITAL_COMMUNITY): Payer: Self-pay | Admitting: Obstetrics and Gynecology

## 2023-07-21 ENCOUNTER — Encounter (HOSPITAL_BASED_OUTPATIENT_CLINIC_OR_DEPARTMENT_OTHER): Payer: Self-pay | Admitting: Obstetrics and Gynecology

## 2023-07-21 NOTE — Progress Notes (Signed)
Spoke w/ via phone for pre-op interview--- pt Lab needs dos----  cbc, t&s, urine preg             Lab results------ no COVID test -----patient states asymptomatic no test needed Arrive at ------- 1230 on 07-23-2023 NPO after MN NO Solid Food.  Clear liquids from MN until--- 1130 Med rec completed Medications to take morning of surgery ----- none Diabetic medication ----- n/a Patient instructed no nail polish to be worn day of surgery Patient instructed to bring photo id and insurance card day of surgery Patient aware to have Driver (ride ) / caregiver    for 24 hours after surgery -- husband, Jacqueline [name] Patient Special Instructions ----- n/a Pre-Op special Instructions ----- n/a Patient verbalized understanding of instructions that were given at this phone interview. Patient denies shortness of breath, chest pain, fever, cough at this phone interview.

## 2023-07-22 ENCOUNTER — Other Ambulatory Visit: Payer: Self-pay | Admitting: Obstetrics and Gynecology

## 2023-07-23 ENCOUNTER — Other Ambulatory Visit: Payer: Self-pay

## 2023-07-23 ENCOUNTER — Ambulatory Visit (HOSPITAL_COMMUNITY)
Admission: RE | Admit: 2023-07-23 | Discharge: 2023-07-23 | Disposition: A | Discharge status: Home / Self Care | Payer: Commercial Managed Care - PPO | Source: Ambulatory Visit | Attending: Obstetrics and Gynecology | Admitting: Obstetrics and Gynecology

## 2023-07-23 ENCOUNTER — Ambulatory Visit (HOSPITAL_BASED_OUTPATIENT_CLINIC_OR_DEPARTMENT_OTHER): Payer: Commercial Managed Care - PPO | Admitting: Anesthesiology

## 2023-07-23 ENCOUNTER — Encounter (HOSPITAL_BASED_OUTPATIENT_CLINIC_OR_DEPARTMENT_OTHER): Payer: Self-pay | Admitting: Obstetrics and Gynecology

## 2023-07-23 ENCOUNTER — Ambulatory Visit (HOSPITAL_BASED_OUTPATIENT_CLINIC_OR_DEPARTMENT_OTHER)
Admission: RE | Admit: 2023-07-23 | Discharge: 2023-07-23 | Disposition: A | Discharge status: Home / Self Care | Payer: Commercial Managed Care - PPO | Attending: Obstetrics and Gynecology | Admitting: Obstetrics and Gynecology

## 2023-07-23 ENCOUNTER — Encounter (HOSPITAL_BASED_OUTPATIENT_CLINIC_OR_DEPARTMENT_OTHER)
Admission: RE | Disposition: A | Discharge status: Home / Self Care | Payer: Self-pay | Source: Home / Self Care | Attending: Obstetrics and Gynecology

## 2023-07-23 DIAGNOSIS — Z8759 Personal history of other complications of pregnancy, childbirth and the puerperium: Secondary | ICD-10-CM | POA: Insufficient documentation

## 2023-07-23 DIAGNOSIS — N711 Chronic inflammatory disease of uterus: Secondary | ICD-10-CM | POA: Diagnosis not present

## 2023-07-23 DIAGNOSIS — E282 Polycystic ovarian syndrome: Secondary | ICD-10-CM | POA: Insufficient documentation

## 2023-07-23 DIAGNOSIS — Z6841 Body Mass Index (BMI) 40.0 and over, adult: Secondary | ICD-10-CM | POA: Diagnosis not present

## 2023-07-23 DIAGNOSIS — G43909 Migraine, unspecified, not intractable, without status migrainosus: Secondary | ICD-10-CM | POA: Diagnosis not present

## 2023-07-23 DIAGNOSIS — Z01818 Encounter for other preprocedural examination: Secondary | ICD-10-CM

## 2023-07-23 HISTORY — DX: Personal history of urinary calculi: Z87.442

## 2023-07-23 HISTORY — DX: Presence of spectacles and contact lenses: Z97.3

## 2023-07-23 HISTORY — PX: DILATION AND EVACUATION: SHX1459

## 2023-07-23 HISTORY — PX: OPERATIVE ULTRASOUND: SHX5996

## 2023-07-23 HISTORY — DX: Low back pain, unspecified: M54.50

## 2023-07-23 LAB — CBC
HCT: 44.1 % (ref 36.0–46.0)
Hemoglobin: 13.9 g/dL (ref 12.0–15.0)
MCH: 25.3 pg — ABNORMAL LOW (ref 26.0–34.0)
MCHC: 31.5 g/dL (ref 30.0–36.0)
MCV: 80.3 fL (ref 80.0–100.0)
Platelets: 244 10*3/uL (ref 150–400)
RBC: 5.49 MIL/uL — ABNORMAL HIGH (ref 3.87–5.11)
RDW: 16 % — ABNORMAL HIGH (ref 11.5–15.5)
WBC: 6 10*3/uL (ref 4.0–10.5)
nRBC: 0 % (ref 0.0–0.2)

## 2023-07-23 LAB — TYPE AND SCREEN
ABO/RH(D): B POS
Antibody Screen: POSITIVE

## 2023-07-23 LAB — POCT PREGNANCY, URINE: Preg Test, Ur: NEGATIVE

## 2023-07-23 SURGERY — DILATION AND EVACUATION, UTERUS
Anesthesia: General | Site: Uterus

## 2023-07-23 MED ORDER — FENTANYL CITRATE (PF) 100 MCG/2ML IJ SOLN
INTRAMUSCULAR | Status: AC
  Filled 2023-07-23: qty 2

## 2023-07-23 MED ORDER — ACETAMINOPHEN 500 MG PO TABS
ORAL_TABLET | ORAL | Status: AC
  Filled 2023-07-23: qty 1

## 2023-07-23 MED ORDER — PROMETHAZINE HCL 25 MG/ML IJ SOLN: 6.2500 mg | INTRAMUSCULAR | Status: DC | PRN

## 2023-07-23 MED ORDER — CEFAZOLIN SODIUM-DEXTROSE 2-4 GM/100ML-% IV SOLN
INTRAVENOUS | Status: AC
  Filled 2023-07-23: qty 100

## 2023-07-23 MED ORDER — OXYCODONE HCL 5 MG PO TABS: 5.0000 mg | ORAL_TABLET | Freq: Once | ORAL | Status: DC | PRN

## 2023-07-23 MED ORDER — 0.9 % SODIUM CHLORIDE (POUR BTL) OPTIME
TOPICAL | Status: DC | PRN
Start: 2023-07-23 — End: 2023-07-23
  Administered 2023-07-23: 500 mL

## 2023-07-23 MED ORDER — LACTATED RINGERS IV SOLN: INTRAVENOUS | Status: DC

## 2023-07-23 MED ORDER — BUPIVACAINE HCL (PF) 0.25 % IJ SOLN
INTRAMUSCULAR | Status: DC | PRN
  Administered 2023-07-23: 10 mL

## 2023-07-23 MED ORDER — ONDANSETRON HCL 4 MG/2ML IJ SOLN
INTRAMUSCULAR | Status: DC | PRN
  Administered 2023-07-23: 4 mg via INTRAVENOUS

## 2023-07-23 MED ORDER — PROPOFOL 10 MG/ML IV BOLUS
INTRAVENOUS | Status: DC | PRN
  Administered 2023-07-23: 20 mg via INTRAVENOUS
  Administered 2023-07-23: 200 mg via INTRAVENOUS

## 2023-07-23 MED ORDER — LIDOCAINE 2% (20 MG/ML) 5 ML SYRINGE
INTRAMUSCULAR | Status: DC | PRN
  Administered 2023-07-23: 50 mg via INTRAVENOUS

## 2023-07-23 MED ORDER — MIDAZOLAM HCL 2 MG/2ML IJ SOLN: 0.5000 mg | Freq: Once | INTRAMUSCULAR | Status: DC | PRN

## 2023-07-23 MED ORDER — SCOPOLAMINE 1 MG/3DAYS TD PT72
1.0000 | MEDICATED_PATCH | TRANSDERMAL | Status: DC
  Administered 2023-07-23: 1.5 mg via TRANSDERMAL

## 2023-07-23 MED ORDER — CEFAZOLIN SODIUM-DEXTROSE 2-4 GM/100ML-% IV SOLN
2.0000 g | INTRAVENOUS | Status: AC
  Administered 2023-07-23: 2 g via INTRAVENOUS

## 2023-07-23 MED ORDER — ACETAMINOPHEN 500 MG PO TABS
ORAL_TABLET | ORAL | Status: AC
  Filled 2023-07-23: qty 2

## 2023-07-23 MED ORDER — MIDAZOLAM HCL 2 MG/2ML IJ SOLN
INTRAMUSCULAR | Status: DC | PRN
  Administered 2023-07-23: 2 mg via INTRAVENOUS

## 2023-07-23 MED ORDER — MEPERIDINE HCL 25 MG/ML IJ SOLN: 6.2500 mg | INTRAMUSCULAR | Status: DC | PRN

## 2023-07-23 MED ORDER — PROPOFOL 10 MG/ML IV BOLUS
INTRAVENOUS | Status: AC
  Filled 2023-07-23: qty 20

## 2023-07-23 MED ORDER — LIDOCAINE HCL (PF) 2 % IJ SOLN
INTRAMUSCULAR | Status: AC
  Filled 2023-07-23: qty 5

## 2023-07-23 MED ORDER — OXYCODONE HCL 5 MG/5ML PO SOLN: 5.0000 mg | Freq: Once | ORAL | Status: DC | PRN

## 2023-07-23 MED ORDER — ACETAMINOPHEN 500 MG PO TABS
1000.0000 mg | ORAL_TABLET | Freq: Once | ORAL | Status: AC
  Administered 2023-07-23: 1000 mg via ORAL

## 2023-07-23 MED ORDER — FENTANYL CITRATE (PF) 100 MCG/2ML IJ SOLN: 25.0000 ug | INTRAMUSCULAR | Status: DC | PRN

## 2023-07-23 MED ORDER — POVIDONE-IODINE 10 % EX SWAB: 2.0000 | Freq: Once | CUTANEOUS | Status: DC

## 2023-07-23 MED ORDER — MIDAZOLAM HCL 2 MG/2ML IJ SOLN
INTRAMUSCULAR | Status: AC
  Filled 2023-07-23: qty 2

## 2023-07-23 MED ORDER — FENTANYL CITRATE (PF) 250 MCG/5ML IJ SOLN
INTRAMUSCULAR | Status: DC | PRN
  Administered 2023-07-23: 25 ug via INTRAVENOUS
  Administered 2023-07-23 (×2): 50 ug via INTRAVENOUS
  Administered 2023-07-23: 25 ug via INTRAVENOUS

## 2023-07-23 MED ORDER — SCOPOLAMINE 1 MG/3DAYS TD PT72
MEDICATED_PATCH | TRANSDERMAL | Status: AC
  Filled 2023-07-23: qty 1

## 2023-07-23 MED ORDER — DEXAMETHASONE SODIUM PHOSPHATE 10 MG/ML IJ SOLN
INTRAMUSCULAR | Status: DC | PRN
  Administered 2023-07-23: 10 mg via INTRAVENOUS

## 2023-07-23 SURGICAL SUPPLY — 23 items
CATH ROBINSON RED A/P 16FR (CATHETERS) ×2 IMPLANT
DRSG TELFA 3X8 NADH STRL (GAUZE/BANDAGES/DRESSINGS) ×2 IMPLANT
GAUZE 4X4 16PLY ~~LOC~~+RFID DBL (SPONGE) ×2 IMPLANT
GLOVE BIO SURGEON STRL SZ7.5 (GLOVE) ×2 IMPLANT
GLOVE BIOGEL PI IND STRL 7.0 (GLOVE) ×2 IMPLANT
GOWN STRL REUS W/TWL LRG LVL3 (GOWN DISPOSABLE) ×4 IMPLANT
KIT BERKELEY 1ST TRI 3/8 NO TR (MISCELLANEOUS) ×2 IMPLANT
KIT BERKELEY 1ST TRIMESTER 3/8 (MISCELLANEOUS) ×4 IMPLANT
KIT TURNOVER CYSTO (KITS) ×2 IMPLANT
NS IRRIG 1000ML POUR BTL (IV SOLUTION) ×2 IMPLANT
NS IRRIG 500ML POUR BTL (IV SOLUTION) IMPLANT
PACK VAGINAL MINOR WOMEN LF (CUSTOM PROCEDURE TRAY) ×2 IMPLANT
PAD OB MATERNITY 4.3X12.25 (PERSONAL CARE ITEMS) ×2 IMPLANT
PAD PREP 24X48 CUFFED NSTRL (MISCELLANEOUS) ×2 IMPLANT
SCRUB CHG 4% DYNA-HEX 4OZ (MISCELLANEOUS) ×2 IMPLANT
SET BERKELEY SUCTION TUBING (SUCTIONS) ×2 IMPLANT
SLEEVE SCD COMPRESS KNEE MED (STOCKING) ×2 IMPLANT
SPIKE FLUID TRANSFER (MISCELLANEOUS) ×2 IMPLANT
TOWEL OR 17X24 6PK STRL BLUE (TOWEL DISPOSABLE) ×4 IMPLANT
VACURETTE 10 RIGID CVD (CANNULA) IMPLANT
VACURETTE 7MM CVD STRL WRAP (CANNULA) IMPLANT
VACURETTE 8 RIGID CVD (CANNULA) IMPLANT
VACURETTE 9 RIGID CVD (CANNULA) IMPLANT

## 2023-07-23 NOTE — Transfer of Care (Signed)
Immediate Anesthesia Transfer of Care Note  Patient: Jacqueline [name]  Procedure(s) Performed: DILATATION AND EVACUATION (Uterus) OPERATIVE ULTRASOUND (Abdomen)  Patient Location: PACU  Anesthesia Type:General  Level of Consciousness: awake, alert , and oriented  Airway & Oxygen Therapy: Patient Spontanous Breathing  Post-op Assessment: Report given to RN and Post -op Vital signs reviewed and stable  Post vital signs: Reviewed and stable  Last Vitals:  Vitals Value Taken Time  BP 109/80 07/23/23 1524  Temp    Pulse 77 07/23/23 1526  Resp 12 07/23/23 1526  SpO2 100 % 07/23/23 1526  Vitals shown include unfiled device data.  Last Pain:  Vitals:   07/23/23 1315  TempSrc: Oral  PainSc: 0-No pain      Patients Stated Pain Goal: 5 (07/23/23 1315)  Complications: No notable events documented.

## 2023-07-23 NOTE — Op Note (Signed)
Preoperative diagnosis: retained placenta Postop diagnosis: as above.  Procedure: u/s guided Suction D&E Anesthesia LMP. Surgeon: Rhoderick Moody, MD Assistant: none  IV fluids : Estimated blood loss : 5ml Urine output:  bladder full d/t u/s guided procedure Complications none Condition stable Disposition PACU  Specimen: products of conception sent to pathology.  Procedure  Indication: Decision was made to proceed with suction D&E. Risks/ complications of surgery including infection, bleeding, damage to internal organs including uterine perforation, Asherman syndrome were reviewed. She voiced understanding and gave informed written consent.  Patient was brought to the operating room with IV running. She received preop ancef 2Gram. She underwent anesthesia without complications. She was placed in the dorsolithotomy position. The patient was prepped and draped in standard fashion.   Speculum was placed and cervix was grasped with single-tooth tenaculum. The uterus was sounded to 9 cm. Cervical os was dilated to allow an 8 curved suction curette. This was introduced in the uterine cavity and suction evacuation of products of conception was done in multiple step wise fashion. Gentle sharp curettage was performed. Suction cannula was reintroduced.  There was minimal bleeding.  At this point the procedure was complete.  All counts are correct x2. Specimen products of conception. No complications. Patient was made supine dorsal anesthesia and brought to the recovery room in stable condition.  Patient will be discharged home today. Dc meds tylenol since indocin allergy.  Dc follow up in 3 weeks in office. Warning signs of infection and excessive bleeding reviewed.

## 2023-07-23 NOTE — Discharge Instr - Supplementary Instructions (Signed)
May take Tylenol after 7:20pm if needed for discomfort.

## 2023-07-23 NOTE — Anesthesia Postprocedure Evaluation (Signed)
Anesthesia Post Note  Patient: Jacqueline [name]  Procedure(s) Performed: DILATATION AND EVACUATION (Uterus) OPERATIVE ULTRASOUND (Abdomen)     Patient location during evaluation: PACU Anesthesia Type: General Level of consciousness: awake Pain management: pain level controlled Vital Signs Assessment: post-procedure vital signs reviewed and stable Respiratory status: spontaneous breathing, nonlabored ventilation and respiratory function stable Cardiovascular status: blood pressure returned to baseline and stable Postop Assessment: no apparent nausea or vomiting Anesthetic complications: no   No notable events documented.  Last Vitals:  Vitals:   07/23/23 1555 07/23/23 1618  BP:    Pulse: (!) 42 (!) 57  Resp: 14 15  Temp:    SpO2: 97% 100%    Last Pain:  Vitals:   07/23/23 1315  TempSrc: Oral  PainSc: 0-No pain                 Linton Rump

## 2023-07-23 NOTE — Anesthesia Preprocedure Evaluation (Addendum)
Anesthesia Evaluation  Patient identified by MRN, date of birth, ID band Patient awake    Reviewed: Allergy & Precautions, NPO status , Patient's Chart, lab work & pertinent test results  History of Anesthesia Complications (+) PONV  Airway Mallampati: I  TM Distance: >3 FB Neck ROM: Full    Dental  (+) Dental Advisory Given, Caps   Pulmonary neg pulmonary ROS   breath sounds clear to auscultation       Cardiovascular negative cardio ROS  Rhythm:Regular Rate:Normal  '17 ECHO: EF 55% to 60%. - Wall motion was normal; no regional wall motion abnormalities. Left ventricular diastolic function parameters were normal. Normal strain pattern.  - Aortic valve: There was no stenosis.  - Mitral valve: There was no regurgitation.  - Right ventricle: The cavity size was normal. Systolic function was normal.    Neuro/Psych  Headaches    GI/Hepatic negative GI ROS, Neg liver ROS,,,  Endo/Other  PCOS BMI 50  Renal/GU H/o stones     Musculoskeletal   Abdominal   Peds  Hematology negative hematology ROS (+)   Anesthesia Other Findings   Reproductive/Obstetrics Retained placenta                             Anesthesia Physical Anesthesia Plan  ASA: 3  Anesthesia Plan: General   Post-op Pain Management: Tylenol PO (pre-op)*   Induction: Intravenous  PONV Risk Score and Plan: 4 or greater and Ondansetron, Dexamethasone and Scopolamine patch - Pre-op  Airway Management Planned: LMA  Additional Equipment: None  Intra-op Plan:   Post-operative Plan:   Informed Consent: I have reviewed the patients History and Physical, chart, labs and discussed the procedure including the risks, benefits and alternatives for the proposed anesthesia with the patient or authorized representative who has indicated his/her understanding and acceptance.     Dental advisory given  Plan Discussed with: CRNA and  Surgeon  Anesthesia Plan Comments:         Anesthesia Quick Evaluation

## 2023-07-23 NOTE — Anesthesia Procedure Notes (Signed)
Procedure Name: LMA Insertion Date/Time: 07/23/2023 2:49 PM  Performed by: Dairl Ponder, CRNAPre-anesthesia Checklist: Patient identified, Emergency Drugs available, Suction available and Patient being monitored Patient Re-evaluated:Patient Re-evaluated prior to induction Oxygen Delivery Method: Circle System Utilized Preoxygenation: Pre-oxygenation with 100% oxygen Induction Type: IV induction Ventilation: Mask ventilation without difficulty LMA: LMA inserted LMA Size: 4.0 Number of attempts: 1 Airway Equipment and Method: Bite block Placement Confirmation: positive ETCO2 Tube secured with: Tape Dental Injury: Teeth and Oropharynx as per pre-operative assessment

## 2023-07-23 NOTE — Discharge Instructions (Addendum)

## 2023-07-23 NOTE — H&P (Addendum)
Jacqueline [name] is an 32 y.o. G1P30female here for [facility], hysteroscopy d/t retained POC. Pt is  s/p svd 03/12/23 with manual extraction of placenta and then bedside D&E for PPH and retained placenta.  She has had persistent bleeding since delivery. U/s in office showed thickened endometrium and then SIS done and showed what appeared to be retained placenta with base at fundus. She has not been sexually active since delivery, plans condoms and no hormonal bc. This was an IVF pregnancy.  Pertinent Gynecological History:see above  Menstrual History: No LMP recorded (lmp unknown). (Menstrual status: Other).    Past Medical History:  Diagnosis Date   Adenopathy, Left Cervical 2016   in setting of epstein-barr virus   Chronic low back pain    Complication of anesthesia    Female infertility    History of Epstein-Barr virus infection 2016   approx---  per pt resolved w/ no residual   History of kidney stones    History of pericarditis 04/2016   dx 2017 ED visit ;  folowed up w/ cardiology dr berry normal ETT and echo both in epic 05-07-2016   Migraines    Morbid (severe) obesity due to excess calories (HCC) 02/03/2017   PCOS (polycystic ovarian syndrome)    PONV (postoperative nausea and vomiting)    Retained placenta    SVD 03-12-2023   complicated w/ retained placenta PPH , bedside curretage and blood transfusion x2;   scheduled for D&E for retained placenta 07-23-2023   Wears contact lenses     Past Surgical History:  Procedure Laterality Date   HYSTEROSCOPY WITH D & C     04/ 2023  and 07/ 2023   LYMPH NODE BIOPSY Left 12/11/2014   Procedure: LYMPH NODE BIOPSY;  Surgeon: Flo Shanks, MD;  Location: [facility];  Service: ENT;  Laterality: Left;   OVUM / OOCYTE RETRIEVAL  02/2022   UTERINE SEPTUM RESECTION  10/10/2022   @[facility]  by dr Rochel Brome;   via hysteroscopy   WISDOM TOOTH EXTRACTION      Family History  Problem Relation Age of Onset   Diabetes Mother     Hypertension Mother    High Cholesterol Mother    Thyroid disease Mother    Obesity Mother    Obesity Father    Cancer Maternal Grandmother        Hodgkin's Lymphoma   Cancer Paternal Grandmother 63       pancreatic, cervical   Diabetes Paternal Grandmother    Heart disease Paternal Grandmother    Heart disease Paternal Grandfather    Polycystic ovary syndrome Paternal Aunt     Social History:  reports that she has never smoked. She has never used smokeless tobacco. She reports that she does not currently use alcohol. She reports that she does not use drugs.  Allergies:  Allergies  Allergen Reactions   Colchicine Diarrhea and Other (See Comments)    DIARRHEA AND MOUTH SORES   Indomethacin Other (See Comments)    Blood in stool    Phentermine Rash    Medications Prior to Admission  Medication Sig Dispense Refill Last Dose   tirzepatide (ZEPBOUND) 5 MG/0.5ML Pen Inject 5 mg into the skin once a week. (Patient taking differently: Inject 5 mg into the skin once a week.) 2 mL 1 07/04/2023   diclofenac Sodium (VOLTAREN) 1 % GEL Apply 2 g topically 4 (four) times daily. To affected joint. (Patient not taking: Reported on 07/21/2023) 100 g 1 Not  Taking   ibuprofen (ADVIL) 200 MG tablet Take 200 mg by mouth every 6 (six) hours as needed for fever, headache or mild pain.   More than a month    ROS SOB/chest pain/ HA/ vision changes/ LE pain or swelling/ etc.  Blood pressure (!) 132/92, pulse 82, temperature (!) 97.3 F (36.3 C), temperature source Oral, resp. rate 18, height 5\' 4"  (1.626 m), weight 131.2 kg, SpO2 96%, not currently breastfeeding. Physical Exam A&O x 3 HEENT : grossly wnl Lungs ctab CV rrr Abdo soft,nt,nd; obese Extr no edema, nt bilat Pelvic : deferred   Results for orders placed or performed during the hospital encounter of 07/23/23 (from the past 24 hour(s))  Pregnancy, urine POC     Status: None   Collection Time: 07/23/23  1:00 PM  Result Value Ref Range    Preg Test, Ur NEGATIVE NEGATIVE  CBC     Status: Abnormal   Collection Time: 07/23/23  1:30 PM  Result Value Ref Range   WBC 6.0 4.0 - 10.5 K/uL   RBC 5.49 (H) 3.87 - 5.11 MIL/uL   Hemoglobin 13.9 12.0 - 15.0 g/dL   HCT 16.1 09.6 - 04.5 %   MCV 80.3 80.0 - 100.0 fL   MCH 25.3 (L) 26.0 - 34.0 pg   MCHC 31.5 30.0 - 36.0 g/dL   RDW 40.9 (H) 81.1 - 91.4 %   Platelets 244 150 - 400 K/uL   nRBC 0.0 0.0 - 0.2 %    No results found.  Assessment/Plan: 32 y/o G1P1 s/p svd with retained placenta/manual extraction Retained placenta - the patient understands recommendation for D&E, hysteroscopy with u/s guidance. She understands risk of bleeding, infection, uterine perforation, risk of further surgery, risk of blood clot to leg/lung, risk of anesthesia. Pt agrees and consent signed. Plan f/u in office in 3 wk (will be out of town in 2 wk). Proceed to OR.  Vick Frees 07/23/2023, 2:16 PM

## 2023-07-24 ENCOUNTER — Other Ambulatory Visit (HOSPITAL_COMMUNITY): Payer: Self-pay

## 2023-07-24 ENCOUNTER — Encounter (HOSPITAL_BASED_OUTPATIENT_CLINIC_OR_DEPARTMENT_OTHER): Payer: Self-pay | Admitting: Obstetrics and Gynecology

## 2023-07-25 ENCOUNTER — Other Ambulatory Visit (HOSPITAL_BASED_OUTPATIENT_CLINIC_OR_DEPARTMENT_OTHER): Payer: Self-pay | Admitting: Family Medicine

## 2023-07-25 ENCOUNTER — Other Ambulatory Visit (HOSPITAL_COMMUNITY): Payer: Self-pay

## 2023-07-25 DIAGNOSIS — R7303 Prediabetes: Secondary | ICD-10-CM

## 2023-07-27 ENCOUNTER — Other Ambulatory Visit (HOSPITAL_COMMUNITY): Payer: Self-pay

## 2023-07-27 LAB — SURGICAL PATHOLOGY

## 2023-07-27 MED ORDER — ZEPBOUND 2.5 MG/0.5ML ~~LOC~~ SOAJ
2.5000 mg | SUBCUTANEOUS | 1 refills | Status: AC
  Filled 2023-07-27: qty 2, 28d supply, fill #0

## 2023-07-28 ENCOUNTER — Other Ambulatory Visit (HOSPITAL_COMMUNITY): Payer: Self-pay

## 2023-07-28 MED ORDER — AMOXICILLIN-POT CLAVULANATE 875-125 MG PO TABS
1.0000 | ORAL_TABLET | Freq: Two times a day (BID) | ORAL | 0 refills | Status: AC
  Filled 2023-07-28: qty 14, 7d supply, fill #0

## 2023-08-06 ENCOUNTER — Other Ambulatory Visit (HOSPITAL_COMMUNITY): Payer: Self-pay

## 2023-08-06 DIAGNOSIS — Z319 Encounter for procreative management, unspecified: Secondary | ICD-10-CM | POA: Diagnosis not present

## 2023-08-06 MED ORDER — MOUNJARO 2.5 MG/0.5ML ~~LOC~~ SOAJ
2.5000 mg | SUBCUTANEOUS | 1 refills | Status: AC
Start: 2023-08-06 — End: ?
  Filled 2023-08-06: qty 2, 28d supply, fill #0
  Filled 2023-08-19: qty 4, 56d supply, fill #0
  Filled 2023-08-22: qty 2, 28d supply, fill #0

## 2023-08-07 ENCOUNTER — Other Ambulatory Visit (HOSPITAL_COMMUNITY): Payer: Self-pay

## 2023-08-11 DIAGNOSIS — E282 Polycystic ovarian syndrome: Secondary | ICD-10-CM | POA: Diagnosis not present

## 2023-08-11 DIAGNOSIS — Z31430 Encounter of female for testing for genetic disease carrier status for procreative management: Secondary | ICD-10-CM | POA: Diagnosis not present

## 2023-08-11 DIAGNOSIS — Q5128 Other doubling of uterus, other specified: Secondary | ICD-10-CM | POA: Diagnosis not present

## 2023-08-11 DIAGNOSIS — Z3200 Encounter for pregnancy test, result unknown: Secondary | ICD-10-CM | POA: Diagnosis not present

## 2023-08-11 DIAGNOSIS — Z4889 Encounter for other specified surgical aftercare: Secondary | ICD-10-CM | POA: Diagnosis not present

## 2023-08-11 DIAGNOSIS — N840 Polyp of corpus uteri: Secondary | ICD-10-CM | POA: Diagnosis not present

## 2023-08-11 DIAGNOSIS — Z113 Encounter for screening for infections with a predominantly sexual mode of transmission: Secondary | ICD-10-CM | POA: Diagnosis not present

## 2023-08-11 DIAGNOSIS — N981 Hyperstimulation of ovaries: Secondary | ICD-10-CM | POA: Diagnosis not present

## 2023-08-11 DIAGNOSIS — Z319 Encounter for procreative management, unspecified: Secondary | ICD-10-CM | POA: Diagnosis not present

## 2023-08-11 DIAGNOSIS — N970 Female infertility associated with anovulation: Secondary | ICD-10-CM | POA: Diagnosis not present

## 2023-08-18 ENCOUNTER — Other Ambulatory Visit (HOSPITAL_COMMUNITY): Payer: Self-pay

## 2023-08-18 ENCOUNTER — Other Ambulatory Visit (HOSPITAL_BASED_OUTPATIENT_CLINIC_OR_DEPARTMENT_OTHER): Payer: Self-pay | Admitting: Family Medicine

## 2023-08-19 ENCOUNTER — Other Ambulatory Visit (HOSPITAL_COMMUNITY): Payer: Self-pay

## 2023-08-19 ENCOUNTER — Encounter (HOSPITAL_BASED_OUTPATIENT_CLINIC_OR_DEPARTMENT_OTHER): Payer: Self-pay | Admitting: Family Medicine

## 2023-08-19 ENCOUNTER — Other Ambulatory Visit (HOSPITAL_BASED_OUTPATIENT_CLINIC_OR_DEPARTMENT_OTHER): Payer: Self-pay

## 2023-08-19 MED ORDER — ZEPBOUND 5 MG/0.5ML ~~LOC~~ SOAJ
5.0000 mg | SUBCUTANEOUS | 1 refills | Status: AC
Start: 2023-08-19 — End: 2023-10-14
  Filled 2023-08-19: qty 2, 28d supply, fill #0

## 2023-08-20 ENCOUNTER — Other Ambulatory Visit (HOSPITAL_COMMUNITY): Payer: Self-pay

## 2023-08-22 ENCOUNTER — Other Ambulatory Visit (HOSPITAL_COMMUNITY): Payer: Self-pay

## 2023-08-24 ENCOUNTER — Other Ambulatory Visit (HOSPITAL_COMMUNITY): Payer: Self-pay

## 2023-08-28 ENCOUNTER — Other Ambulatory Visit (HOSPITAL_COMMUNITY): Payer: Self-pay

## 2023-11-05 ENCOUNTER — Other Ambulatory Visit (HOSPITAL_COMMUNITY): Payer: Self-pay

## 2023-12-03 ENCOUNTER — Ambulatory Visit
Admission: EM | Admit: 2023-12-03 | Discharge: 2023-12-03 | Disposition: A | Discharge status: Home / Self Care | Payer: 59 | Attending: Family Medicine | Admitting: Family Medicine

## 2023-12-03 ENCOUNTER — Encounter (HOSPITAL_BASED_OUTPATIENT_CLINIC_OR_DEPARTMENT_OTHER): Payer: Self-pay | Admitting: Family Medicine

## 2023-12-03 ENCOUNTER — Telehealth: Payer: Commercial Managed Care - PPO | Admitting: Physician Assistant

## 2023-12-03 ENCOUNTER — Other Ambulatory Visit: Payer: Self-pay

## 2023-12-03 DIAGNOSIS — R197 Diarrhea, unspecified: Secondary | ICD-10-CM

## 2023-12-03 DIAGNOSIS — K529 Noninfective gastroenteritis and colitis, unspecified: Secondary | ICD-10-CM | POA: Diagnosis not present

## 2023-12-03 DIAGNOSIS — R34 Anuria and oliguria: Secondary | ICD-10-CM

## 2023-12-03 MED ORDER — ONDANSETRON 8 MG PO TBDP: 8.0000 mg | ORAL_TABLET | Freq: Three times a day (TID) | ORAL | 0 refills | Status: AC | PRN

## 2023-12-03 MED ORDER — LOPERAMIDE HCL 2 MG PO CAPS: 2.0000 mg | ORAL_CAPSULE | Freq: Two times a day (BID) | ORAL | 0 refills | Status: AC | PRN

## 2023-12-03 NOTE — Discharge Instructions (Addendum)

## 2023-12-03 NOTE — Progress Notes (Signed)
  Because of inability to pass much urine we are concerned about dehydration and possible injury to kidneys. As such, I feel your condition warrants further evaluation and I recommend that you be seen in a face-to-face visit.   NOTE: There will be NO CHARGE for this E-Visit   If you are having a true medical emergency, please call 911.     For an urgent face to face visit, Jacqueline [name] has multiple urgent care centers for your convenience.  Click the link below for the full list of locations and hours, walk-in wait times, appointment scheduling options and driving directions:  Urgent Care - [place], [place], [place], [place], [place], [place]  [place]     Your MyChart E-visit questionnaire answers were reviewed by a board certified advanced clinical practitioner to complete your personal care plan based on your specific symptoms.    Thank you for using e-Visits.

## 2023-12-03 NOTE — ED Provider Notes (Signed)
[facility]RGENT [facility]  Note:  This document was prepared using Conservation officer, historic buildings and may include unintentional dictation errors.  MRN: [id number] DOB: 07-01-1991  Subjective:   Jacqueline [name] is a 33 y.o. female presenting for 4-day history of acute onset persistent watery diarrhea with multiple episodes a day.  Has had general belly pain, nausea but no vomiting.  No bloody stools.  No fever, recent antibiotic use, hospitalizations or long distance travel.  Has not eaten raw foods, drank unfiltered water.  No history of GI disorders including Crohn's, IBS, ulcerative colitis.  She does work at a hospital.  No known exposures.  She is tolerating liquids and soups p.o.  No current facility-administered medications for this encounter.  Current Outpatient Medications:    tirzepatide (MOUNJARO) 2.5 MG/0.5ML Pen, Inject 2.5 mg into the skin once a week, Disp: 4 mL, Rfl: 1   tirzepatide (ZEPBOUND) 2.5 MG/0.5ML Pen, Inject 2.5 mg into the skin once a week., Disp: 2 mL, Rfl: 1   Allergies  Allergen Reactions   Colchicine Diarrhea and Other (See Comments)    DIARRHEA AND MOUTH SORES   Indomethacin Other (See Comments)    Blood in stool    Phentermine Rash    Past Medical History:  Diagnosis Date   Adenopathy, Left Cervical 2016   in setting of epstein-barr virus   Chronic low back pain    Complication of anesthesia    Female infertility    History of Epstein-Barr virus infection 2016   approx---  per pt resolved w/ no residual   History of kidney stones    History of pericarditis 04/2016   dx 2017 ED visit ;  folowed up w/ cardiology dr berry normal ETT and echo both in epic 05-07-2016   Migraines    Morbid (severe) obesity due to excess calories (HCC) 02/03/2017   PCOS (polycystic ovarian syndrome)    PONV (postoperative nausea and vomiting)    Retained placenta    SVD 03-12-2023   complicated w/ retained placenta PPH , bedside curretage and  blood transfusion x2;   scheduled for D&E for retained placenta 07-23-2023   Wears contact lenses      Past Surgical History:  Procedure Laterality Date   DILATION AND EVACUATION N/A 07/23/2023   Procedure: DILATATION AND EVACUATION;  Surgeon: Vick Frees, MD;  Location: [facility];  Service: Gynecology;  Laterality: N/A;   HYSTEROSCOPY WITH D & C     04/ 2023  and 07/ 2023   LYMPH NODE BIOPSY Left 12/11/2014   Procedure: LYMPH NODE BIOPSY;  Surgeon: Flo Shanks, MD;  Location:  SURGERY CENTER;  Service: ENT;  Laterality: Left;   OPERATIVE ULTRASOUND N/A 07/23/2023   Procedure: OPERATIVE ULTRASOUND;  Surgeon: Vick Frees, MD;  Location: [facility];  Service: Gynecology;  Laterality: N/A;   OVUM / OOCYTE RETRIEVAL  02/2022   UTERINE SEPTUM RESECTION  10/10/2022   @[facility]  by dr Rochel Brome;   via hysteroscopy   WISDOM TOOTH EXTRACTION      Family History  Problem Relation Age of Onset   Diabetes Mother    Hypertension Mother    High Cholesterol Mother    Thyroid disease Mother    Obesity Mother    Obesity Father    Cancer Maternal Grandmother        Hodgkin's Lymphoma   Cancer Paternal Grandmother 56       pancreatic, cervical   Diabetes  Paternal Grandmother    Heart disease Paternal Grandmother    Heart disease Paternal Grandfather    Polycystic ovary syndrome Paternal Aunt     Social History   Tobacco Use   Smoking status: Never   Smokeless tobacco: Never  Vaping Use   Vaping status: Never Used  Substance Use Topics   Alcohol use: Not Currently   Drug use: Never    ROS   Objective:   Vitals: BP 128/89 (BP Location: Left Arm)   Pulse 92   Temp (!) 97.4 F (36.3 C) (Tympanic)   Resp 18   LMP  (LMP Unknown)   SpO2 98%   Breastfeeding No   Physical Exam Constitutional:      General: She is not in acute distress.    Appearance: Normal appearance. She is well-developed. She is not ill-appearing,  toxic-appearing or diaphoretic.  HENT:     Head: Normocephalic and atraumatic.     Nose: Nose normal.     Mouth/Throat:     Mouth: Mucous membranes are moist.     Pharynx: Oropharynx is clear.  Eyes:     General: No scleral icterus.       Right eye: No discharge.        Left eye: No discharge.     Extraocular Movements: Extraocular movements intact.     Conjunctiva/sclera: Conjunctivae normal.  Cardiovascular:     Rate and Rhythm: Normal rate.  Pulmonary:     Effort: Pulmonary effort is normal.  Abdominal:     General: Bowel sounds are increased. There is no distension.     Palpations: Abdomen is soft. There is no mass.     Tenderness: There is no abdominal tenderness. There is no right CVA tenderness, left CVA tenderness, guarding or rebound.  Skin:    General: Skin is warm and dry.  Neurological:     General: No focal deficit present.     Mental Status: She is alert and oriented to person, place, and time.  Psychiatric:        Mood and Affect: Mood normal.        Behavior: Behavior normal.        Thought Content: Thought content normal.        Judgment: Judgment normal.     Assessment and Plan :   PDMP not reviewed this encounter.  1. Colitis   2. Diarrhea, unspecified type    GI panel and C. difficile testing pending.  Will manage for colitis with general supportive care.  Push fluids.  Low suspicion for dehydration or AKI.  Patient is to continue trying to hydrate as she has been.  Counseled patient on potential for adverse effects with medications prescribed/recommended today, ER and return-to-clinic precautions discussed, patient verbalized understanding.    Wallis Bamberg, [place] 12/03/23 1955

## 2023-12-03 NOTE — ED Triage Notes (Signed)
Patient C/O severe watery diarrhea x 4 days , generalized abdominal pain and nausea. Patient states > 8 episodes today.Patient states increased abdominal pain after eating.

## 2024-01-04 ENCOUNTER — Telehealth: Payer: 59 | Admitting: Family Medicine

## 2024-01-04 DIAGNOSIS — J069 Acute upper respiratory infection, unspecified: Secondary | ICD-10-CM

## 2024-01-04 MED ORDER — BENZONATATE 100 MG PO CAPS: 100.0000 mg | ORAL_CAPSULE | Freq: Three times a day (TID) | ORAL | 0 refills | Status: AC | PRN

## 2024-01-04 MED ORDER — PROMETHAZINE-DM 6.25-15 MG/5ML PO SYRP: 5.0000 mL | ORAL_SOLUTION | Freq: Four times a day (QID) | ORAL | 0 refills | Status: DC | PRN
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
## 2024-01-04 NOTE — Patient Instructions (Signed)
 Dot Been, thank you for joining Freddy Finner, NP for today's virtual visit.  While this provider is not your primary care provider (PCP), if your PCP is located in our provider database this encounter information will be shared with them immediately following your visit.   A [facility] MyChart account gives you access to today's visit and all your visits, tests, and labs performed at [facility] " click here if you don't have a [facility] MyChart account or go to [email or web address]  Consent: (Patient) Jacqueline [name] provided verbal consent for this virtual visit at the beginning of the encounter.  Current Medications:  Current Outpatient Medications:    benzonatate (TESSALON) 100 MG capsule, Take 1 capsule (100 mg total) by mouth 3 (three) times daily as needed for cough., Disp: 30 capsule, Rfl: 0   promethazine-dextromethorphan (PROMETHAZINE-DM) 6.25-15 MG/5ML syrup, Take 5 mLs by mouth 4 (four) times daily as needed for cough., Disp: 118 mL, Rfl: 0   loperamide (IMODIUM) 2 MG capsule, Take 1 capsule (2 mg total) by mouth 2 (two) times daily as needed for diarrhea or loose stools., Disp: 14 capsule, Rfl: 0   ondansetron (ZOFRAN-ODT) 8 MG disintegrating tablet, Take 1 tablet (8 mg total) by mouth every 8 (eight) hours as needed for nausea or vomiting., Disp: 20 tablet, Rfl: 0   tirzepatide (MOUNJARO) 2.5 MG/0.5ML Pen, Inject 2.5 mg into the skin once a week, Disp: 4 mL, Rfl: 1   tirzepatide (ZEPBOUND) 2.5 MG/0.5ML Pen, Inject 2.5 mg into the skin once a week., Disp: 2 mL, Rfl: 1   Medications ordered in this encounter:  Meds ordered this encounter  Medications   promethazine-dextromethorphan (PROMETHAZINE-DM) 6.25-15 MG/5ML syrup    Sig: Take 5 mLs by mouth 4 (four) times daily as needed for cough.    Dispense:  118 mL    Refill:  0    Supervising Provider:   Merrilee Jansky [[id number]]   benzonatate (TESSALON) 100 MG capsule    Sig: Take 1 capsule  (100 mg total) by mouth 3 (three) times daily as needed for cough.    Dispense:  30 capsule    Refill:  0    Supervising Provider:   Merrilee Jansky [[id number]]     *If you need refills on other medications prior to your next appointment, please contact your pharmacy*  Follow-Up: Call back or seek an in-person evaluation if the symptoms worsen or if the condition fails to improve as anticipated.  [facility] [phone]  Other Instructions  - URI recommendations: -if not improved by Friday/sat- advised on EV for bacterial infection - Increased rest - Increasing Fluids - Acetaminophen / ibuprofen as needed for fever/pain.  - Salt water gargling, chloraseptic spray and throat lozenges - Mucinex if mucus is present and increasing- an during the day only. - Saline nasal spray if congestion or if nasal passages feel dry. - Humidifying the air.     If you have been instructed to have an in-person evaluation today at a local Urgent Care facility, please use the link below. It will take you to a list of all of our available [facility], including address, phone number and hours of operation. Please do not delay care.  [facility]  If you or a family member do not have a primary care provider, use the link below to schedule a visit and establish care. When you choose a [facility] primary care physician or  advanced practice provider, you gain a long-term partner in health. Find a Primary Care Provider  Learn more about [facility]'s in-office and virtual care options: [facility] - Get Care Now

## 2024-01-08 ENCOUNTER — Telehealth: Payer: 59 | Admitting: Physician Assistant

## 2024-01-08 DIAGNOSIS — J329 Chronic sinusitis, unspecified: Secondary | ICD-10-CM | POA: Diagnosis not present

## 2024-01-08 MED ORDER — DOXYCYCLINE HYCLATE 100 MG PO CAPS: 100.0000 mg | ORAL_CAPSULE | Freq: Two times a day (BID) | ORAL | 0 refills | Status: AC

## 2024-01-08 MED ORDER — FLUTICASONE PROPIONATE 50 MCG/ACT NA SUSP: 2.0000 | Freq: Every day | NASAL | 0 refills | Status: DC

## 2024-01-08 NOTE — Patient Instructions (Signed)
 Dot Been, thank you for joining Karrie Meres, PA-C for today's virtual visit.  While this provider is not your primary care provider (PCP), if your PCP is located in our provider database this encounter information will be shared with them immediately following your visit.   A [facility] MyChart account gives you access to today's visit and all your visits, tests, and labs performed at [facility] " click here if you don't have a [facility] MyChart account or go to [email or web address]  Consent: (Patient) Jacqueline [name] provided verbal consent for this virtual visit at the beginning of the encounter.  Current Medications:  Current Outpatient Medications:    doxycycline (VIBRAMYCIN) 100 MG capsule, Take 1 capsule (100 mg total) by mouth 2 (two) times daily for 7 days., Disp: 14 capsule, Rfl: 0   fluticasone (FLONASE) 50 MCG/ACT nasal spray, Place 2 sprays into both nostrils daily., Disp: 16 g, Rfl: 0   benzonatate (TESSALON) 100 MG capsule, Take 1 capsule (100 mg total) by mouth 3 (three) times daily as needed for cough., Disp: 30 capsule, Rfl: 0   loperamide (IMODIUM) 2 MG capsule, Take 1 capsule (2 mg total) by mouth 2 (two) times daily as needed for diarrhea or loose stools., Disp: 14 capsule, Rfl: 0   ondansetron (ZOFRAN-ODT) 8 MG disintegrating tablet, Take 1 tablet (8 mg total) by mouth every 8 (eight) hours as needed for nausea or vomiting., Disp: 20 tablet, Rfl: 0   promethazine-dextromethorphan (PROMETHAZINE-DM) 6.25-15 MG/5ML syrup, Take 5 mLs by mouth 4 (four) times daily as needed for cough., Disp: 118 mL, Rfl: 0   tirzepatide (MOUNJARO) 2.5 MG/0.5ML Pen, Inject 2.5 mg into the skin once a week, Disp: 4 mL, Rfl: 1   tirzepatide (ZEPBOUND) 2.5 MG/0.5ML Pen, Inject 2.5 mg into the skin once a week., Disp: 2 mL, Rfl: 1   Medications ordered in this encounter:  Meds ordered this encounter  Medications   doxycycline (VIBRAMYCIN) 100 MG capsule     Sig: Take 1 capsule (100 mg total) by mouth 2 (two) times daily for 7 days.    Dispense:  14 capsule    Refill:  0   fluticasone (FLONASE) 50 MCG/ACT nasal spray    Sig: Place 2 sprays into both nostrils daily.    Dispense:  16 g    Refill:  0     *If you need refills on other medications prior to your next appointment, please contact your pharmacy*  Follow-Up: Call back or seek an in-person evaluation if the symptoms worsen or if the condition fails to improve as anticipated.  [facility] [phone]  Other Instructions You were given a prescription for antibiotics. Please take the antibiotic prescription fully.   Follow up with your regular doctor in 1 week for reassessment and seek care sooner if your symptoms worsen or fail to improve.   If you have been instructed to have an in-person evaluation today at a local Urgent Care facility, please use the link below. It will take you to a list of all of our available [facility], including address, phone number and hours of operation. Please do not delay care.  [facility]  If you or a family member do not have a primary care provider, use the link below to schedule a visit and establish care. When you choose a  primary care physician or advanced practice provider, you gain a long-term partner in health. Find a Primary  Care Provider  Learn more about [facility]'s in-office and virtual care options: [facility] - Get Care Now

## 2024-01-08 NOTE — Progress Notes (Signed)
 Ms. Jacqueline [name] are scheduled for a virtual visit with your provider today.    Just as we do with appointments in the office, we must obtain your consent to participate.  Your consent will be active for this visit and any virtual visit you may have with one of our providers in the next 365 days.    If you have a MyChart account, I can also send a copy of this consent to you electronically.  All virtual visits are billed to your insurance company just like a traditional visit in the office.  As this is a virtual visit, video technology does not allow for your provider to perform a traditional examination.  This may limit your provider's ability to fully assess your condition.  If your provider identifies any concerns that need to be evaluated in person or the need to arrange testing such as labs, EKG, etc, we will make arrangements to do so.    Although advances in technology are sophisticated, we cannot ensure that it will always work on either your end or our end.  If the connection with a video visit is poor, we may have to switch to a telephone visit.  With either a video or telephone visit, we are not always able to ensure that we have a secure connection.   I need to obtain your verbal consent now.   Are you willing to proceed with your visit today?   Jacqueline [name] has provided verbal consent on 01/08/2024 for a virtual visit (video or telephone).   Karrie Meres, PA-C 01/08/2024  10:10 AM   Date:  01/08/2024   ID:  Jacqueline [name], Erney 06-07-1991, MRN [id number]  Patient Location: Home Provider Location: Home Office   Participants: Patient and Provider for Visit and Wrap up  Method of visit: Video  Location of Patient: Home Location of Provider: Home Office Consent was obtain for visit over the video. Services rendered by provider: Visit was performed via video  A video enabled telemedicine application was used and I verified that I am speaking with the correct person using  two identifiers.  PCP:  [place], Raymond J, MD   Chief Complaint:  URI  History of Present Illness:    Jacqueline [name] is a 33 y.o. female with history as stated below. Presents video telehealth for an acute care visit  Pt reports cough, congestion, rhinorrhea for the last week. She was seen earlier this week and was diagnosed with a viral URI. She has had continues nasal congestion, ear fullness and has had no improvement of symptoms despite OTC medications. Denies fevers. Reports recent sick contacts, daughter ill with similar sxs. Was placed on abx however was not swabbed.   Past Medical, Surgical, Social History, Allergies, and Medications have been Reviewed.  Past Medical History:  Diagnosis Date   Adenopathy, Left Cervical 2016   in setting of epstein-barr virus   Chronic low back pain    Complication of anesthesia    Female infertility    History of Epstein-Barr virus infection 2016   approx---  per pt resolved w/ no residual   History of kidney stones    History of pericarditis 04/2016   dx 2017 ED visit ;  folowed up w/ cardiology dr berry normal ETT and echo both in epic 05-07-2016   Migraines    Morbid (severe) obesity due to excess calories (HCC) 02/03/2017   PCOS (polycystic ovarian syndrome)    PONV (postoperative nausea and vomiting)  Retained placenta    SVD 03-12-2023   complicated w/ retained placenta PPH , bedside curretage and blood transfusion x2;   scheduled for D&E for retained placenta 07-23-2023   Wears contact lenses     Current Meds  Medication Sig   doxycycline (VIBRAMYCIN) 100 MG capsule Take 1 capsule (100 mg total) by mouth 2 (two) times daily for 7 days.   fluticasone (FLONASE) 50 MCG/ACT nasal spray Place 2 sprays into both nostrils daily.     Allergies:   Colchicine, Indomethacin, and Phentermine   ROS See HPI for history of present illness.  Physical Exam Constitutional:      Appearance: Normal appearance. She is not  ill-appearing.  Pulmonary:     Comments: No respiratory distress. Speaking in full sentences.  Neurological:     Mental Status: She is alert.              MDM:  Pt with persistent sinus sxs and cough for >7 days. Suspect bacterial sinusitis. Also considered mycoplasma pneumonia in setting of recent outbreak. Will give rx for doxycycline and fluticasone. Advised on plan for follow up     Tests Ordered: No orders of the defined types were placed in this encounter.   Medication Changes: Meds ordered this encounter  Medications   doxycycline (VIBRAMYCIN) 100 MG capsule    Sig: Take 1 capsule (100 mg total) by mouth 2 (two) times daily for 7 days.    Dispense:  14 capsule    Refill:  0   fluticasone (FLONASE) 50 MCG/ACT nasal spray    Sig: Place 2 sprays into both nostrils daily.    Dispense:  16 g    Refill:  0     Disposition:  Follow up  Signed, Jacqueline [name] Manfred Shirts, PA-C  01/08/2024 10:10 AM

## 2024-02-26 ENCOUNTER — Telehealth: Admitting: Family Medicine

## 2024-02-26 DIAGNOSIS — J069 Acute upper respiratory infection, unspecified: Secondary | ICD-10-CM | POA: Diagnosis not present

## 2024-02-26 DIAGNOSIS — J40 Bronchitis, not specified as acute or chronic: Secondary | ICD-10-CM

## 2024-02-26 MED ORDER — AZITHROMYCIN 250 MG PO TABS: ORAL_TABLET | ORAL | 0 refills | Status: AC

## 2024-02-26 MED ORDER — BENZONATATE 200 MG PO CAPS: 200.0000 mg | ORAL_CAPSULE | Freq: Two times a day (BID) | ORAL | 0 refills | Status: AC | PRN

## 2024-02-26 NOTE — Progress Notes (Signed)

## 2024-03-24 ENCOUNTER — Telehealth: Admitting: Physician Assistant

## 2024-03-24 DIAGNOSIS — J208 Acute bronchitis due to other specified organisms: Secondary | ICD-10-CM

## 2024-03-24 DIAGNOSIS — B9689 Other specified bacterial agents as the cause of diseases classified elsewhere: Secondary | ICD-10-CM | POA: Diagnosis not present

## 2024-03-24 MED ORDER — PROMETHAZINE-DM 6.25-15 MG/5ML PO SYRP: 5.0000 mL | ORAL_SOLUTION | Freq: Four times a day (QID) | ORAL | 0 refills | Status: AC | PRN

## 2024-03-24 MED ORDER — DOXYCYCLINE HYCLATE 100 MG PO TABS: 100.0000 mg | ORAL_TABLET | Freq: Two times a day (BID) | ORAL | 0 refills | Status: AC

## 2024-03-24 MED ORDER — PREDNISONE 20 MG PO TABS: 40.0000 mg | ORAL_TABLET | Freq: Every day | ORAL | 0 refills | Status: AC
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
## 2024-03-24 NOTE — Patient Instructions (Signed)
 Jacqueline [name], thank you for joining Jacqueline Kelp, PA-C for today's virtual visit.  While this provider is not your primary care provider (PCP), if your PCP is located in our provider database this encounter information will be shared with them immediately following your visit.   A [facility] MyChart account gives you access to today's visit and all your visits, tests, and labs performed at [facility] " click here if you don't have a [facility] MyChart account or go to [email or web address]  Consent: (Patient) Jacqueline [name] provided verbal consent for this virtual visit at the beginning of the encounter.  Current Medications:  Current Outpatient Medications:    doxycycline  (VIBRA -TABS) 100 MG tablet, Take 1 tablet (100 mg total) by mouth 2 (two) times daily., Disp: 14 tablet, Rfl: 0   predniSONE  (DELTASONE ) 20 MG tablet, Take 2 tablets (40 mg total) by mouth daily with breakfast., Disp: 10 tablet, Rfl: 0   promethazine -dextromethorphan (PROMETHAZINE -DM) 6.25-15 MG/5ML syrup, Take 5 mLs by mouth 4 (four) times daily as needed., Disp: 118 mL, Rfl: 0   benzonatate  (TESSALON ) 100 MG capsule, Take 1 capsule (100 mg total) by mouth 3 (three) times daily as needed for cough., Disp: 30 capsule, Rfl: 0   benzonatate  (TESSALON ) 200 MG capsule, Take 1 capsule (200 mg total) by mouth 2 (two) times daily as needed for cough., Disp: 20 capsule, Rfl: 0   fluticasone  (FLONASE ) 50 MCG/ACT nasal spray, Place 2 sprays into both nostrils daily., Disp: 16 g, Rfl: 0   loperamide  (IMODIUM ) 2 MG capsule, Take 1 capsule (2 mg total) by mouth 2 (two) times daily as needed for diarrhea or loose stools., Disp: 14 capsule, Rfl: 0   ondansetron  (ZOFRAN -ODT) 8 MG disintegrating tablet, Take 1 tablet (8 mg total) by mouth every 8 (eight) hours as needed for nausea or vomiting., Disp: 20 tablet, Rfl: 0   tirzepatide  (MOUNJARO ) 2.5 MG/0.5ML Pen, Inject 2.5 mg into the skin once a week, Disp: 4  mL, Rfl: 1   tirzepatide  (ZEPBOUND ) 2.5 MG/0.5ML Pen, Inject 2.5 mg into the skin once a week., Disp: 2 mL, Rfl: 1   Medications ordered in this encounter:  Meds ordered this encounter  Medications   doxycycline  (VIBRA -TABS) 100 MG tablet    Sig: Take 1 tablet (100 mg total) by mouth 2 (two) times daily.    Dispense:  14 tablet    Refill:  0    Supervising Provider:   Corine Dice [[id number]]   promethazine -dextromethorphan (PROMETHAZINE -DM) 6.25-15 MG/5ML syrup    Sig: Take 5 mLs by mouth 4 (four) times daily as needed.    Dispense:  118 mL    Refill:  0    Supervising Provider:   Corine Dice [[id number]]   predniSONE  (DELTASONE ) 20 MG tablet    Sig: Take 2 tablets (40 mg total) by mouth daily with breakfast.    Dispense:  10 tablet    Refill:  0    Supervising Provider:   Corine Dice [[id number]]     *If you need refills on other medications prior to your next appointment, please contact your pharmacy*  Follow-Up: Call back or seek an in-person evaluation if the symptoms worsen or if the condition fails to improve as anticipated.  [facility] [phone]  Other Instructions Acute Bronchitis, Adult  Acute bronchitis is sudden inflammation of the main airways (bronchi) that come off the windpipe (trachea) in the lungs. The swelling causes the airways to get  smaller and make more mucus than normal. This can make it hard to breathe and can cause coughing or noisy breathing (wheezing). Acute bronchitis may last several weeks. The cough may last longer. Allergies, asthma, and exposure to smoke may make the condition worse. What are the causes? This condition can be caused by germs and by substances that irritate the lungs, including: Cold and flu viruses. The most common cause of this condition is the virus that causes the common cold. Bacteria. This is less common. Breathing in substances that irritate the lungs, including: Smoke from cigarettes and other  forms of tobacco. Dust and pollen. Fumes from household cleaning products, gases, or burned fuel. Indoor or outdoor air pollution. What increases the risk? The following factors may make you more likely to develop this condition: A weak body's defense system, also called the immune system. A condition that affects your lungs and breathing, such as asthma. What are the signs or symptoms? Common symptoms of this condition include: Coughing. This may bring up clear, yellow, or green mucus from your lungs (sputum). Wheezing. Runny or stuffy nose. Having too much mucus in your lungs (chest congestion). Shortness of breath. Aches and pains, including sore throat or chest. How is this diagnosed? This condition is usually diagnosed based on: Your symptoms and medical history. A physical exam. You may also have other tests, including tests to rule out other conditions, such as pneumonia. These tests include: A test of lung function. Test of a mucus sample to look for the presence of bacteria. Tests to check the oxygen level in your blood. Blood tests. Chest X-ray. How is this treated? Most cases of acute bronchitis clear up over time without treatment. Your health care provider may recommend: Drinking more fluids to help thin your mucus so it is easier to cough up. Taking inhaled medicine (inhaler) to improve air flow in and out of your lungs. Using a vaporizer or a humidifier. These are machines that add water to the air to help you breathe better. Taking a medicine that thins mucus and clears congestion (expectorant). Taking a medicine that prevents or stops coughing (cough suppressant). It is not common to take an antibiotic medicine for this condition. Follow these instructions at home:  Take over-the-counter and prescription medicines only as told by your health care provider. Use an inhaler, vaporizer, or humidifier as told by your health care provider. Take two teaspoons (10 mL)  of honey at bedtime to lessen coughing at night. Drink enough fluid to keep your urine pale yellow. Do not use any products that contain nicotine or tobacco. These products include cigarettes, chewing tobacco, and vaping devices, such as e-cigarettes. If you need help quitting, ask your health care provider. Get plenty of rest. Return to your normal activities as told by your health care provider. Ask your health care provider what activities are safe for you. Keep all follow-up visits. This is important. How is this prevented? To lower your risk of getting this condition again: Wash your hands often with soap and water for at least 20 seconds. If soap and water are not available, use hand sanitizer. Avoid contact with people who have cold symptoms. Try not to touch your mouth, nose, or eyes with your hands. Avoid breathing in smoke or chemical fumes. Breathing smoke or chemical fumes will make your condition worse. Get the flu shot every year. Contact a health care provider if: Your symptoms do not improve after 2 weeks. You have trouble coughing up the  mucus. Your cough keeps you awake at night. You have a fever. Get help right away if you: Cough up blood. Feel pain in your chest. Have severe shortness of breath. Faint or keep feeling like you are going to faint. Have a severe headache. Have a fever or chills that get worse. These symptoms may represent a serious problem that is an emergency. Do not wait to see if the symptoms will go away. Get medical help right away. Call your local emergency services (911 in the [place] Do not drive yourself to the hospital. Summary Acute bronchitis is inflammation of the main airways (bronchi) that come off the windpipe (trachea) in the lungs. The swelling causes the airways to get smaller and make more mucus than normal. Drinking more fluids can help thin your mucus so it is easier to cough up. Take over-the-counter and prescription medicines only  as told by your health care provider. Do not use any products that contain nicotine or tobacco. These products include cigarettes, chewing tobacco, and vaping devices, such as e-cigarettes. If you need help quitting, ask your health care provider. Contact a health care provider if your symptoms do not improve after 2 weeks. This information is not intended to replace advice given to you by your health care provider. Make sure you discuss any questions you have with your health care provider. Document Revised: 02/06/2022 Document Reviewed: 02/27/2021 Elsevier Patient Education  2024 Elsevier Inc.   If you have been instructed to have an in-person evaluation today at a local Urgent Care facility, please use the link below. It will take you to a list of all of our available [facility], including address, phone number and hours of operation. Please do not delay care.  [facility]  If you or a family member do not have a primary care provider, use the link below to schedule a visit and establish care. When you choose a [facility] primary care physician or advanced practice provider, you gain a long-term partner in health. Find a Primary Care Provider  Learn more about [facility]'s in-office and virtual care options: [facility] - Get Care Now

## 2024-10-15 ENCOUNTER — Telehealth: Admitting: Family Medicine

## 2024-10-15 DIAGNOSIS — B9689 Other specified bacterial agents as the cause of diseases classified elsewhere: Secondary | ICD-10-CM

## 2024-10-15 DIAGNOSIS — J329 Chronic sinusitis, unspecified: Secondary | ICD-10-CM

## 2024-10-15 MED ORDER — FLUTICASONE PROPIONATE 50 MCG/ACT NA SUSP: 2.0000 | Freq: Every day | NASAL | 0 refills | Status: AC

## 2024-10-15 MED ORDER — AMOXICILLIN-POT CLAVULANATE 875-125 MG PO TABS: 1.0000 | ORAL_TABLET | Freq: Two times a day (BID) | ORAL | 0 refills | Status: AC

## 2024-10-15 NOTE — Patient Instructions (Signed)
 Nat Carloyn Likes, thank you for joining Roosvelt Mater, PA-C for today's virtual visit.  While this provider is not your primary care provider (PCP), if your PCP is located in our provider database this encounter information will be shared with them immediately following your visit.   A [facility] MyChart account gives you access to today's visit and all your visits, tests, and labs performed at [facility]  click here if you don't have a [facility] MyChart account or go to [email or web address]  Consent: (Patient) Jacqueline [name] provided verbal consent for this virtual visit at the beginning of the encounter.  Current Medications:  Current Outpatient Medications:    amoxicillin -clavulanate (AUGMENTIN ) 875-125 MG tablet, Take 1 tablet by mouth 2 (two) times daily for 7 days., Disp: 14 tablet, Rfl: 0   benzonatate  (TESSALON ) 100 MG capsule, Take 1 capsule (100 mg total) by mouth 3 (three) times daily as needed for cough., Disp: 30 capsule, Rfl: 0   benzonatate  (TESSALON ) 200 MG capsule, Take 1 capsule (200 mg total) by mouth 2 (two) times daily as needed for cough., Disp: 20 capsule, Rfl: 0   doxycycline  (VIBRA -TABS) 100 MG tablet, Take 1 tablet (100 mg total) by mouth 2 (two) times daily., Disp: 14 tablet, Rfl: 0   fluticasone  (FLONASE ) 50 MCG/ACT nasal spray, Place 2 sprays into both nostrils daily., Disp: 16 g, Rfl: 0   loperamide  (IMODIUM ) 2 MG capsule, Take 1 capsule (2 mg total) by mouth 2 (two) times daily as needed for diarrhea or loose stools., Disp: 14 capsule, Rfl: 0   ondansetron  (ZOFRAN -ODT) 8 MG disintegrating tablet, Take 1 tablet (8 mg total) by mouth every 8 (eight) hours as needed for nausea or vomiting., Disp: 20 tablet, Rfl: 0   predniSONE  (DELTASONE ) 20 MG tablet, Take 2 tablets (40 mg total) by mouth daily with breakfast., Disp: 10 tablet, Rfl: 0   promethazine -dextromethorphan (PROMETHAZINE -DM) 6.25-15 MG/5ML syrup, Take 5 mLs by mouth 4 (four) times  daily as needed., Disp: 118 mL, Rfl: 0   tirzepatide  (MOUNJARO ) 2.5 MG/0.5ML Pen, Inject 2.5 mg into the skin once a week, Disp: 4 mL, Rfl: 1   tirzepatide  (ZEPBOUND ) 2.5 MG/0.5ML Pen, Inject 2.5 mg into the skin once a week., Disp: 2 mL, Rfl: 1   Medications ordered in this encounter:  Meds ordered this encounter  Medications   amoxicillin -clavulanate (AUGMENTIN ) 875-125 MG tablet    Sig: Take 1 tablet by mouth 2 (two) times daily for 7 days.    Dispense:  14 tablet    Refill:  0   fluticasone  (FLONASE ) 50 MCG/ACT nasal spray    Sig: Place 2 sprays into both nostrils daily.    Dispense:  16 g    Refill:  0     *If you need refills on other medications prior to your next appointment, please contact your pharmacy*  Follow-Up: Call back or seek an in-person evaluation if the symptoms worsen or if the condition fails to improve as anticipated.  [facility] [phone]  Other Instructions Sinus Infection, Adult A sinus infection, also called sinusitis, is inflammation of your sinuses. Sinuses are hollow spaces in the bones around your face. Your sinuses are located: Around your eyes. In the middle of your forehead. Behind your nose. In your cheekbones. Mucus normally drains out of your sinuses. When your nasal tissues become inflamed or swollen, mucus can become trapped or blocked. This allows bacteria, viruses, and fungi to grow, which leads to infection. Most infections  of the sinuses are caused by a virus. A sinus infection can develop quickly. It can last for up to 4 weeks (acute) or for more than 12 weeks (chronic). A sinus infection often develops after a cold. What are the causes? This condition is caused by anything that creates swelling in the sinuses or stops mucus from draining. This includes: Allergies. Asthma. Infection from bacteria or viruses. Deformities or blockages in your nose or sinuses. Abnormal growths in the nose (nasal polyps). Pollutants,  such as chemicals or irritants in the air. Infection from fungi. This is rare. What increases the risk? You are more likely to develop this condition if you: Have a weak body defense system (immune system). Do a lot of swimming or diving. Overuse nasal sprays. Smoke. What are the signs or symptoms? The main symptoms of this condition are pain and a feeling of pressure around the affected sinuses. Other symptoms include: Stuffy nose or congestion that makes it difficult to breathe through your nose. Thick yellow or greenish drainage from your nose. Tenderness, swelling, and warmth over the affected sinuses. A cough that may get worse at night. Decreased sense of smell and taste. Extra mucus that collects in the throat or the back of the nose (postnasal drip) causing a sore throat or bad breath. Tiredness (fatigue). Fever. How is this diagnosed? This condition is diagnosed based on: Your symptoms. Your medical history. A physical exam. Tests to find out if your condition is acute or chronic. This may include: Checking your nose for nasal polyps. Viewing your sinuses using a device that has a light (endoscope). Testing for allergies or bacteria. Imaging tests, such as an MRI or CT scan. In rare cases, a bone biopsy may be done to rule out more serious types of fungal sinus disease. How is this treated? Treatment for a sinus infection depends on the cause and whether your condition is chronic or acute. If caused by a virus, your symptoms should go away on their own within 10 days. You may be given medicines to relieve symptoms. They include: Medicines that shrink swollen nasal passages (decongestants). A spray that eases inflammation of the nostrils (topical intranasal corticosteroids). Rinses that help get rid of thick mucus in your nose (nasal saline washes). Medicines that treat allergies (antihistamines). Over-the-counter pain relievers. If caused by bacteria, your health care  provider may recommend waiting to see if your symptoms improve. Most bacterial infections will get better without antibiotic medicine. You may be given antibiotics if you have: A severe infection. A weak immune system. If caused by narrow nasal passages or nasal polyps, surgery may be needed. Follow these instructions at home: Medicines Take, use, or apply over-the-counter and prescription medicines only as told by your health care provider. These may include nasal sprays. If you were prescribed an antibiotic medicine, take it as told by your health care provider. Do not stop taking the antibiotic even if you start to feel better. Hydrate and humidify  Drink enough fluid to keep your urine pale yellow. Staying hydrated will help to thin your mucus. Use a cool mist humidifier to keep the humidity level in your home above 50%. Inhale steam for 10-15 minutes, 3-4 times a day, or as told by your health care provider. You can do this in the bathroom while a hot shower is running. Limit your exposure to cool or dry air. Rest Rest as much as possible. Sleep with your head raised (elevated). Make sure you get enough sleep each night.  General instructions  Apply a warm, moist washcloth to your face 3-4 times a day or as told by your health care provider. This will help with discomfort. Use nasal saline washes as often as told by your health care provider. Wash your hands often with soap and water to reduce your exposure to germs. If soap and water are not available, use hand sanitizer. Do not smoke. Avoid being around people who are smoking (secondhand smoke). Keep all follow-up visits. This is important. Contact a health care provider if: You have a fever. Your symptoms get worse. Your symptoms do not improve within 10 days. Get help right away if: You have a severe headache. You have persistent vomiting. You have severe pain or swelling around your face or eyes. You have vision  problems. You develop confusion. Your neck is stiff. You have trouble breathing. These symptoms may be an emergency. Get help right away. Call 911. Do not wait to see if the symptoms will go away. Do not drive yourself to the hospital. Summary A sinus infection is soreness and inflammation of your sinuses. Sinuses are hollow spaces in the bones around your face. This condition is caused by nasal tissues that become inflamed or swollen. The swelling traps or blocks the flow of mucus. This allows bacteria, viruses, and fungi to grow, which leads to infection. If you were prescribed an antibiotic medicine, take it as told by your health care provider. Do not stop taking the antibiotic even if you start to feel better. Keep all follow-up visits. This is important. This information is not intended to replace advice given to you by your health care provider. Make sure you discuss any questions you have with your health care provider. Document Revised: 10/01/2021 Document Reviewed: 10/01/2021 Elsevier Patient Education  2024 [employer] Inc.   If you have been instructed to have an in-person evaluation today at a local Urgent Care facility, please use the link below. It will take you to a list of all of our available [facility], including address, phone number and hours of operation. Please do not delay care.  [facility]  If you or a family member do not have a primary care provider, use the link below to schedule a visit and establish care. When you choose a [facility] primary care physician or advanced practice provider, you gain a long-term partner in health. Find a Primary Care Provider  Learn more about [facility]'s in-office and virtual care options: [facility] - Get Care Now
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
